# Patient Record
Sex: Female | Born: 1990 | Race: Black or African American | Hispanic: No | Smoking: Current every day smoker
Health system: Southern US, Community
[De-identification: ages and names within clinical notes are randomized; demographics above are authoritative.]

## PROBLEM LIST (undated history)

## (undated) DIAGNOSIS — F259 Schizoaffective disorder, unspecified: Secondary | ICD-10-CM

## (undated) DIAGNOSIS — D573 Sickle-cell trait: Secondary | ICD-10-CM

## (undated) DIAGNOSIS — F419 Anxiety disorder, unspecified: Secondary | ICD-10-CM

## (undated) DIAGNOSIS — R011 Cardiac murmur, unspecified: Secondary | ICD-10-CM

## (undated) DIAGNOSIS — F209 Schizophrenia, unspecified: Secondary | ICD-10-CM

## (undated) DIAGNOSIS — F909 Attention-deficit hyperactivity disorder, unspecified type: Secondary | ICD-10-CM

## (undated) DIAGNOSIS — B009 Herpesviral infection, unspecified: Secondary | ICD-10-CM

---
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
## 2010-03-10 ENCOUNTER — Inpatient Hospital Stay (HOSPITAL_COMMUNITY)
Admission: AD | Admit: 2010-03-10 | Discharge: 2010-03-12 | Payer: Self-pay | Source: Home / Self Care | Attending: Obstetrics and Gynecology | Admitting: Obstetrics and Gynecology

## 2010-03-29 ENCOUNTER — Emergency Department (HOSPITAL_COMMUNITY)
Admission: EM | Admit: 2010-03-29 | Discharge: 2010-03-29 | Payer: Self-pay | Source: Home / Self Care | Admitting: Emergency Medicine

## 2010-04-02 LAB — CULTURE, ROUTINE-ABSCESS

## 2010-05-20 LAB — CBC
HCT: 28.1 % — ABNORMAL LOW (ref 36.0–46.0)
HCT: 31.6 % — ABNORMAL LOW (ref 36.0–46.0)
Hemoglobin: 11.3 g/dL — ABNORMAL LOW (ref 12.0–15.0)
MCH: 30.7 pg (ref 26.0–34.0)
MCH: 31.3 pg (ref 26.0–34.0)
MCHC: 35.8 g/dL (ref 30.0–36.0)
MCV: 87.5 fL (ref 78.0–100.0)
MCV: 88.1 fL (ref 78.0–100.0)
Platelets: 174 10*3/uL (ref 150–400)
RDW: 13.6 % (ref 11.5–15.5)
RDW: 13.9 % (ref 11.5–15.5)
WBC: 15.7 10*3/uL — ABNORMAL HIGH (ref 4.0–10.5)
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
## 2010-05-28 LAB — URINALYSIS, ROUTINE W REFLEX MICROSCOPIC
Bilirubin Urine: NEGATIVE
Hgb urine dipstick: NEGATIVE
Ketones, ur: 40 mg/dL — AB
Nitrite: NEGATIVE
Specific Gravity, Urine: 1.016 (ref 1.005–1.030)
Urobilinogen, UA: 1 mg/dL (ref 0.0–1.0)
pH: 5.5 (ref 5.0–8.0)

## 2010-05-28 LAB — COMPREHENSIVE METABOLIC PANEL
BUN: 3 mg/dL — ABNORMAL LOW (ref 6–23)
CO2: 25 mEq/L (ref 19–32)
Calcium: 9.1 mg/dL (ref 8.4–10.5)
Chloride: 105 mEq/L (ref 96–112)
Creatinine, Ser: 0.74 mg/dL (ref 0.4–1.2)
GFR calc non Af Amer: >60 mL/min (ref >60)
Total Bilirubin: 0.6 mg/dL (ref 0.3–1.2)

## 2010-05-28 LAB — CBC
HCT: 39.3 % (ref 36.0–46.0)
MCHC: 33.7 g/dL (ref 30.0–36.0)
MCV: 93.7 fL (ref 78.0–100.0)
RBC: 4.2 MIL/uL (ref 3.87–5.11)
WBC: 7.4 10*3/uL (ref 4.0–10.5)

## 2010-05-28 LAB — DIFFERENTIAL
Basophils Absolute: 0 10*3/uL (ref 0.0–0.1)
Lymphocytes Relative: 11 % — ABNORMAL LOW (ref 12–46)
Lymphs Abs: 0.8 10*3/uL (ref 0.7–4.0)
Neutro Abs: 5.6 10*3/uL (ref 1.7–7.7)
Neutrophils Relative %: 76 % (ref 43–77)

## 2010-05-28 LAB — LIPASE, BLOOD: Lipase: 22 U/L (ref 11–59)

## 2010-05-28 LAB — HCG, QUANTITATIVE, PREGNANCY: hCG, Beta Chain, Quant, S: 241 m[IU]/mL — ABNORMAL HIGH (ref <5)

## 2010-06-03 LAB — CBC
Hemoglobin: 14 g/dL (ref 12.0–15.0)
MCV: 96.3 fL (ref 78.0–100.0)
RBC: 4.37 MIL/uL (ref 3.87–5.11)
WBC: 4.7 10*3/uL (ref 4.0–10.5)

## 2010-06-03 LAB — URINALYSIS, ROUTINE W REFLEX MICROSCOPIC
Glucose, UA: NEGATIVE mg/dL
Ketones, ur: NEGATIVE mg/dL
Nitrite: NEGATIVE
Protein, ur: NEGATIVE mg/dL
Urobilinogen, UA: 0.2 mg/dL (ref 0.0–1.0)

## 2010-06-03 LAB — COMPREHENSIVE METABOLIC PANEL
ALT: 16 U/L (ref 0–35)
AST: 20 U/L (ref 0–37)
CO2: 26 mEq/L (ref 19–32)
Chloride: 101 mEq/L (ref 96–112)
Creatinine, Ser: 0.65 mg/dL (ref 0.4–1.2)
GFR calc Af Amer: >60 mL/min (ref >60)
GFR calc non Af Amer: >60 mL/min (ref >60)
Sodium: 137 mEq/L (ref 135–145)
Total Bilirubin: 1 mg/dL (ref 0.3–1.2)

## 2010-06-03 LAB — DIFFERENTIAL
Basophils Absolute: 0 10*3/uL (ref 0.0–0.1)
Eosinophils Absolute: 0.1 10*3/uL (ref 0.0–0.7)
Eosinophils Relative: 1 % (ref 0–5)

## 2010-06-03 LAB — RAPID URINE DRUG SCREEN, HOSP PERFORMED: Benzodiazepines: NOT DETECTED

## 2010-06-03 LAB — LIPASE, BLOOD: Lipase: 19 U/L (ref 11–59)

## 2010-06-03 LAB — PREGNANCY, URINE: Preg Test, Ur: NEGATIVE

## 2010-12-02 LAB — URINALYSIS, ROUTINE W REFLEX MICROSCOPIC
Bilirubin Urine: NEGATIVE
Ketones, ur: NEGATIVE
Nitrite: NEGATIVE
Protein, ur: NEGATIVE
Urobilinogen, UA: 1

## 2010-12-04 LAB — POCT PREGNANCY, URINE
Operator id: 294501
Preg Test, Ur: NEGATIVE

## 2010-12-04 LAB — URINALYSIS, ROUTINE W REFLEX MICROSCOPIC
Bilirubin Urine: NEGATIVE
Glucose, UA: NEGATIVE
Ketones, ur: NEGATIVE
pH: 5.5

## 2010-12-04 LAB — URINE CULTURE: Colony Count: 30000

## 2010-12-10 LAB — GC/CHLAMYDIA PROBE AMP, GENITAL
Chlamydia, DNA Probe: NEGATIVE
GC Probe Amp, Genital: NEGATIVE

## 2011-04-09 ENCOUNTER — Encounter (HOSPITAL_COMMUNITY): Payer: Self-pay | Admitting: Emergency Medicine

## 2011-04-09 ENCOUNTER — Emergency Department (HOSPITAL_COMMUNITY)
Admission: EM | Admit: 2011-04-09 | Discharge: 2011-04-09 | Disposition: A | Discharge status: Home / Self Care | Payer: Medicaid Other | Attending: Emergency Medicine | Admitting: Emergency Medicine

## 2011-04-09 ENCOUNTER — Emergency Department (HOSPITAL_COMMUNITY): Payer: Medicaid Other

## 2011-04-09 DIAGNOSIS — N949 Unspecified condition associated with female genital organs and menstrual cycle: Secondary | ICD-10-CM | POA: Insufficient documentation

## 2011-04-09 DIAGNOSIS — R102 Pelvic and perineal pain: Secondary | ICD-10-CM

## 2011-04-09 DIAGNOSIS — F411 Generalized anxiety disorder: Secondary | ICD-10-CM | POA: Insufficient documentation

## 2011-04-09 DIAGNOSIS — R1032 Left lower quadrant pain: Secondary | ICD-10-CM | POA: Insufficient documentation

## 2011-04-09 DIAGNOSIS — F17200 Nicotine dependence, unspecified, uncomplicated: Secondary | ICD-10-CM | POA: Insufficient documentation

## 2011-04-09 DIAGNOSIS — B9689 Other specified bacterial agents as the cause of diseases classified elsewhere: Secondary | ICD-10-CM | POA: Insufficient documentation

## 2011-04-09 DIAGNOSIS — A499 Bacterial infection, unspecified: Secondary | ICD-10-CM | POA: Insufficient documentation

## 2011-04-09 DIAGNOSIS — N760 Acute vaginitis: Secondary | ICD-10-CM | POA: Insufficient documentation

## 2011-04-09 HISTORY — DX: Anxiety disorder, unspecified: F41.9

## 2011-04-09 LAB — WET PREP, GENITAL: Trich, Wet Prep: NONE SEEN

## 2011-04-09 LAB — CBC
HCT: 38.1 % (ref 36.0–46.0)
Hemoglobin: 13.4 g/dL (ref 12.0–15.0)
MCH: 30.9 pg (ref 26.0–34.0)
MCHC: 35.2 g/dL (ref 30.0–36.0)

## 2011-04-09 LAB — BASIC METABOLIC PANEL
BUN: 8 mg/dL (ref 6–23)
Chloride: 100 mEq/L (ref 96–112)
Creatinine, Ser: 0.68 mg/dL (ref 0.50–1.10)
GFR calc Af Amer: >90 mL/min (ref >90)
Glucose, Bld: 93 mg/dL (ref 70–99)

## 2011-04-09 LAB — DIFFERENTIAL
Basophils Relative: 0 % (ref 0–1)
Eosinophils Absolute: 0.2 10*3/uL (ref 0.0–0.7)
Monocytes Absolute: 0.8 10*3/uL (ref 0.1–1.0)
Monocytes Relative: 11 % (ref 3–12)
Neutro Abs: 3.1 10*3/uL (ref 1.7–7.7)

## 2011-04-09 MED ORDER — HYDROCODONE-ACETAMINOPHEN 5-325 MG PO TABS
1.0000 | ORAL_TABLET | Freq: Once | ORAL | Status: AC
  Administered 2011-04-09: 1 via ORAL
  Filled 2011-04-09: qty 1

## 2011-04-09 MED ORDER — HYDROCODONE-ACETAMINOPHEN 5-325 MG PO TABS: 1.0000 | ORAL_TABLET | ORAL | Status: AC | PRN

## 2011-04-09 MED ORDER — METRONIDAZOLE 500 MG PO TABS: 500.0000 mg | ORAL_TABLET | Freq: Two times a day (BID) | ORAL | Status: AC

## 2011-04-09 NOTE — ED Notes (Signed)
Pt states she thinks her IUD is in the wrong spot  Pt states she is having pain and she feels it scratching her  Pt states it hurts to walk and sit and move around  Sxs started today but she states it has never felt right since she got it

## 2011-04-09 NOTE — ED Provider Notes (Signed)
History     CSN: [id number]  Arrival date & time 04/09/11  0038   First MD Initiated Contact with Patient 04/09/11 0350      No chief complaint on file.   (Consider location/radiation/quality/duration/timing/severity/associated sxs/prior treatment) HPI Comments: Patient here with complaints that she "thinks her IUD is out of place" - she states that it was placed in January of 2012, states that she has had pelvic pain since it was placed and she feels like she can feel it poking her on the left side of her body - she states that she has not seen her GYN because everytime she calls there the doctor is not there so she never made an appointment.  She reports she is also have a foul smelling discharge that started about 2 weeks ago - she denies fever, chills, reports she is sexually active with same partner - denies nausea, vomiting,   Patient is a 21 y.o. female presenting with female genitourinary complaint. The history is provided by the patient. No language interpreter was used.  Female GU Problem Primary symptoms include discharge, pelvic pain and genital odor.  Primary symptoms include no dyspareunia, no genital lesions, no genital pain, no genital rash, no genital itching, no dysuria and no vaginal bleeding. There has been no fever. This is a chronic problem. The current episode started more than 1 week ago. The problem occurs constantly. The problem has not changed since onset.She is not pregnant. She has not missed her period. Her LMP was weeks ago. The patient's menstrual history has been regular. The discharge was white, thin and malodorous. Associated symptoms include abdominal pain. Pertinent negatives include no anorexia, no abdominal swelling, no constipation, no diarrhea, no nausea, no vomiting, no frequency, no light-headedness and no dizziness. She has tried nothing for the symptoms. The treatment provided no relief. Sexual activity: sexually active. She uses an IUD for contraception.     Past Medical History  Diagnosis Date  . Anxiety     History reviewed. No pertinent past surgical history.  Family History  Problem Relation Age of Onset  . Sickle cell anemia Mother     History  Substance Use Topics  . Smoking status: Current Everyday Smoker -- 1.0 packs/day    Types: Cigarettes  . Smokeless tobacco: Not on file  . Alcohol Use: No    OB History    Grav Para Term Preterm Abortions TAB SAB Ect Mult Living                  Review of Systems  Gastrointestinal: Positive for abdominal pain. Negative for nausea, vomiting, diarrhea, constipation and anorexia.  Genitourinary: Positive for pelvic pain. Negative for dysuria, frequency, vaginal bleeding and dyspareunia.  Neurological: Negative for dizziness and light-headedness.  All other systems reviewed and are negative.    Allergies  Review of patient's allergies indicates no known allergies.  Home Medications  No current outpatient prescriptions on file.  BP 120/74  Pulse 81  Temp(Src) 98.1 F (36.7 C) (Oral)  Resp 18  SpO2 100%  Physical Exam  Nursing note and vitals reviewed. Constitutional: She is oriented to person, place, and time. She appears well-developed and well-nourished. No distress.  HENT:  Head: Normocephalic and atraumatic.  Right Ear: External ear normal.  Left Ear: External ear normal.  Nose: Nose normal.  Mouth/Throat: Oropharynx is clear and moist. No oropharyngeal exudate.  Eyes: Conjunctivae are normal. Pupils are equal, round, and reactive to light. No scleral icterus.  Neck: Normal  range of motion. Neck supple.  Cardiovascular: Normal rate, regular rhythm and normal heart sounds.  Exam reveals no gallop and no friction rub.   No murmur heard. Pulmonary/Chest: Effort normal and breath sounds normal. She exhibits no tenderness.  Abdominal: Soft. Bowel sounds are normal. She exhibits no distension. There is no tenderness.  Genitourinary: Uterus normal. There is no rash,  tenderness or lesion on the right labia. There is no rash, tenderness or lesion on the left labia. Uterus is not enlarged and not tender. Cervix exhibits discharge. Cervix exhibits no motion tenderness and no friability. Right adnexum displays no mass and no tenderness. Left adnexum displays tenderness. Left adnexum displays no mass. No tenderness or bleeding around the vagina. Vaginal discharge found.       IUD string visible in the vaginal vault.  Musculoskeletal: Normal range of motion.  Lymphadenopathy:    She has no cervical adenopathy.  Neurological: She is alert and oriented to person, place, and time. No cranial nerve deficit.  Skin: Skin is warm and dry. No rash noted. No erythema. No pallor.  Psychiatric: She has a normal mood and affect. Her behavior is normal. Judgment and thought content normal.    ED Course  Procedures (including critical care time)  Labs Reviewed  DIFFERENTIAL - Abnormal; Notable for the following:    Neutrophils Relative 41 (*)    All other components within normal limits  WET PREP, GENITAL - Abnormal; Notable for the following:    Clue Cells Wet Prep HPF POC MODERATE (*)    WBC, Wet Prep HPF POC FEW (*)    All other components within normal limits  CBC  BASIC METABOLIC PANEL  GC/CHLAMYDIA PROBE AMP, GENITAL   US Transvaginal Non-ob  04/09/2011  *RADIOLOGY REPORT*  Clinical Data: Left lower quadrant pain.  Query placement of IUD.  TRANSABDOMINAL AND TRANSVAGINAL ULTRASOUND OF PELVIS Technique:  Both transabdominal and transvaginal ultrasound examinations of the pelvis were performed. Transabdominal technique was performed for global imaging of the pelvis including uterus, ovaries, adnexal regions, and pelvic cul-de-sac.  Comparison: 07/29/2007   It was necessary to proceed with endovaginal exam following the transabdominal exam to visualize the the ovaries and endometrium.  Findings:  Uterus: The uterus is anteverted and measures 8.9 x 4 x 5.5 cm.  No focal  myometrial masses.  Endometrium: Endometrial device appears to be appropriately positioned within endometrial cavity.  Endometrial stripe thickness is normal, measuring about 2 mm.  No abnormal endometrial fluid collections.  Right ovary:  Right ovary measures 4.2 x 2 x 2.2 cm and contains normal follicular changes.  Color flow Doppler images demonstrate flow in the right ovary.  Left ovary: The left ovary measures 3.8 x 3 x 3.3 cm.  Color flow Doppler images demonstrate flow in the left ovary.  Small amount of free fluid around the left adnexal region.  Other findings: Small amount of free fluid around the left adnexal region.  IMPRESSION: Intrauterine device in place.  Otherwise normal appearance of the uterus and ovaries.  Small amount of free fluid in the left adnexal region.  Original Report Authenticated By: Marlon Pel, M.D.   US Pelvis Complete  04/09/2011  *RADIOLOGY REPORT*  Clinical Data: Left lower quadrant pain.  Query placement of IUD.  TRANSABDOMINAL AND TRANSVAGINAL ULTRASOUND OF PELVIS Technique:  Both transabdominal and transvaginal ultrasound examinations of the pelvis were performed. Transabdominal technique was performed for global imaging of the pelvis including uterus, ovaries, adnexal regions, and pelvic cul-de-sac.  Comparison: 07/29/2007   It was necessary to proceed with endovaginal exam following the transabdominal exam to visualize the the ovaries and endometrium.  Findings:  Uterus: The uterus is anteverted and measures 8.9 x 4 x 5.5 cm.  No focal myometrial masses.  Endometrium: Endometrial device appears to be appropriately positioned within endometrial cavity.  Endometrial stripe thickness is normal, measuring about 2 mm.  No abnormal endometrial fluid collections.  Right ovary:  Right ovary measures 4.2 x 2 x 2.2 cm and contains normal follicular changes.  Color flow Doppler images demonstrate flow in the right ovary.  Left ovary: The left ovary measures 3.8 x 3 x 3.3 cm.   Color flow Doppler images demonstrate flow in the left ovary.  Small amount of free fluid around the left adnexal region.  Other findings: Small amount of free fluid around the left adnexal region.  IMPRESSION: Intrauterine device in place.  Otherwise normal appearance of the uterus and ovaries.  Small amount of free fluid in the left adnexal region.  Original Report Authenticated By: Marlon Pel, M.D.     Pelvic pain IUD in place BV   MDM  Patient with a year history of pelvic pain and recent vaginal discharge - I do not suspect PID in the patient and have confirmed placement of the IUD, have sent cultures as well, but again she was more tender at the left adnexa.  I have discharged her home with pain control, flagyl and for her to follow up with her GYN at the next available appointment/        Scarlette Calico C. [place], [place] 04/09/11 [phone]

## 2011-04-09 NOTE — ED Notes (Signed)
Patient transported to [facility] 

## 2011-04-09 NOTE — ED Provider Notes (Signed)
Medical screening examination/treatment/procedure(s) were performed by non-physician practitioner and as supervising physician I was immediately available for consultation/collaboration.  Loren Racer, MD 04/09/11 [phone]

## 2011-04-11 NOTE — ED Notes (Signed)
+   GC + Chlamydia Chart sent to EDP office for review.

## 2011-04-12 NOTE — ED Notes (Addendum)
Chart returned to from  EDP office. Azithromax 1g po x1 Suprax 400 mg po x1 Written by Purnell Shoemaker need to be called to pharmacy.

## 2011-04-14 NOTE — ED Notes (Signed)
Unable to contact via phone  Letter sent to [employer].

## 2011-04-18 NOTE — ED Notes (Signed)
Pt called and ID was verified x 2. Pt then given results and instructions.  Pt told me to call in Rx Azithromycin 1gram PO and Suprax 400mg  PO, both x 1 dose, to [facility] on [address] at [phone].  Pt verbalized understanding.

## 2011-05-24 ENCOUNTER — Emergency Department (HOSPITAL_COMMUNITY)
Admission: EM | Admit: 2011-05-24 | Discharge: 2011-05-25 | Disposition: A | Discharge status: Other Acute Inpatient Hospital | Payer: Medicaid Other | Attending: Emergency Medicine | Admitting: Emergency Medicine

## 2011-05-24 ENCOUNTER — Encounter (HOSPITAL_COMMUNITY): Payer: Self-pay | Admitting: *Deleted

## 2011-05-24 DIAGNOSIS — F17200 Nicotine dependence, unspecified, uncomplicated: Secondary | ICD-10-CM | POA: Insufficient documentation

## 2011-05-24 DIAGNOSIS — F29 Unspecified psychosis not due to a substance or known physiological condition: Secondary | ICD-10-CM

## 2011-05-24 DIAGNOSIS — F3289 Other specified depressive episodes: Secondary | ICD-10-CM | POA: Insufficient documentation

## 2011-05-24 DIAGNOSIS — F329 Major depressive disorder, single episode, unspecified: Secondary | ICD-10-CM | POA: Insufficient documentation

## 2011-05-24 DIAGNOSIS — F411 Generalized anxiety disorder: Secondary | ICD-10-CM | POA: Insufficient documentation

## 2011-05-24 LAB — CBC
HCT: 38.3 % (ref 36.0–46.0)
Hemoglobin: 13.9 g/dL (ref 12.0–15.0)
MCH: 31.9 pg (ref 26.0–34.0)
MCHC: 36.3 g/dL — ABNORMAL HIGH (ref 30.0–36.0)
MCV: 87.8 fL (ref 78.0–100.0)
RDW: 13.1 % (ref 11.5–15.5)

## 2011-05-24 LAB — ETHANOL: Alcohol, Ethyl (B): <11 mg/dL (ref 0–11)

## 2011-05-24 LAB — COMPREHENSIVE METABOLIC PANEL
ALT: 14 U/L (ref 0–35)
AST: 16 U/L (ref 0–37)
Albumin: 4.1 g/dL (ref 3.5–5.2)
Alkaline Phosphatase: 57 U/L (ref 39–117)
Glucose, Bld: 96 mg/dL (ref 70–99)
Potassium: 3.8 mEq/L (ref 3.5–5.1)
Sodium: 136 mEq/L (ref 135–145)
Total Protein: 7.3 g/dL (ref 6.0–8.3)

## 2011-05-24 LAB — RAPID URINE DRUG SCREEN, HOSP PERFORMED
Barbiturates: NOT DETECTED
Tetrahydrocannabinol: POSITIVE — AB

## 2011-05-24 NOTE — ED Provider Notes (Addendum)
History     CSN: [id number]  Arrival date & time 05/24/11  2033   First MD Initiated Contact with Patient 05/24/11 2116     Chief complaint: depressed, hearing voices   (Consider location/radiation/quality/duration/timing/severity/associated sxs/prior treatment) The history is provided by the patient.  pt states hx bipolar disorder and ptsd. States in past couple weeks hearing voices, 'lucifer' telling her to harm other people. Denies any definite plan or attempt at harm to self or others. States has been ver depressed. No si. No visual hallucinations. States otherwise, physical health at baseline. Denies etoh abuse. Pt states normal appetite. No nvd. No pain. No headaches. No fever or chills.   Past Medical History  Diagnosis Date  . Anxiety     No past surgical history on file.  Family History  Problem Relation Age of Onset  . Sickle cell anemia Mother     History  Substance Use Topics  . Smoking status: Current Everyday Smoker -- 1.0 packs/day    Types: Cigarettes  . Smokeless tobacco: Not on file  . Alcohol Use: No    OB History    Grav Para Term Preterm Abortions TAB SAB Ect Mult Living                  Review of Systems  Constitutional: Negative for fever.  HENT: Negative for neck pain.   Eyes: Negative for visual disturbance.  Respiratory: Negative for shortness of breath.   Cardiovascular: Negative for chest pain.  Gastrointestinal: Negative for abdominal pain.  Genitourinary: Negative for flank pain.  Musculoskeletal: Negative for back pain.  Skin: Negative for rash.  Neurological: Negative for headaches.  Hematological: Does not bruise/bleed easily.  Psychiatric/Behavioral: Negative for confusion.    Allergies  Review of patient's allergies indicates no known allergies.  Home Medications   Current Outpatient Rx  Name Route Sig Dispense Refill  . ACETAMINOPHEN 500 MG PO TABS Oral Take 500 mg by mouth every 6 (six) hours as needed. For pain    .  IBUPROFEN 200 MG PO CAPS Oral Take 200 mg by mouth 2 (two) times daily as needed. For cramps/pain      BP 115/73  Pulse 93  Temp(Src) 98.9 F (37.2 C) (Oral)  Resp 16  SpO2 100%  Physical Exam  Nursing note and vitals reviewed. Constitutional: She is oriented to person, place, and time. She appears well-developed and well-nourished. No distress.  HENT:  Head: Atraumatic.  Eyes: Conjunctivae are normal. Pupils are equal, round, and reactive to light. No scleral icterus.  Neck: Neck supple. No tracheal deviation present.  Cardiovascular: Normal rate, regular rhythm, normal heart sounds and intact distal pulses.   Pulmonary/Chest: Effort normal and breath sounds normal. No respiratory distress.  Abdominal: Soft. Normal appearance and bowel sounds are normal. She exhibits no distension. There is no tenderness.  Musculoskeletal: She exhibits no edema.  Neurological: She is alert and oriented to person, place, and time.       Steady gait. Motor intact bil  Skin: Skin is warm and dry. No rash noted.  Psychiatric:       Depressed mood. Flat affect. Admits to auditory hallucinations. No si.     ED Course  Procedures (including critical care time)   Labs Reviewed  COMPREHENSIVE METABOLIC PANEL  CBC  URINE RAPID DRUG SCREEN (HOSP PERFORMED)  ETHANOL  PREGNANCY, URINE    Results for orders placed during the hospital encounter of 05/24/11  COMPREHENSIVE METABOLIC PANEL  Component Value Range   Sodium 136  135 - 145 (mEq/L)   Potassium 3.8  3.5 - 5.1 (mEq/L)   Chloride 101  96 - 112 (mEq/L)   CO2 26  19 - 32 (mEq/L)   Glucose, Bld 96  70 - 99 (mg/dL)   BUN 7  6 - 23 (mg/dL)   Creatinine, Ser 1.61  0.50 - 1.10 (mg/dL)   Calcium 9.2  8.4 - 09.6 (mg/dL)   Total Protein 7.3  6.0 - 8.3 (g/dL)   Albumin 4.1  3.5 - 5.2 (g/dL)   AST 16  0 - 37 (U/L)   ALT 14  0 - 35 (U/L)   Alkaline Phosphatase 57  39 - 117 (U/L)   Total Bilirubin 1.1  0.3 - 1.2 (mg/dL)   GFR calc non Af Amer  >90  >90 (mL/min)   GFR calc Af Amer >90  >90 (mL/min)  CBC      Component Value Range   WBC 6.1  4.0 - 10.5 (K/uL)   RBC 4.36  3.87 - 5.11 (MIL/uL)   Hemoglobin 13.9  12.0 - 15.0 (g/dL)   HCT 04.5  40.9 - 81.1 (%)   MCV 87.8  78.0 - 100.0 (fL)   MCH 31.9  26.0 - 34.0 (pg)   MCHC 36.3 (*) 30.0 - 36.0 (g/dL)   RDW 91.4  78.2 - 95.6 (%)   Platelets 231  150 - 400 (K/uL)  URINE RAPID DRUG SCREEN (HOSP PERFORMED)      Component Value Range   Opiates NONE DETECTED  NONE DETECTED    Cocaine NONE DETECTED  NONE DETECTED    Benzodiazepines NONE DETECTED  NONE DETECTED    Amphetamines NONE DETECTED  NONE DETECTED    Tetrahydrocannabinol POSITIVE (*) NONE DETECTED    Barbiturates NONE DETECTED  NONE DETECTED   ETHANOL      Component Value Range   Alcohol, Ethyl (B) <11  0 - 11 (mg/dL)  PREGNANCY, URINE      Component Value Range   Preg Test, Ur NEGATIVE  NEGATIVE     MDM  Labs. Pt w security from [facility], they indicate pt to be taken back there once eval and med clearance labs back.    Nursing to fax labs to [facility], and contact them re accepting pt back to them for placement. Anticipate being able to d/c back to [facility] (via their security service who is with patient in the ED, pt cooperative/agreeable.   Charge rn has spoken with [facility] - they have accepted for return there. Will d/c to [facility] for psych placement, is medically stable/clear for discharge from ed.      Suzi Roots, MD 05/24/11 2130  Suzi Roots, MD 05/24/11 [phone]

## 2011-05-24 NOTE — ED Notes (Signed)
PT presents w/ IVC papers from [facility] for medical clearance. Pt pleasant, A&O x 3 at present time, c/o delusional behavior in 2 week cycles.

## 2011-05-25 NOTE — Discharge Instructions (Signed)
Return to [facility] via [facility] [facility].

## 2011-07-14 ENCOUNTER — Encounter (HOSPITAL_COMMUNITY): Payer: Self-pay | Admitting: Emergency Medicine

## 2011-07-14 ENCOUNTER — Emergency Department (HOSPITAL_COMMUNITY)
Admission: EM | Admit: 2011-07-14 | Discharge: 2011-07-15 | Payer: Medicaid Other | Attending: Emergency Medicine | Admitting: Emergency Medicine

## 2011-07-14 DIAGNOSIS — R443 Hallucinations, unspecified: Secondary | ICD-10-CM

## 2011-07-14 DIAGNOSIS — F411 Generalized anxiety disorder: Secondary | ICD-10-CM | POA: Insufficient documentation

## 2011-07-14 DIAGNOSIS — F29 Unspecified psychosis not due to a substance or known physiological condition: Secondary | ICD-10-CM | POA: Insufficient documentation

## 2011-07-14 DIAGNOSIS — F17200 Nicotine dependence, unspecified, uncomplicated: Secondary | ICD-10-CM | POA: Insufficient documentation

## 2011-07-14 DIAGNOSIS — R011 Cardiac murmur, unspecified: Secondary | ICD-10-CM | POA: Insufficient documentation

## 2011-07-14 DIAGNOSIS — D573 Sickle-cell trait: Secondary | ICD-10-CM | POA: Insufficient documentation

## 2011-07-14 HISTORY — DX: Cardiac murmur, unspecified: R01.1

## 2011-07-14 HISTORY — DX: Sickle-cell trait: D57.3

## 2011-07-14 NOTE — ED Notes (Signed)
Pt sent here from [facility] for medical clearance  Pt is to return to [facility] once cleared as she is waiting placement at [facility]

## 2011-07-15 NOTE — ED Notes (Signed)
Information faxed to [facility] and notified it had been sent

## 2011-07-15 NOTE — Discharge Instructions (Signed)
You have been evaluated today and cleared of any concerning medical conditions. Please followup at [facility] as planned for continued treatment of your hallucinations.   Hallucinations and Delusions You seem to be having hallucinations and/or delusions. You may be hearing voices that no one else can hear. This can seem very real to you. You may be having thoughts and fears that do not make sense to others. This condition can be due to mental disease like schizophrenia. It may be caused by a medical condition, such as an infection or electrolyte disturbance. These symptoms are also seen in drug abusers, especially those who use crack cocaine and amphetamines. Drugs like PCP, LSD, MDMA, peyote, and psilocybin can also cause frightening hallucinations and loss of control. If your symptoms are due to drug abuse, your mental state should improve as the drug(s) leave your system. Someone you trust should be with you until you are better to protect you and calm your fears. Often tranquilizers are very helpful at controlling hallucinations, anxiety, and destructive behavior. Getting a proper diet and enough sleep is important to recovery. If your symptoms are not due to drugs, or do not improve over several days after stopping drug use, you need further medical or mental health care. SEEK IMMEDIATE MEDICAL CARE IF:   Your symptoms get worse, especially if you think your life is in danger   You have violent or destructive thoughts.  Recovery is possible, but you have to get proper treatment and avoid drugs that are known to cause you trouble. Document Released: 04/03/2004 Document Revised: 02/13/2011 Document Reviewed: 02/24/2005 [facility] Patient Information 2012 [place], [place].   RESOURCE GUIDE  Dental Problems  Patients with Medicaid: [facility] [phone]                                           [id number] [employer] Phone:  [phone]                                                   Phone:  [phone]  If unable to pay or uninsured, contact:  [facility] or [facility]. to become qualified for the adult dental clinic.  Chronic Pain Problems Contact [facility]  [phone] Patients need to be referred by their primary care doctor.  Insufficient Money for Medicine Contact United Way:  call "211" or [facility] (470)446-0473.  No Primary Care Doctor Call [facility]  [phone] Other agencies that provide inexpensive medical care    [facility]  [phone]    [facility]  [phone]    [facility]  [phone]    [facility]  [phone]    Planned Parenthood  3104224744    [facility]  [phone]  Psychological Services [facility]  [phone] [facility]  [phone] [facility]   [phone] (emergency services [phone])  Substance Abuse Resources [facility]  651-722-1754 [facility] [phone] [facility] [place] [phone] Floydene Flock [phone] Residential & Outpatient Substance Abuse Program  [phone]  Abuse/Neglect [facility] Child Abuse Hotline (205)564-5605 [facility] Child Abuse Hotline 709-476-9520 (After Hours)  Emergency Shelter [facility] 9208484623  Maternity Homes [facility] [place] [facility] 989-144-0028 [facility] [phone]  MRSA Hotline #:   878-105-3301    [facility] Resources  [facility]     United Way                          [facility] Dept. [address] [place]                       [address]      [address]  Blondell Reveal Phone:  [phone]                                   Phone:  [phone]                 Phone:   [phone]  [facility] Phone:  [phone]  [facility] Child Abuse Hotline (848) 676-4994 (706)[phone] (After Hours)

## 2011-07-15 NOTE — ED Provider Notes (Signed)
Medical screening examination/treatment/procedure(s) were performed by non-physician practitioner and as supervising physician I was immediately available for consultation/collaboration.   Hanley Seamen, MD 07/15/11 2238

## 2011-07-15 NOTE — ED Provider Notes (Signed)
History     CSN: [id number]  Arrival date & time 07/14/11  2328   First MD Initiated Contact with Patient 07/15/11 0107      Chief Complaint  Patient presents with  . Medical Clearance   HPI  History provided by the patient and paperwork from [facility] Level V applies secondary to patient uncooperativeness. Patient is a 21 year old female with history of anxiety who presents for medical clearance. Patient was seen earlier today at [facility] for complaints of auditory and visual hallucinations. History is limited secondary to patient cooperation. Patient does state that she was hearing and seeing things and they were causing her to do "things that she wasn't supposed to do", but patient would not elaborate. Patient also stated that my questioning was causing her "blood to boil" and become more angry. Patient denies any other significant medical history.    Past Medical History  Diagnosis Date  . Anxiety   . Heart murmur   . Sickle cell trait     History reviewed. No pertinent past surgical history.  Family History  Problem Relation Age of Onset  . Sickle cell anemia Mother     History  Substance Use Topics  . Smoking status: Current Everyday Smoker -- 1.0 packs/day    Types: Cigarettes  . Smokeless tobacco: Not on file  . Alcohol Use: Yes     daily    OB History    Grav Para Term Preterm Abortions TAB SAB Ect Mult Living                  Review of Systems Level V applies secondary to patient being uncooperative   Allergies  Zyprexa  Home Medications   Current Outpatient Rx  Name Route Sig Dispense Refill  . ACETAMINOPHEN 500 MG PO TABS Oral Take 500 mg by mouth every 6 (six) hours as needed. For pain    . ARIPIPRAZOLE 5 MG PO TABS Oral Take 5 mg by mouth daily.    Marland Kitchen DIVALPROEX SODIUM 500 MG PO TBEC Oral Take 500-1,000 mg by mouth See admin instructions. Takes 500 mg in the morning and 1000 mg at bedtime.    . IBUPROFEN 200 MG PO CAPS Oral Take 200 mg by mouth 2  (two) times daily as needed. For cramps/pain      BP 99/45  Pulse 84  Temp(Src) 100.5 F (38.1 C) (Oral)  Resp 20  SpO2 100%  Physical Exam  Nursing note and vitals reviewed. Constitutional: She appears well-developed and well-nourished. No distress.  HENT:  Head: Normocephalic.  Cardiovascular: Normal rate and regular rhythm.   Pulmonary/Chest: Effort normal and breath sounds normal.  Musculoskeletal: Normal range of motion.  Neurological: She is alert.  Skin: Skin is warm.  Psychiatric: She is agitated.    ED Course  Procedures   Results for orders placed during the hospital encounter of 07/14/11  PREGNANCY, URINE      Component Value Range   Preg Test, Ur NEGATIVE  NEGATIVE   VALPROIC ACID LEVEL      Component Value Range   Valproic Acid Lvl 24.7 (*) 50.0 - 100.0 (ug/mL)       1. Psychosis   2. Hallucinations       MDM  Patient seen and evaluated. Patient no acute distress. Patient uncooperative with history exam.  Patient is accepted at [facility] and can return after lab work.     Date: 07/15/2011  Rate: 87  Rhythm: normal sinus rhythm  QRS Axis: normal  Intervals: normal  ST/T Wave abnormalities: normal  Conduction Disutrbances:none  Narrative Interpretation:   Old EKG Reviewed: none available    Angus Seller, [place] 07/15/11 2032

## 2012-12-11 ENCOUNTER — Encounter (HOSPITAL_COMMUNITY): Payer: Self-pay | Admitting: *Deleted

## 2012-12-11 ENCOUNTER — Emergency Department (HOSPITAL_COMMUNITY)
Admission: EM | Admit: 2012-12-11 | Discharge: 2012-12-11 | Disposition: A | Discharge status: Home / Self Care | Payer: Medicaid Other | Attending: Emergency Medicine | Admitting: Emergency Medicine

## 2012-12-11 DIAGNOSIS — R011 Cardiac murmur, unspecified: Secondary | ICD-10-CM | POA: Insufficient documentation

## 2012-12-11 DIAGNOSIS — Z79899 Other long term (current) drug therapy: Secondary | ICD-10-CM | POA: Insufficient documentation

## 2012-12-11 DIAGNOSIS — F411 Generalized anxiety disorder: Secondary | ICD-10-CM | POA: Insufficient documentation

## 2012-12-11 DIAGNOSIS — Z3202 Encounter for pregnancy test, result negative: Secondary | ICD-10-CM | POA: Insufficient documentation

## 2012-12-11 DIAGNOSIS — F17200 Nicotine dependence, unspecified, uncomplicated: Secondary | ICD-10-CM | POA: Insufficient documentation

## 2012-12-11 DIAGNOSIS — T7421XA Adult sexual abuse, confirmed, initial encounter: Secondary | ICD-10-CM | POA: Insufficient documentation

## 2012-12-11 DIAGNOSIS — F259 Schizoaffective disorder, unspecified: Secondary | ICD-10-CM | POA: Insufficient documentation

## 2012-12-11 DIAGNOSIS — Z862 Personal history of diseases of the blood and blood-forming organs and certain disorders involving the immune mechanism: Secondary | ICD-10-CM | POA: Insufficient documentation

## 2012-12-11 HISTORY — DX: Schizoaffective disorder, unspecified: F25.9

## 2012-12-11 HISTORY — DX: Attention-deficit hyperactivity disorder, unspecified type: F90.9

## 2012-12-11 LAB — URINALYSIS, ROUTINE W REFLEX MICROSCOPIC
Bilirubin Urine: NEGATIVE
Specific Gravity, Urine: 1.011 (ref 1.005–1.030)
pH: 6.5 (ref 5.0–8.0)

## 2012-12-11 LAB — URINE MICROSCOPIC-ADD ON

## 2012-12-11 MED ORDER — LIDOCAINE HCL (PF) 1 % IJ SOLN
INTRAMUSCULAR | Status: AC
  Administered 2012-12-11: 0.9 mL
  Filled 2012-12-11: qty 5

## 2012-12-11 MED ORDER — CEFTRIAXONE SODIUM 250 MG IJ SOLR
250.0000 mg | Freq: Once | INTRAMUSCULAR | Status: AC
  Administered 2012-12-11: 250 mg via INTRAMUSCULAR
  Filled 2012-12-11: qty 250

## 2012-12-11 MED ORDER — AZITHROMYCIN 1 G PO PACK
1.0000 g | PACK | Freq: Once | ORAL | Status: AC
  Administered 2012-12-11: 1 g via ORAL
  Filled 2012-12-11: qty 1

## 2012-12-11 MED ORDER — AZITHROMYCIN 250 MG PO TABS
1000.0000 mg | ORAL_TABLET | Freq: Once | ORAL | Status: AC
  Administered 2012-12-11: 1000 mg via ORAL
  Filled 2012-12-11: qty 4

## 2012-12-11 NOTE — ED Notes (Signed)
Urine never collected. Accidentally clicked off.
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
## 2012-12-11 NOTE — ED Notes (Signed)
Pt. Had to use the restroom was unable to waite. She was told we need a urinalysis, and given a cup to go in. When she came back from the restroom she clamed she forgot to go in the cup.

## 2012-12-11 NOTE — ED Notes (Signed)
Sane Nurse called to examine the patinet.

## 2012-12-11 NOTE — ED Notes (Signed)
Pt signed her name Kahley Leib, reports "That's my stage name." "That's what my name gonna be." Discharge instructions reviewed with family at bedside, family verbalizes understanding.
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
## 2012-12-12 ENCOUNTER — Ambulatory Visit (HOSPITAL_COMMUNITY)
Admission: EM | Admit: 2012-12-12 | Discharge: 2012-12-12 | Disposition: A | Discharge status: Home / Self Care | Payer: Medicare Other | Attending: Psychiatry | Admitting: Psychiatry

## 2012-12-12 ENCOUNTER — Encounter (HOSPITAL_COMMUNITY): Payer: Self-pay | Admitting: Emergency Medicine

## 2012-12-12 ENCOUNTER — Emergency Department (HOSPITAL_COMMUNITY)
Admission: EM | Admit: 2012-12-12 | Discharge: 2012-12-22 | Disposition: A | Discharge status: Nursing Home (Includes ICF) | Payer: Medicaid Other | Attending: Emergency Medicine | Admitting: Emergency Medicine

## 2012-12-12 DIAGNOSIS — Z862 Personal history of diseases of the blood and blood-forming organs and certain disorders involving the immune mechanism: Secondary | ICD-10-CM | POA: Insufficient documentation

## 2012-12-12 DIAGNOSIS — F22 Delusional disorders: Secondary | ICD-10-CM | POA: Insufficient documentation

## 2012-12-12 DIAGNOSIS — R011 Cardiac murmur, unspecified: Secondary | ICD-10-CM | POA: Insufficient documentation

## 2012-12-12 DIAGNOSIS — F29 Unspecified psychosis not due to a substance or known physiological condition: Secondary | ICD-10-CM | POA: Insufficient documentation

## 2012-12-12 DIAGNOSIS — F259 Schizoaffective disorder, unspecified: Secondary | ICD-10-CM | POA: Insufficient documentation

## 2012-12-12 DIAGNOSIS — Z3202 Encounter for pregnancy test, result negative: Secondary | ICD-10-CM | POA: Insufficient documentation

## 2012-12-12 DIAGNOSIS — F17200 Nicotine dependence, unspecified, uncomplicated: Secondary | ICD-10-CM | POA: Insufficient documentation

## 2012-12-12 DIAGNOSIS — Z79899 Other long term (current) drug therapy: Secondary | ICD-10-CM | POA: Insufficient documentation

## 2012-12-12 DIAGNOSIS — R443 Hallucinations, unspecified: Secondary | ICD-10-CM

## 2012-12-12 DIAGNOSIS — F309 Manic episode, unspecified: Secondary | ICD-10-CM | POA: Diagnosis present

## 2012-12-12 LAB — ACETAMINOPHEN LEVEL: Acetaminophen (Tylenol), Serum: <15 ug/mL (ref 10–30)

## 2012-12-12 LAB — COMPREHENSIVE METABOLIC PANEL
Albumin: 3.6 g/dL (ref 3.5–5.2)
Alkaline Phosphatase: 94 U/L (ref 39–117)
BUN: 5 mg/dL — ABNORMAL LOW (ref 6–23)
Chloride: 98 mEq/L (ref 96–112)
Glucose, Bld: 102 mg/dL — ABNORMAL HIGH (ref 70–99)
Potassium: 3.2 mEq/L — ABNORMAL LOW (ref 3.5–5.1)
Total Bilirubin: 0.8 mg/dL (ref 0.3–1.2)

## 2012-12-12 LAB — ETHANOL: Alcohol, Ethyl (B): <11 mg/dL (ref 0–11)

## 2012-12-12 LAB — CBC
HCT: 37.4 % (ref 36.0–46.0)
Hemoglobin: 13.2 g/dL (ref 12.0–15.0)
RBC: 4.28 MIL/uL (ref 3.87–5.11)
RDW: 13.3 % (ref 11.5–15.5)
WBC: 7.1 10*3/uL (ref 4.0–10.5)

## 2012-12-12 MED ORDER — LORAZEPAM 1 MG PO TABS
1.0000 mg | ORAL_TABLET | Freq: Three times a day (TID) | ORAL | Status: DC | PRN
  Administered 2012-12-12 – 2012-12-18 (×11): 1 mg via ORAL
  Filled 2012-12-12 (×3): qty 1
  Filled 2012-12-12: qty 2
  Filled 2012-12-12 (×7): qty 1

## 2012-12-12 MED ORDER — ONDANSETRON HCL 4 MG PO TABS
4.0000 mg | ORAL_TABLET | Freq: Three times a day (TID) | ORAL | Status: DC | PRN
  Administered 2012-12-15 – 2012-12-19 (×2): 4 mg via ORAL
  Filled 2012-12-12 (×2): qty 1

## 2012-12-12 MED ORDER — ZOLPIDEM TARTRATE 5 MG PO TABS: 5.0000 mg | ORAL_TABLET | Freq: Every evening | ORAL | Status: DC | PRN

## 2012-12-12 MED ORDER — ARIPIPRAZOLE 5 MG PO TABS
5.0000 mg | ORAL_TABLET | Freq: Every day | ORAL | Status: DC
  Administered 2012-12-13 – 2012-12-18 (×6): 5 mg via ORAL
  Filled 2012-12-12 (×7): qty 1

## 2012-12-12 MED ORDER — ACETAMINOPHEN 325 MG PO TABS
650.0000 mg | ORAL_TABLET | ORAL | Status: DC | PRN
  Administered 2012-12-18 – 2012-12-21 (×5): 650 mg via ORAL
  Filled 2012-12-12 (×5): qty 2
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
## 2012-12-12 NOTE — ED Notes (Signed)
Pt walked to BR, pt removed clothing but became very preoccupied with "bombs" in the wall. Pt unable to follow instructions and give urine sample or put on paper scrubs. Pt led back to room 18 naked, pt then was able to get into scrubs. 1 pr blue jeans, 1 shirt, 1 pr black sunglasses and 1 white chain necklace

## 2012-12-13 DIAGNOSIS — F259 Schizoaffective disorder, unspecified: Secondary | ICD-10-CM

## 2012-12-13 DIAGNOSIS — F29 Unspecified psychosis not due to a substance or known physiological condition: Secondary | ICD-10-CM

## 2012-12-13 LAB — HEPATITIS PANEL, ACUTE
HCV Ab: NEGATIVE
Hep A IgM: NEGATIVE
Hep B C IgM: NEGATIVE
Hepatitis B Surface Ag: NEGATIVE

## 2012-12-13 LAB — URINE CULTURE

## 2012-12-13 LAB — RAPID URINE DRUG SCREEN, HOSP PERFORMED
Barbiturates: NOT DETECTED
Benzodiazepines: NOT DETECTED

## 2012-12-13 MED ORDER — POTASSIUM CHLORIDE CRYS ER 20 MEQ PO TBCR
40.0000 meq | EXTENDED_RELEASE_TABLET | Freq: Once | ORAL | Status: AC
  Administered 2012-12-13: 40 meq via ORAL
  Filled 2012-12-13: qty 2

## 2012-12-13 MED ORDER — DIPHENHYDRAMINE HCL 50 MG/ML IJ SOLN
50.0000 mg | Freq: Once | INTRAMUSCULAR | Status: AC
  Administered 2012-12-13: 50 mg via INTRAMUSCULAR
  Filled 2012-12-13: qty 1

## 2012-12-13 MED ORDER — LORAZEPAM 2 MG/ML IJ SOLN
2.0000 mg | Freq: Once | INTRAMUSCULAR | Status: AC
  Administered 2012-12-13: 2 mg via INTRAMUSCULAR
  Filled 2012-12-13: qty 1

## 2012-12-13 MED ORDER — ZIPRASIDONE MESYLATE 20 MG IM SOLR
10.0000 mg | Freq: Once | INTRAMUSCULAR | Status: AC
  Administered 2012-12-13: 10 mg via INTRAMUSCULAR
  Filled 2012-12-13: qty 20
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
## 2012-12-13 NOTE — BH Assessment (Addendum)
Per [facility]HT, Cyndie Mull cell [phone] office [phone]. Tobi Bastos is her Public affairs consultant.   Evette Cristal, [place] Assessment Counselor   Pt signed consent to release info form for Cyndie Mull and four of pt's friends and relatives. CSW will contact Tobi Bastos for collateral info.  Evette Cristal, [place] Assessment Counselor
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
## 2012-12-13 NOTE — Progress Notes (Signed)
Jamie, intake at [facility], denied pt due to aggressive behaviors.  Blain Pais, MHT/NS

## 2012-12-13 NOTE — Progress Notes (Signed)
Per TTS, patient signed consent to release information to Cyndie Mull. CSW spoke with Cyndie Mull who is her therapist from [facility], and is currently pt therapist. Per Ms. Caralee Ates, she assessed pt and brought her into the ED on a crisis call. Per Ms. Caralee Ates pt claimed to have been introduced to crack coaine, however UDS is only positive for marijuana. Per Ms. Caralee Ates pt is currently working with the substance abuse counselor at [facility]. Per Ms. Andrews patient has been living independently for the past 5 months, however is not sure when she left [facility].  Per Ms. Andrews patient has never exhibited these psychotic features with her within the past 5 months.   Catha Gosselin, LCSW [phone]  ED CSW .12/13/2012 1051am

## 2012-12-13 NOTE — Progress Notes (Signed)
Underwriter initiated bed placement by faxing off the following hospitals referrals including facesheet, labs, notes, assessment and IVC on behalf of the pt: 1)[facility] 3)[facility] 4)[facility] 5)[facility] 6)[facility]  Blain Pais, MHT/NS

## 2012-12-13 NOTE — Progress Notes (Signed)
Pt declined by [facility].  Naveen Clardy L Nathanael Krist, MHT/NS 

## 2012-12-13 NOTE — Progress Notes (Signed)
Betty Parrish, MHT completing follow up with previous placement search efforts. Writer contacted [facility] who reports receiving referral but without psych assessment. Writer resubmitted referral packet with psych assessment to be reviewed at 9:30 pm 12/13/12.
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
## 2012-12-13 NOTE — Consult Note (Signed)
Agree with plan 

## 2012-12-13 NOTE — Progress Notes (Signed)
[facility] advised that pt is out of age range for Medicaid and out of [place].  If accepted here she would be self pay.  Blain Pais, MHT/NS

## 2012-12-13 NOTE — ED Provider Notes (Signed)
Medical screening examination/treatment/procedure(s) were conducted as a shared visit with non-physician practitioner(s) and myself.  I personally evaluated the patient during the encounter Please see my previous note.  Laray Anger, DO 12/13/12 1234

## 2012-12-14 ENCOUNTER — Other Ambulatory Visit: Payer: Self-pay

## 2012-12-14 ENCOUNTER — Encounter (HOSPITAL_COMMUNITY): Payer: Self-pay | Admitting: Registered Nurse

## 2012-12-14 MED ORDER — SULFAMETHOXAZOLE-TMP DS 800-160 MG PO TABS
1.0000 | ORAL_TABLET | Freq: Two times a day (BID) | ORAL | Status: AC
  Administered 2012-12-14 – 2012-12-16 (×6): 1 via ORAL
  Filled 2012-12-14 (×6): qty 1

## 2012-12-14 MED ORDER — ZIPRASIDONE MESYLATE 20 MG IM SOLR
10.0000 mg | Freq: Once | INTRAMUSCULAR | Status: AC
  Administered 2012-12-14: 10 mg via INTRAMUSCULAR
  Filled 2012-12-14: qty 20

## 2012-12-14 MED ORDER — ZIPRASIDONE MESYLATE 20 MG IM SOLR
10.0000 mg | Freq: Once | INTRAMUSCULAR | Status: AC
  Administered 2012-12-14: 10 mg via INTRAMUSCULAR

## 2012-12-14 MED ORDER — ZIPRASIDONE MESYLATE 20 MG IM SOLR: 10.0000 mg | Freq: Once | INTRAMUSCULAR | Status: AC | PRN

## 2012-12-14 MED ORDER — ZIPRASIDONE MESYLATE 20 MG IM SOLR
INTRAMUSCULAR | Status: AC
  Administered 2012-12-14: 18:00:00
  Filled 2012-12-14: qty 20

## 2012-12-14 NOTE — ED Provider Notes (Signed)
Medical screening examination/treatment/procedure(s) were conducted as a shared visit with non-physician practitioner(s) and myself.  I personally evaluated the patient during the encounter Please see my previous note.  Laray Anger, DO 12/14/12 1147

## 2012-12-14 NOTE — [facility] Counselor (Signed)
Writer consulted with Dr. Ladona Ridgel and the NP, [facility] regarding the patient still meeting criteria for inpatient hospitalization.    Writer has spoke to Chip Boer) at [facility] and confirmed that the patient is still on the wait list.  Writer informed the nurse working with the patient that the patients disposition remains pending placement at [facility].    The patient patient has also been referred to [facility] [phone].  Writer was informed by the intake coordinator (Amy) that the doctor has not reviewed the referral.     Writer left a voice mail message with Agustin Cree at [facility] [phone] regarding the status of the referral that was faxed.     Writer was informed by the nurse at [facility] that I needed to speak to Tiffany who is at lunch presently.  Writer will call her back at [phone] in order to determine the status of the referral that was sent yesterday.    Writer left a voice mail message regarding the status of the patients placement at [facility] [phone].

## 2012-12-14 NOTE — ED Provider Notes (Signed)
EKG - normal sinus rhythm, rate 91, normal axis, normal intervals, QTC 435, no ST or T wave changes.  Candyce Churn, MD 12/14/12 [phone]
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
## 2012-12-14 NOTE — ED Notes (Signed)
Pt. Continues to esculate both verbally/physically, notified EDP, order given for prn Geodon, IM.

## 2012-12-14 NOTE — Consult Note (Signed)
Note reviewed and agreed with  

## 2012-12-14 NOTE — Treatment Plan (Signed)
After re-running pt by [facility] medicine and admin for possible admission to [facility] it was determined that based on pt's threatening behavior and language that pt should remain on the [facility] waiting list.

## 2012-12-14 NOTE — ED Notes (Signed)
Pt. In room, states that when the room is darkened, Rhianna comes into her soul and does bad stuff.  Turned all lights on in pt.'s room, pt. Also given prn medication.

## 2012-12-14 NOTE — Progress Notes (Signed)
B.Eliyanna Ault, MHT continued placement search efforts by seeking authorization with [facility] to complete [facility] referral. Writer obtained authorization number from [place] at [facility] # [id number]. Writer contacted [facility] to submit referral which has been faxed. Writer will follow up with [facility] on today at 3pm.

## 2012-12-14 NOTE — ED Notes (Signed)
Pt. Has new order for bactrim and an EDG, pt. Given IM Geodon at 0948 and pt. Is asleep, will give medication/EKG when she is awake.

## 2012-12-15 MED ORDER — ZIPRASIDONE MESYLATE 20 MG IM SOLR
10.0000 mg | Freq: Once | INTRAMUSCULAR | Status: AC
  Administered 2012-12-15: 10 mg via INTRAMUSCULAR
  Filled 2012-12-15: qty 20

## 2012-12-15 NOTE — ED Notes (Signed)
Pt rambling, constantly coming to nurses station, laughing & talking loudly.  NP Shuvon notified.  Meds given.
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
## 2012-12-15 NOTE — ED Notes (Signed)
Pt escalating, cursing, talking loud, stating she wants to speak with physician.  States she has not spoken with MD during her stay here.  Pt informed that she has spoken with Psychiatrist during her stay here.  NP Shuvon notified.  Meds given.

## 2012-12-16 NOTE — BH Assessment (Signed)
[facility] Assessment Progress Note      Confirmed pt on [facility] list per Ann at 2105

## 2012-12-16 NOTE — BH Assessment (Signed)
Per Nina, pt still on [facility] wait list.  Betty Parrish Betty Parrish, LCSWA Assessment Counselor  

## 2012-12-16 NOTE — Consult Note (Signed)
  Psychiatric Specialty Exam: Physical Exam  ROS  Blood pressure 120/83, pulse 107, temperature 97.9 F (36.6 C), temperature source Oral, resp. rate 16, SpO2 96.00%.There is no height or weight on file to calculate BMI.  General Appearance: Casual  Eye Contact::  Good  Speech:  Pressured  Volume:  Increased  Mood:  Euphoric  Affect:  overly exuberant  Thought Process:  Tangential  Orientation:  Full (Time, Place, and Person)  Thought Content:  Negative  Suicidal Thoughts:  No  Homicidal Thoughts:  No  Memory:  Immediate;   Good Recent;   Good Remote;   Good  Judgement:  Impaired  Insight:  Lacking  Psychomotor Activity:  Increased  Concentration:  Fair  Recall:  Good  Akathisia:  Negative  Handed:  Right  AIMS (if indicated):     Assets:  Communication Skills  Sleep:   adequate   Ms Kneale remains manic.  Constantly talking, insistent that somebody be around to listen to her.  Constantly asking everyone who passes her to tell me she wants to talk to me.  Abilify was increased and will be increased again tomorrow. Still awaiting an inpatient bed.

## 2012-12-17 MED ORDER — NICOTINE 7 MG/24HR TD PT24
7.0000 mg | MEDICATED_PATCH | Freq: Once | TRANSDERMAL | Status: AC
  Administered 2012-12-17: 7 mg via TRANSDERMAL
  Filled 2012-12-17: qty 1

## 2012-12-17 MED ORDER — ZIPRASIDONE MESYLATE 20 MG IM SOLR
10.0000 mg | Freq: Once | INTRAMUSCULAR | Status: AC
  Administered 2012-12-17: 10 mg via INTRAMUSCULAR
  Filled 2012-12-17: qty 20

## 2012-12-17 MED ORDER — DIPHENHYDRAMINE HCL 50 MG/ML IJ SOLN
50.0000 mg | Freq: Once | INTRAMUSCULAR | Status: AC
  Administered 2012-12-17: 50 mg via INTRAMUSCULAR
  Filled 2012-12-17: qty 1
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
## 2012-12-17 NOTE — Progress Notes (Signed)
This underwriter confirmed with Betty Parrish at [facility] that pt is on wait list.  Blain Pais, MHT/NS
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
## 2012-12-18 ENCOUNTER — Encounter (HOSPITAL_COMMUNITY): Payer: Self-pay | Admitting: Psychiatry

## 2012-12-18 DIAGNOSIS — F259 Schizoaffective disorder, unspecified: Secondary | ICD-10-CM | POA: Diagnosis present

## 2012-12-18 DIAGNOSIS — F309 Manic episode, unspecified: Secondary | ICD-10-CM | POA: Diagnosis present

## 2012-12-18 MED ORDER — LORAZEPAM 1 MG PO TABS
1.0000 mg | ORAL_TABLET | Freq: Three times a day (TID) | ORAL | Status: DC
  Administered 2012-12-18 – 2012-12-22 (×10): 1 mg via ORAL
  Filled 2012-12-18 (×11): qty 1

## 2012-12-18 MED ORDER — TRAZODONE HCL 50 MG PO TABS
50.0000 mg | ORAL_TABLET | Freq: Every evening | ORAL | Status: DC | PRN
  Administered 2012-12-19: 50 mg via ORAL
  Filled 2012-12-18 (×2): qty 1

## 2012-12-18 MED ORDER — ARIPIPRAZOLE 10 MG PO TABS
10.0000 mg | ORAL_TABLET | Freq: Every day | ORAL | Status: DC
  Administered 2012-12-19 – 2012-12-20 (×2): 10 mg via ORAL
  Filled 2012-12-18 (×2): qty 1

## 2012-12-18 MED ORDER — GUAIFENESIN ER 600 MG PO TB12
600.0000 mg | ORAL_TABLET | Freq: Two times a day (BID) | ORAL | Status: DC | PRN
  Administered 2012-12-18 – 2012-12-19 (×2): 600 mg via ORAL
  Filled 2012-12-18 (×2): qty 1

## 2012-12-18 MED ORDER — ALUM & MAG HYDROXIDE-SIMETH 200-200-20 MG/5ML PO SUSP
30.0000 mL | Freq: Three times a day (TID) | ORAL | Status: DC | PRN
  Filled 2012-12-18: qty 30

## 2012-12-18 MED ORDER — NICOTINE 7 MG/24HR TD PT24
7.0000 mg | MEDICATED_PATCH | Freq: Every day | TRANSDERMAL | Status: DC
  Administered 2012-12-18 – 2012-12-19 (×2): 7 mg via TRANSDERMAL
  Filled 2012-12-18 (×2): qty 1

## 2012-12-18 MED ORDER — ARIPIPRAZOLE 5 MG PO TABS
5.0000 mg | ORAL_TABLET | Freq: Once | ORAL | Status: AC
  Administered 2012-12-18: 5 mg via ORAL
  Filled 2012-12-18: qty 1

## 2012-12-18 NOTE — ED Notes (Addendum)
C/o stomach ache and knot since eating salad. NAD, abd soft, pain localized to epigastric area, no knot noted on palpation. Pos BS x4 quads. Will notify EDP

## 2012-12-18 NOTE — ED Notes (Addendum)
Up to the bathroom to shower and changes scrubs.  Pt pleasant, cooperative, but labile at times,  Talking about people disrespecting her, remains easily redirectable.

## 2012-12-18 NOTE — ED Notes (Signed)
Catha Nottingham PA and Dr Elsie Saas into see

## 2012-12-18 NOTE — ED Notes (Signed)
Up to the bathroom, has eaten supper, epigastric pain has almost resolved, declined maalox at this time.

## 2012-12-18 NOTE — ED Notes (Signed)
Patient presents cooperative but slightly anxious; complaining of a stuffy nose. Patient denies any thoughts of self harm or thoughts of harming anyone else. Denies any current pain.

## 2012-12-18 NOTE — ED Notes (Signed)
Lunch tray delayed, sandwich/juice given

## 2012-12-18 NOTE — ED Notes (Signed)
Sitting quietly in room, drinking broth/watching tv, NAD

## 2012-12-18 NOTE — ED Notes (Signed)
Dr J into see 

## 2012-12-18 NOTE — ED Notes (Signed)
Dr Shela Commons and Catha Nottingham on w/ pt

## 2012-12-18 NOTE — ED Notes (Signed)
Up to the bathroom 

## 2012-12-18 NOTE — ED Notes (Signed)
Sitting on bed, reading

## 2012-12-18 NOTE — ED Notes (Addendum)
Up set that she is not leaving today, tearful.   Pt reports that she is concerned that she is going to lose custody of her 32yr old daughter if she does not leave today.

## 2012-12-18 NOTE — ED Notes (Signed)
Up on the phone 

## 2012-12-18 NOTE — ED Notes (Signed)
Up in the [place] talking w/ other patient

## 2012-12-18 NOTE — ED Notes (Signed)
Talking w/ mHt 

## 2012-12-18 NOTE — Progress Notes (Signed)
Per Elonda Husky, pt is still on [facility] wait list.  Mariann Laster, [phone]     ED CSW  4:00pm

## 2012-12-18 NOTE — ED Notes (Signed)
Up in the [place], talkative, pleasant, redirectable

## 2012-12-18 NOTE — Consult Note (Signed)
[facility] Face-to-Face Psychiatry Consult   Reason for Consult:  Mania Referring Physician:  ER MD Betty Parrish is an 22 y.o. female.  Assessment: AXIS I:  ADHD, hyperactive type, Anxiety Disorder NOS and Schizoaffective Disorder AXIS II:  Deferred AXIS III:   Past Medical History  Diagnosis Date  . Anxiety   . Heart murmur   . Sickle cell trait   . Schizo-affective psychosis   . ADHD (attention deficit hyperactivity disorder)    AXIS IV:  economic problems, housing problems, other psychosocial or environmental problems, problems related to social environment and problems with primary support group AXIS V:  41-50 serious symptoms  Plan:  Recommend psychiatric Inpatient admission when medically cleared.  Subjective:   Betty Parrish is a 22 y.o. female patient admitted with mania, on the wait list to [facility].  HPI:  Patient remains manic, tangential, labile.  Her mood has improved and can answer questions appropriately at times.  She does understand she has a mental illness but does not want to take oral antipsychotics due to weight gain, does not mind the injection.  Patient appears well groomed today and taking care of her ADLs HPI Elements:   Location:  generalized. Quality:  acute. Severity:  severe. Timing:  constant. Duration:  past few weeks. Context:  stopped taking her oral anti-psychotic medications.  Past Psychiatric History: Past Medical History  Diagnosis Date  . Anxiety   . Heart murmur   . Sickle cell trait   . Schizo-affective psychosis   . ADHD (attention deficit hyperactivity disorder)     reports that she has been smoking Cigarettes.  She has been smoking about 1.00 pack per day. She does not have any smokeless tobacco history on file. She reports that she drinks alcohol. She reports that she uses illicit drugs (Marijuana). Family History  Problem Relation Age of Onset  . Sickle cell anemia Mother            Allergies:   Allergies  Allergen Reactions  .  Zyprexa [Olanzapine] Other (See Comments)    Reaction is unknown    ACT Assessment Complete:  Yes:    Educational Status    Risk to Self: Risk to self Is patient at risk for suicide?: No, but patient needs Medical Clearance Substance abuse history and/or treatment for substance abuse?: No  Risk to Others:    Abuse:    Prior Inpatient Therapy:    Prior Outpatient Therapy:    Additional Information:     Objective: Blood pressure 116/63, pulse 60, temperature 97.6 F (36.4 C), temperature source Oral, resp. rate 20, SpO2 99.00%.There is no height or weight on file to calculate BMI.No results found for this or any previous visit (from the past 72 hour(s)). Labs are reviewed and are pertinent for no medical issues.  Current Facility-Administered Medications  Medication Dose Route Frequency Provider Last Rate Last Dose  . acetaminophen (TYLENOL) tablet 650 mg  650 mg Oral Q4H PRN Marissa Sciacca, PA-C      . [START ON 12/19/2012] ARIPiprazole (ABILIFY) tablet 10 mg  10 mg Oral Daily Nanine Means, NP      . ARIPiprazole (ABILIFY) tablet 5 mg  5 mg Oral Once Nanine Means, NP      . LORazepam (ATIVAN) tablet 1 mg  1 mg Oral Q8H PRN Marissa Sciacca, PA-C   1 mg at 12/18/12 0958  . nicotine (NICODERM CQ - dosed in mg/24 hr) patch 7 mg  7 mg Transdermal Once Flint Melter, MD  7 mg at 12/17/12 1853  . ondansetron (ZOFRAN) tablet 4 mg  4 mg Oral Q8H PRN Marissa Sciacca, PA-C   4 mg at 12/15/12 2225   Current Outpatient Prescriptions  Medication Sig Dispense Refill  . ARIPiprazole (ABILIFY MAINTENA) 300 MG SUSR Inject 300 mg into the muscle every 30 (thirty) days.      . ARIPiprazole (ABILIFY) 5 MG tablet Take 5 mg by mouth daily.      Marland Kitchen tetrahydrozoline 0.05 % ophthalmic solution Place 2 drops into both eyes daily as needed (for dry eyes).        Psychiatric Specialty Exam:     Blood pressure 116/63, pulse 60, temperature 97.6 F (36.4 C), temperature source Oral, resp. rate 20, SpO2  99.00%.There is no height or weight on file to calculate BMI.  General Appearance: Casual  Eye Contact::  Fair  Speech:  Pressured at times  Volume:  Increased at times  Mood:  Anxious and Euphoric  Affect:  Congruent  Thought Process:  Disorganized, Irrelevant and Tangential  Orientation:  Full (Time, Place, and Person)  Thought Content:  Hallucinations: talking to someone in her room  Suicidal Thoughts:  No  Homicidal Thoughts:  No  Memory:  Immediate;   Fair Recent;   Fair Remote;   Fair  Judgement:  Poor  Insight:  Lacking  Psychomotor Activity:  Increased  Concentration:  Poor  Recall:  Fair  Akathisia:  No  Handed:  Right  AIMS (if indicated):     Assets:  Leisure Time Physical Health Resilience Social Support  Sleep:      Treatment Plan Summary: Daily contact with patient to assess and evaluate symptoms and progress in treatment Medication management.    Patient remains on the [facility] wait list. Abilify increased to 10 mg daily and ativan 1 mg TID scheduled versus PRN  Nanine Means, PMH-NP 12/18/2012 10:13 AM  Reviewed the information documented and agree with the treatment plan.  Shelbylynn Walczyk,JANARDHAHA R. 12/18/2012 11:48 AM

## 2012-12-19 ENCOUNTER — Encounter (HOSPITAL_COMMUNITY): Payer: Self-pay | Admitting: Registered Nurse

## 2012-12-19 LAB — COMPREHENSIVE METABOLIC PANEL
ALT: 59 U/L — ABNORMAL HIGH (ref 0–35)
AST: 29 U/L (ref 0–37)
Albumin: 3.6 g/dL (ref 3.5–5.2)
Alkaline Phosphatase: 77 U/L (ref 39–117)
CO2: 28 mEq/L (ref 19–32)
Calcium: 9.6 mg/dL (ref 8.4–10.5)
Chloride: 99 mEq/L (ref 96–112)
GFR calc non Af Amer: >90 mL/min (ref >90)
Glucose, Bld: 125 mg/dL — ABNORMAL HIGH (ref 70–99)
Potassium: 3.9 mEq/L (ref 3.5–5.1)
Sodium: 134 mEq/L — ABNORMAL LOW (ref 135–145)
Total Protein: 7.6 g/dL (ref 6.0–8.3)

## 2012-12-19 MED ORDER — NICOTINE 21 MG/24HR TD PT24
21.0000 mg | MEDICATED_PATCH | Freq: Every day | TRANSDERMAL | Status: DC
  Administered 2012-12-19 – 2012-12-22 (×3): 21 mg via TRANSDERMAL
  Filled 2012-12-19 (×4): qty 1

## 2012-12-19 MED ORDER — ONDANSETRON 4 MG PO TBDP
4.0000 mg | ORAL_TABLET | Freq: Once | ORAL | Status: AC
  Administered 2012-12-19: 4 mg via ORAL
  Filled 2012-12-19: qty 1

## 2012-12-19 MED ORDER — TRAZODONE HCL 50 MG PO TABS
50.0000 mg | ORAL_TABLET | Freq: Every day | ORAL | Status: DC
  Administered 2012-12-19 – 2012-12-21 (×3): 50 mg via ORAL
  Filled 2012-12-19 (×2): qty 1

## 2012-12-19 NOTE — ED Notes (Signed)
Dr. Jonnalagadda into see 

## 2012-12-19 NOTE — ED Notes (Signed)
Up to the desk on the phone 

## 2012-12-19 NOTE — ED Notes (Signed)
Up in the [place] c/o nausea.  NAD,no vomiting noted, pt back to her room

## 2012-12-19 NOTE — ED Notes (Signed)
Up in  [facility] nausea resolved.

## 2012-12-19 NOTE — ED Notes (Signed)
Pt actively vomiting-pt reports now that is "lactose intolerant"--Pt has been eatting cheese x2 days that this writer is aware and using creamer.

## 2012-12-19 NOTE — ED Notes (Signed)
Up to the desk, nausea resolved, nad

## 2012-12-19 NOTE — BH Assessment (Signed)
Pt has been declined at [facility]. Pt continues to be on [facility] wait list. Contacted the following facilities for placement:  [facility]: At capacity [facility] At capacity [facility]:  At capacity [facility]: At capacity [facility]: At capacity [facility]: At capacity [facility]: At capacity [facility]: At capacity  [facility]: Pt is declined. [facility]: Pt is declined. [facility]: Pt is declined.   Harlin Rain Ria Comment, [facility] Triage Specialist

## 2012-12-19 NOTE — ED Notes (Signed)
Pt reports itching from the soap she has been using-up to the shower to change scrubs and shower w/ plan water.

## 2012-12-19 NOTE — Consult Note (Signed)
[facility] Face-to-Face Psychiatry Consult   Reason for Consult:  Mania Referring Physician:  ER MD Betty Parrish is an 22 y.o. female.  Assessment: AXIS I:  ADHD, hyperactive type, Anxiety Disorder NOS and Schizoaffective Disorder AXIS II:  Deferred AXIS III:   Past Medical History  Diagnosis Date  . Anxiety   . Heart murmur   . Sickle cell trait   . Schizo-affective psychosis   . ADHD (attention deficit hyperactivity disorder)    AXIS IV:  economic problems, housing problems, other psychosocial or environmental problems, problems related to social environment and problems with primary support group AXIS V:  41-50 serious symptoms  Plan:  Recommend psychiatric Inpatient admission when medically cleared.  Subjective:   Betty Parrish is a 22 y.o. female patient admitted with mania, on the wait list to [facility].  Patient resting in bed.  Patient denies suicidal ideation, homicidal ideation, and psychosis at this time.  Patient states that she is eating well.  Spoke to patient nurse who reports that patient behavior has improved yet still labile.  States that patient wasn't able to go to sleep last night but order for Trazodone received and given to patient.  Will continue to monitor for safety and stabilization until inpatient treatment bed found.    HPI Elements:   Location:  generalized. Quality:  acute. Severity:  severe. Timing:  constant. Duration:  past few weeks. Context:  stopped taking her oral anti-psychotic medications.  Past Psychiatric History: Past Medical History  Diagnosis Date  . Anxiety   . Heart murmur   . Sickle cell trait   . Schizo-affective psychosis   . ADHD (attention deficit hyperactivity disorder)     reports that she has been smoking Cigarettes.  She has been smoking about 1.00 pack per day. She does not have any smokeless tobacco history on file. She reports that she drinks alcohol. She reports that she uses illicit drugs (Marijuana). Family History  Problem  Relation Age of Onset  . Sickle cell anemia Mother            Allergies:   Allergies  Allergen Reactions  . Zyprexa [Olanzapine] Other (See Comments)    Reaction is unknown    ACT Assessment Complete:  Yes:    Educational Status    Risk to Self: Risk to self Is patient at risk for suicide?: No, but patient needs Medical Clearance Substance abuse history and/or treatment for substance abuse?: No  Risk to Others:    Abuse:    Prior Inpatient Therapy:    Prior Outpatient Therapy:    Additional Information:     Objective: Blood pressure 102/65, pulse 73, temperature 97.7 F (36.5 C), temperature source Oral, resp. rate 18, SpO2 100.00%.There is no height or weight on file to calculate BMI.No results found for this or any previous visit (from the past 72 hour(s)). Labs are reviewed and are pertinent for no medical issues.  Current Facility-Administered Medications  Medication Dose Route Frequency Provider Last Rate Last Dose  . acetaminophen (TYLENOL) tablet 650 mg  650 mg Oral Q4H PRN Marissa Sciacca, PA-C   650 mg at 12/18/12 1140  . alum & mag hydroxide-simeth (MAALOX/MYLANTA) 200-200-20 MG/5ML suspension 30 mL  30 mL Oral TID PRN Richardean Canal, MD      . ARIPiprazole (ABILIFY) tablet 10 mg  10 mg Oral Daily Nanine Means, NP      . guaiFENesin (MUCINEX) 12 hr tablet 600 mg  600 mg Oral BID PRN Richardean Canal,  MD   600 mg at 12/18/12 2138  . LORazepam (ATIVAN) tablet 1 mg  1 mg Oral Q8H Nanine Means, NP   1 mg at 12/19/12 0643  . nicotine (NICODERM CQ - dosed in mg/24 hr) patch 7 mg  7 mg Transdermal Daily Nanine Means, NP   7 mg at 12/18/12 1503  . ondansetron (ZOFRAN) tablet 4 mg  4 mg Oral Q8H PRN Marissa Sciacca, PA-C   4 mg at 12/15/12 2225  . traZODone (DESYREL) tablet 50 mg  50 mg Oral QHS PRN Richardean Canal, MD       Current Outpatient Prescriptions  Medication Sig Dispense Refill  . ARIPiprazole (ABILIFY MAINTENA) 300 MG SUSR Inject 300 mg into the muscle every 30 (thirty)  days.      . ARIPiprazole (ABILIFY) 5 MG tablet Take 5 mg by mouth daily.      Marland Kitchen tetrahydrozoline 0.05 % ophthalmic solution Place 2 drops into both eyes daily as needed (for dry eyes).        Psychiatric Specialty Exam:     Blood pressure 102/65, pulse 73, temperature 97.7 F (36.5 C), temperature source Oral, resp. rate 18, SpO2 100.00%.There is no height or weight on file to calculate BMI.  General Appearance: Casual  Eye Contact::  Fair  Speech:  Pressured at times  Volume:  Increased at times  Mood:  Anxious and Euphoric  Affect:  Congruent and Labile  Thought Process:  Disorganized, Irrelevant and Tangential  Orientation:  Full (Time, Place, and Person)  Thought Content:  Hallucinations: talking to someone in her room  Suicidal Thoughts:  No  Homicidal Thoughts:  No  Memory:  Immediate;   Fair Recent;   Fair Remote;   Fair  Judgement:  Poor  Insight:  Lacking  Psychomotor Activity:  Increased  Concentration:  Poor  Recall:  Fair  Akathisia:  No  Handed:  Right  AIMS (if indicated):     Assets:  Leisure Time Physical Health Resilience Social Support  Sleep:      Face to face interview and consult with Dr. Elsie Saas  Treatment Plan Summary: Daily contact with patient to assess and evaluate symptoms and progress in treatment Medication management.    Continue with current treatment plan. Patient remains on the [facility] wait list.   Assunta Found, FNP-BC 12/19/2012 8:55 AM  Reviewed the information documented and agree with the treatment plan.  Nhan Qualley,JANARDHAHA R. 12/19/2012 10:47 AM

## 2012-12-19 NOTE — ED Notes (Signed)
Up to the bathroom 

## 2012-12-19 NOTE — ED Notes (Signed)
Sitting quielty on the bed talking

## 2012-12-19 NOTE — ED Notes (Signed)
Betty Parrish, still tearful, upset that she can not see her child, pt encourage to verbalize fears/frustrations.  Pt tearful, but was able to remain calm.

## 2012-12-19 NOTE — ED Notes (Signed)
Patient presents with euphoric mood affect congruent. Denies any current pain; denies suicidality, homicidality and denies any auditory or visual hallucinations.
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
## 2012-12-19 NOTE — ED Notes (Signed)
Eating breakfast 

## 2012-12-19 NOTE — ED Notes (Addendum)
on the phone 

## 2012-12-19 NOTE — Progress Notes (Signed)
Per Elonda Husky, pt still on [facility] wait list.  Mariann Laster, [phone]     ED CSW

## 2012-12-19 NOTE — ED Notes (Signed)
Up tot he bathroom to shower and change scrubs 

## 2012-12-20 DIAGNOSIS — F411 Generalized anxiety disorder: Secondary | ICD-10-CM

## 2012-12-20 DIAGNOSIS — F909 Attention-deficit hyperactivity disorder, unspecified type: Secondary | ICD-10-CM

## 2012-12-20 MED ORDER — LORAZEPAM 1 MG PO TABS
1.0000 mg | ORAL_TABLET | Freq: Once | ORAL | Status: AC
  Administered 2012-12-20: 1 mg via ORAL

## 2012-12-20 MED ORDER — ARIPIPRAZOLE ER 400 MG IM SUSR
400.0000 mg | Freq: Once | INTRAMUSCULAR | Status: AC
  Administered 2012-12-20: 400 mg via INTRAMUSCULAR
  Filled 2012-12-20: qty 400

## 2012-12-20 MED ORDER — ARIPIPRAZOLE 15 MG PO TABS
15.0000 mg | ORAL_TABLET | Freq: Every day | ORAL | Status: DC
  Administered 2012-12-21 – 2012-12-22 (×2): 15 mg via ORAL
  Filled 2012-12-20 (×2): qty 1

## 2012-12-20 MED ORDER — DIVALPROEX SODIUM 250 MG PO DR TAB
250.0000 mg | DELAYED_RELEASE_TABLET | Freq: Two times a day (BID) | ORAL | Status: DC
  Administered 2012-12-20 – 2012-12-22 (×5): 250 mg via ORAL
  Filled 2012-12-20 (×5): qty 1

## 2012-12-20 NOTE — Progress Notes (Signed)
Pt act team provided collateral information that patient was taking respirdal consta injection and changed to abilify injection. Per discussion with NP, trying to obtain abilify injection to be administerred. Pt pending [facility].   Catha Gosselin, LCSW [phone]  ED CSW .12/20/2012 1347pm

## 2012-12-20 NOTE — ED Notes (Signed)
Patient told rn latricia she doesn't want to be waken up for 5 in the morning vitals she would rather wait for the doctor to come in and get her vitals then

## 2012-12-20 NOTE — Progress Notes (Signed)
CSW spoke with Okey Regal at 830am who confirmed pt remains on [facility] waitlist.   .Catha Gosselin, LCSW [phone]  ED CSW .12/20/2012 908am

## 2012-12-20 NOTE — Progress Notes (Signed)
Patient ID: Betty Parrish, female   DOB: December 12, 1990, 22 y.o.   MRN: [id number] ADDENDUM:  This patient received ABILIFY injection 400 mg IM TODAY. Dahlia Byes   PMHNP-BC

## 2012-12-20 NOTE — Consult Note (Signed)
Patient Identification:  Betty Parrish Date of Evaluation:  12/20/2012   History of Present Illness:  Patient was seen by Dr Lolly Mustache and this writer this am.  Patient was planning for a discharge and became angry when she was told we could not discharge her.  Patient is worried about losing her daughter but at the same time her insight is good.  She stated she need to be stabilized on her antipsychotic medications to avoid another Psychotic break down.  Patient became emotional and started crying because she could not be discharged.  We will give her Abilify injection today and start her on Depakote.  Next provider will reevaluate in am.  She denies SI/HI/AVH.  Past Psychiatric History: ADHD, hyperactive type, Anxiety Disorder NOS and Schizoaffective Disorder    Past Medical History:     Past Medical History  Diagnosis Date  . Anxiety   . Heart murmur   . Sickle cell trait   . Schizo-affective psychosis   . ADHD (attention deficit hyperactivity disorder)       History reviewed. No pertinent past surgical history.  Allergies:  Allergies  Allergen Reactions  . Zyprexa [Olanzapine] Other (See Comments)    Reaction is unknown    Current Medications:  Prior to Admission medications   Medication Sig Start Date End Date Taking? Authorizing Provider  ARIPiprazole (ABILIFY MAINTENA) 300 MG SUSR Inject 300 mg into the muscle every 30 (thirty) days.   Yes Historical Provider, MD  ARIPiprazole (ABILIFY) 5 MG tablet Take 5 mg by mouth daily.   Yes Historical Provider, MD  tetrahydrozoline 0.05 % ophthalmic solution Place 2 drops into both eyes daily as needed (for dry eyes).   Yes Historical Provider, MD    Social History:    reports that she has been smoking Cigarettes.  She has been smoking about 1.00 pack per day. She does not have any smokeless tobacco history on file. She reports that she drinks alcohol. She reports that she uses illicit drugs (Marijuana).   Family History:    Family  History  Problem Relation Age of Onset  . Sickle cell anemia Mother     Mental Status Examination/Evaluation:Psychiatric Specialty Exam: Physical Exam  ROS  Blood pressure 86/51, pulse 64, temperature 97.8 F (36.6 C), temperature source Oral, resp. rate 16, SpO2 98.00%.There is no height or weight on file to calculate BMI.  General Appearance: Casual and Disheveled  Eye Contact::  Good  Speech:  Clear and Coherent and Pressured  Volume:  Normal  Mood:  Angry, Anxious, Depressed and Irritable  Affect:  Appropriate, Congruent, Depressed, Flat and Labile  Thought Process:  Coherent and Goal Directed  Orientation:  Full (Time, Place, and Person)  Thought Content:  NA  Suicidal Thoughts:  No  Homicidal Thoughts:  No  Memory:  Immediate;   Good Recent;   Good Remote;   Good  Judgement:  Poor  Insight:  Good  Psychomotor Activity:  Normal  Concentration:  Fair  Recall:  Good  Akathisia:  NA  Handed:  Right  AIMS (if indicated):     Assets:  Desire for Improvement  Sleep:          DIAGNOSIS:   AXIS I   ADHD, hyperactive type, Anxiety Disorder NOS and Schizoaffective Disorder   AXIS II  Deffered  AXIS III See medical notes.  AXIS IV other psychosocial or environmental problems and problems related to social environment  AXIS V 41-50 serious symptoms     Assessment/Plan: Face  to face evaluation with Dr Lolly Mustache We will administer Abilify Maintaina  injection 300 MG IM q 4 weeks We will start Depakote as a mood stabilizer Next provider will reassess in am while we are still keeping her in [facility] wait list Dahlia Byes  PMHNP-BC  I have personally seen the patient and agreed with the findings and involved in the treatment plan. Kathryne Sharper, MD

## 2012-12-20 NOTE — [facility] Counselor (Signed)
Pt is currently being given Abilify IM, initial dose. Denice Bors, [facility] 12/20/2012 3:49 PM

## 2012-12-20 NOTE — ED Notes (Signed)
Patient up to phone.

## 2012-12-21 DIAGNOSIS — R454 Irritability and anger: Secondary | ICD-10-CM

## 2012-12-21 NOTE — Progress Notes (Signed)
CSW spoke with Betty Parrish at [facility]. Pt is still on waitlist.  Per Betty Parrish she left a message for pts ACTT requesting some additional information.  Once that information is received and reviewed they may be able to make a decision on pts placement.  Betty Parrish, Betty Parrish  [phone]  .12/21/2012  9:45 pm

## 2012-12-21 NOTE — Progress Notes (Signed)
[facility]: Spoke with Thayer Ohm @1300  pt is still on the waiting list.  Deatra James, MHT

## 2012-12-21 NOTE — Consult Note (Signed)
  Psychiatric Specialty Exam: Physical Exam  ROS  Blood pressure 98/60, pulse 69, temperature 98.5 F (36.9 C), temperature source Oral, resp. rate 18, SpO2 100.00%.There is no height or weight on file to calculate BMI.  General Appearance: Casual  Eye Contact::  Good  Speech:  Clear and Coherent and Normal Rate  Volume:  Normal  Mood:  Irritable  Affect:  Appropriate  Thought Process:  Coherent and Logical  Orientation:  Full (Time, Place, and Person)  Thought Content:  Negative  Suicidal Thoughts:  No  Homicidal Thoughts:  No  Memory:  Immediate;   Good Recent;   Good Remote;   Good  Judgement:  Intact  Insight:  Lacking  Psychomotor Activity:  Normal  Concentration:  Good  Recall:  Negative  Akathisia:  Negative  Handed:  Right  AIMS (if indicated):     Assets:  Communication Skills Housing Leisure Time Social Support  Sleep:   good   Ms Gitto continues looking less manic.  She is irritable, impatient to go home.  She is a bit labile but not remarkedly so .  When her ACT team says they can deal with her outpatient, she can be discharged.  In the meantime keep her on the [facility] waiting list.

## 2012-12-21 NOTE — ED Notes (Signed)
Pt. Has Ativan scheduled at 1400, pt. Calm/asleep, will give pt. 1400 when she is awake.

## 2012-12-22 DIAGNOSIS — F309 Manic episode, unspecified: Secondary | ICD-10-CM

## 2012-12-22 DIAGNOSIS — R443 Hallucinations, unspecified: Secondary | ICD-10-CM

## 2012-12-22 NOTE — ED Notes (Signed)
IVC paperwork received from [name].

## 2012-12-22 NOTE — ED Notes (Signed)
IVC paperwork faxed to magistrate by Eunice Blase, ED Secretary.

## 2012-12-22 NOTE — ED Notes (Signed)
Received call from [place], in dispositions, stating that she spoke with [facility] and now they are requesting a MAR and vitals for 7 days. They also state that they did not receive the IVC paper.

## 2012-12-22 NOTE — ED Notes (Signed)
Received a call from [place], in dispositions, stating that pt had been accepted to [facility] pending [facility] receipt of 5 days of vitals, 5 days of MAR's, and IVC renewal.

## 2012-12-22 NOTE — ED Notes (Signed)
Beginning IVC renewal process. Paperwork given to MD for completion.

## 2012-12-22 NOTE — ED Notes (Signed)
Spoke with Marcelino Duster, in dispositions, about pt acceptance to [facility].  She states that [facility] informed her that they would contact her once they are ready to receive pt.

## 2012-12-22 NOTE — Consult Note (Signed)
Subjective:  Patient is states that she is feeling better and stating that she does not want to go to [facility].  Patient states "They told me if I came back there again they would keep me there for 6 months or the rest of my life.  I think I'm ready to go home.  I don't need to go there."  Psychiatric Specialty Exam: Physical Exam  ROS  Blood pressure 96/61, pulse 71, temperature 98.4 F (36.9 C), temperature source Oral, resp. rate 18, SpO2 99.00%.There is no height or weight on file to calculate BMI.  General Appearance: Casual  Eye Contact::  Good  Speech:  Clear and Coherent and Normal Rate  Volume:  Normal  Mood:  Irritable  Affect:  Appropriate  Thought Process:  Coherent and Logical  Orientation:  Full (Time, Place, and Person)  Thought Content:  Negative  Suicidal Thoughts:  No  Homicidal Thoughts:  No  Memory:  Immediate;   Good Recent;   Good Remote;   Good  Judgement:  Intact  Insight:  Lacking  Psychomotor Activity:  Normal  Concentration:  Good  Recall:  Negative  Akathisia:  Negative  Handed:  Right  AIMS (if indicated):     Assets:  Communication Skills Housing Leisure Time Social Support  Sleep:   good   Face to face consult and consult with Dr. Lolly Mustache  Disposition:  Will continue with current plan for [facility] inpatient treatment. Patient has been accepted and should transfer to [facility] today.  Shuvon B. Rankin FNP-BC Family Nurse Practitioner, Board Certified  I have personally seen the patient and agreed with the findings and involved in the treatment plan. Kathryne Sharper, MD

## 2012-12-22 NOTE — ED Notes (Signed)
IVC paperwork, 5 days of vitals, and 5 days of the [facility] faxed to [facility] at [phone].

## 2013-03-06 ENCOUNTER — Encounter (HOSPITAL_COMMUNITY): Payer: Self-pay | Admitting: Emergency Medicine

## 2013-03-06 ENCOUNTER — Emergency Department (HOSPITAL_COMMUNITY)
Admission: EM | Admit: 2013-03-06 | Discharge: 2013-03-06 | Disposition: A | Discharge status: Home / Self Care | Payer: Medicaid Other | Attending: Emergency Medicine | Admitting: Emergency Medicine

## 2013-03-06 DIAGNOSIS — A499 Bacterial infection, unspecified: Secondary | ICD-10-CM | POA: Insufficient documentation

## 2013-03-06 DIAGNOSIS — B9689 Other specified bacterial agents as the cause of diseases classified elsewhere: Secondary | ICD-10-CM

## 2013-03-06 DIAGNOSIS — F17200 Nicotine dependence, unspecified, uncomplicated: Secondary | ICD-10-CM | POA: Insufficient documentation

## 2013-03-06 DIAGNOSIS — N390 Urinary tract infection, site not specified: Secondary | ICD-10-CM

## 2013-03-06 DIAGNOSIS — N760 Acute vaginitis: Secondary | ICD-10-CM | POA: Insufficient documentation

## 2013-03-06 DIAGNOSIS — F259 Schizoaffective disorder, unspecified: Secondary | ICD-10-CM | POA: Insufficient documentation

## 2013-03-06 DIAGNOSIS — N898 Other specified noninflammatory disorders of vagina: Secondary | ICD-10-CM

## 2013-03-06 DIAGNOSIS — R011 Cardiac murmur, unspecified: Secondary | ICD-10-CM | POA: Insufficient documentation

## 2013-03-06 DIAGNOSIS — Z862 Personal history of diseases of the blood and blood-forming organs and certain disorders involving the immune mechanism: Secondary | ICD-10-CM | POA: Insufficient documentation

## 2013-03-06 DIAGNOSIS — Z79899 Other long term (current) drug therapy: Secondary | ICD-10-CM | POA: Insufficient documentation

## 2013-03-06 DIAGNOSIS — Z3202 Encounter for pregnancy test, result negative: Secondary | ICD-10-CM | POA: Insufficient documentation

## 2013-03-06 LAB — URINALYSIS, ROUTINE W REFLEX MICROSCOPIC
Ketones, ur: NEGATIVE mg/dL
Protein, ur: NEGATIVE mg/dL
Specific Gravity, Urine: 1.019 (ref 1.005–1.030)
Urobilinogen, UA: 1 mg/dL (ref 0.0–1.0)

## 2013-03-06 LAB — WET PREP, GENITAL
Trich, Wet Prep: NONE SEEN
Yeast Wet Prep HPF POC: NONE SEEN

## 2013-03-06 LAB — URINE MICROSCOPIC-ADD ON

## 2013-03-06 MED ORDER — VALACYCLOVIR HCL 1 G PO TABS: 1000.0000 mg | ORAL_TABLET | Freq: Two times a day (BID) | ORAL | Status: DC

## 2013-03-06 MED ORDER — FLUCONAZOLE 150 MG PO TABS: 150.0000 mg | ORAL_TABLET | Freq: Once | ORAL | Status: DC

## 2013-03-06 MED ORDER — LIDOCAINE HCL 2 % EX GEL: 1.0000 "application " | CUTANEOUS | Status: DC | PRN

## 2013-03-06 MED ORDER — CEPHALEXIN 500 MG PO CAPS: 500.0000 mg | ORAL_CAPSULE | Freq: Four times a day (QID) | ORAL | Status: DC

## 2013-03-06 MED ORDER — METRONIDAZOLE 500 MG PO TABS: 500.0000 mg | ORAL_TABLET | Freq: Two times a day (BID) | ORAL | Status: DC

## 2013-03-06 NOTE — ED Notes (Signed)
Reports having two bumps to vaginal area x 2 weeks that are itching and having vaginal discharge.

## 2013-03-06 NOTE — ED Notes (Signed)
Abigail PA at bedside updating pt on results. This RN present as witness

## 2013-03-06 NOTE — ED Provider Notes (Signed)
CSN: [id number]     Arrival date & time 03/06/13  1407 History   First MD Initiated Contact with Patient 03/06/13 1632     Chief Complaint  Patient presents with  . Vaginal Itching   (Consider location/radiation/quality/duration/timing/severity/associated sxs/prior Treatment) HPI This is a 22 year old female who presents the emergency department with chief complaint of vaginal bump.  The patient states that for the past 2 weeks she has had bumps on the inner labia and below the vaginal introitus that are itchy and painful.  She states that they are blisterlike.  She has never had these before.  Her sexual partner is in the room with her and he denies history of similar symptoms or any current symptoms.  Patient denies fever, myalgias, arthralgias.  Patient denies any foul odor of discharge, dyspareunia, abnormal vaginal bleeding.  She denies any urinary symptoms.  Past Medical History  Diagnosis Date  . Anxiety   . Heart murmur   . Sickle cell trait   . Schizo-affective psychosis   . ADHD (attention deficit hyperactivity disorder)    History reviewed. No pertinent past surgical history. Family History  Problem Relation Age of Onset  . Sickle cell anemia Mother    History  Substance Use Topics  . Smoking status: Current Every Day Smoker -- 1.00 packs/day    Types: Cigarettes  . Smokeless tobacco: Not on file  . Alcohol Use: Yes     Comment: daily   OB History   Grav Para Term Preterm Abortions TAB SAB Ect Mult Living                 Review of Systems Ten systems reviewed and are negative for acute change, except as noted in the HPI.    Allergies  Zyprexa  Home Medications   Current Outpatient Rx  Name  Route  Sig  Dispense  Refill  . ARIPiprazole (ABILIFY MAINTENA) 300 MG SUSR   Intramuscular   Inject 300 mg into the muscle every 30 (thirty) days.         . ARIPiprazole (ABILIFY) 5 MG tablet   Oral   Take 5 mg by mouth daily.         Marland Kitchen tetrahydrozoline 0.05  % ophthalmic solution   Both Eyes   Place 2 drops into both eyes daily as needed (for dry eyes).          BP 109/61  Pulse 82  Temp(Src) 98.2 F (36.8 C) (Oral)  Resp 16  Wt 169 lb (76.658 kg)  SpO2 100% Physical Exam  Constitutional: She is oriented to person, place, and time. She appears well-developed and well-nourished. No distress.  HENT:  Head: Normocephalic and atraumatic.  Eyes: Conjunctivae are normal. No scleral icterus.  Neck: Normal range of motion.  Cardiovascular: Normal rate, regular rhythm and normal heart sounds.  Exam reveals no gallop and no friction rub.   No murmur heard. Pulmonary/Chest: Effort normal and breath sounds normal. No respiratory distress.  Abdominal: Soft. Bowel sounds are normal. She exhibits no distension and no mass. There is no tenderness. There is no guarding.  Genitourinary:     Pelvic exam: VULVA: vulvar lesion as noted, VAGINA: normal appearing vagina with normal color and discharge, no lesions, CERVIX: normal appearing cervix without discharge or lesions,no CMT ADNEXA: normal adnexa in size, nontender and no masses, exam chaperoned    Neurological: She is alert and oriented to person, place, and time.  Skin: Skin is warm and dry. She is not  diaphoretic.    ED Course  Procedures (including critical care time) Labs Review Labs Reviewed  GC/CHLAMYDIA PROBE AMP  WET PREP, GENITAL  URINALYSIS, ROUTINE W REFLEX MICROSCOPIC  POCT PREGNANCY, URINE   Imaging Review No results found.  EKG Interpretation   None       MDM   1. UTI (lower urinary tract infection)   2. BV (bacterial vaginosis)   3. Vaginal lesion    5:38 PM  BP 110/67  Pulse 68  Temp(Src) 98.2 F (36.8 C) (Oral)  Resp 16  Wt 169 lb (76.658 kg)  SpO2 100% Patient with Vesicular lesions consistent with herpes. Wet prep, UA, g/c chlamydia pending.   U-preg negative. Patient wet prep Pos for clue cells,  Appears to have UTI and pos for rare yeast in her  urine.  G/C chlamydia are pending. Patient will be treated with keflex, diflucan, valtrex and flagyl. G/c chlamydia pending.findings discussed with patient including need for protection during sexual intercourse with partner, also need for further testing such as HIV and syphillis. I have given patient referral to [facility]. Patient expresses understanding and agrees with [facility].   Arthor Captain, PA-C 03/07/13 [phone]

## 2013-03-07 LAB — GC/CHLAMYDIA PROBE AMP
CT Probe RNA: POSITIVE — AB
GC Probe RNA: NEGATIVE

## 2013-03-08 ENCOUNTER — Telehealth (HOSPITAL_COMMUNITY): Payer: Self-pay | Admitting: Emergency Medicine

## 2013-03-08 LAB — URINE CULTURE: Colony Count: 25000

## 2013-03-08 NOTE — ED Notes (Signed)
Patient has +Chlamydia. 

## 2013-03-08 NOTE — ED Notes (Signed)
+  Chlamydia. Chart sent to EDP office for review. [facility]HS attached. 

## 2013-03-09 NOTE — ED Provider Notes (Signed)
Medical screening examination/treatment/procedure(s) were performed by non-physician practitioner and as supervising physician I was immediately available for consultation/collaboration.  EKG Interpretation   None         Laray Anger, DO 03/09/13 1536

## 2013-03-11 NOTE — ED Notes (Addendum)
Chart returned from EDP office ,with orders for Azithromycin 1 gram po once no refill needs to [facility] to [facility] [id number]

## 2013-08-17 IMAGING — US US TRANSVAGINAL NON-OB
1 series · 13 of 25 positions shown · non-contrast
Comparison: 07/29/2007

CLINICAL DATA: Left lower quadrant pain.  Query placement of IUD.

TRANSABDOMINAL AND TRANSVAGINAL ULTRASOUND OF PELVIS
TECHNIQUE: Both transabdominal and transvaginal ultrasound
examinations of the pelvis were performed. Transabdominal technique
was performed for global imaging of the pelvis including uterus,
ovaries, adnexal regions, and pelvic cul-de-sac.

[Series 1: us transvaginal non-ob · 0.24mm/px · 13 of 49 slices shown]
[im 1/49]
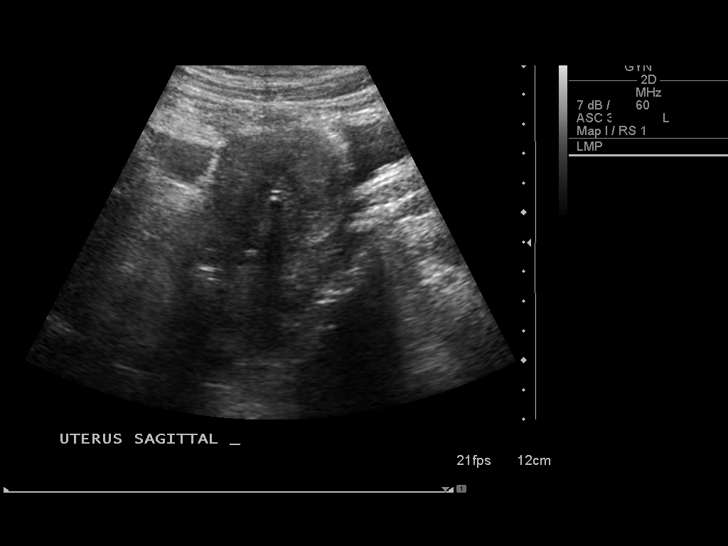
[im 5/49]
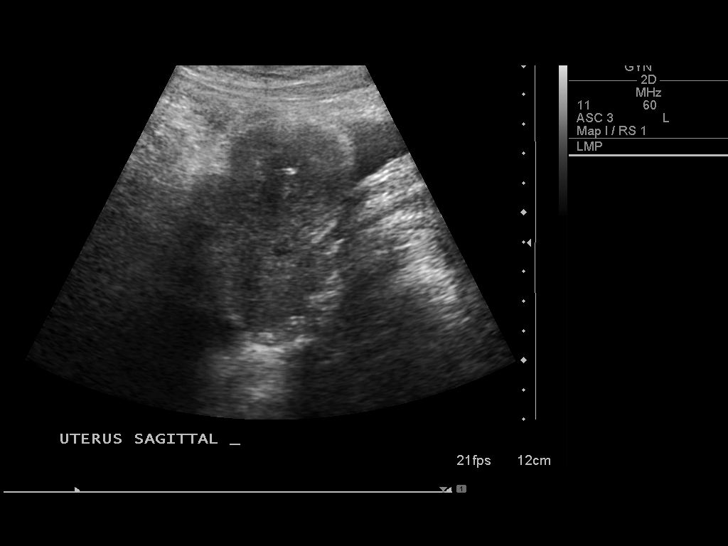
[im 9/49]
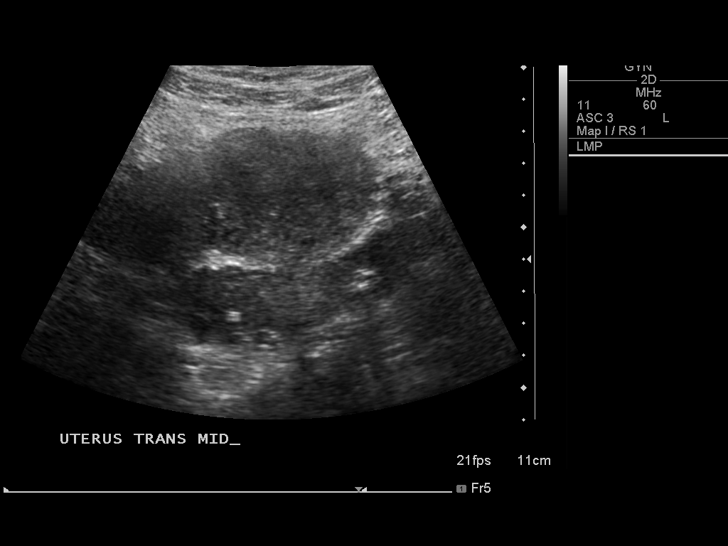
[im 13/49]
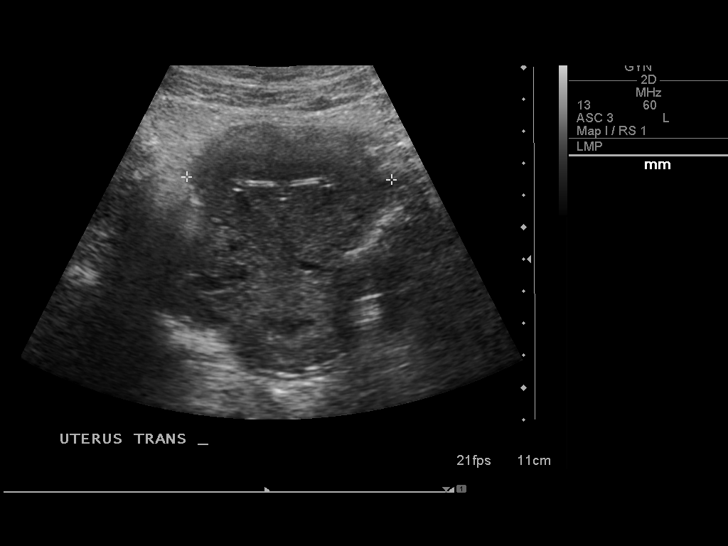
[im 17/49]
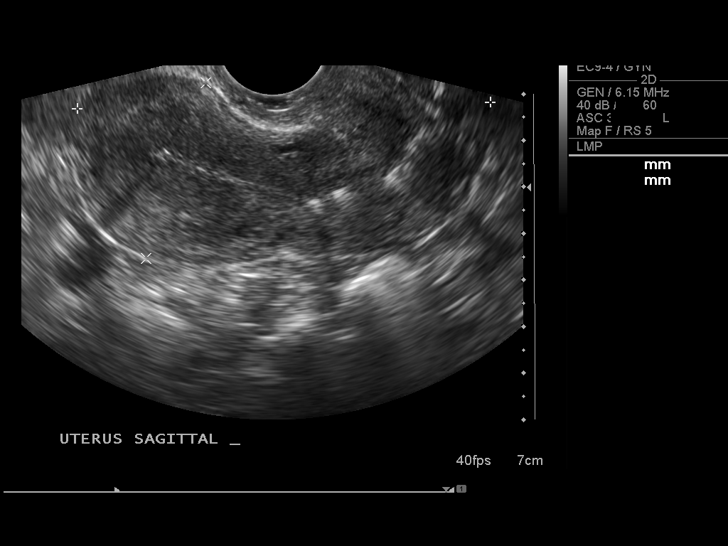
[im 21/49]
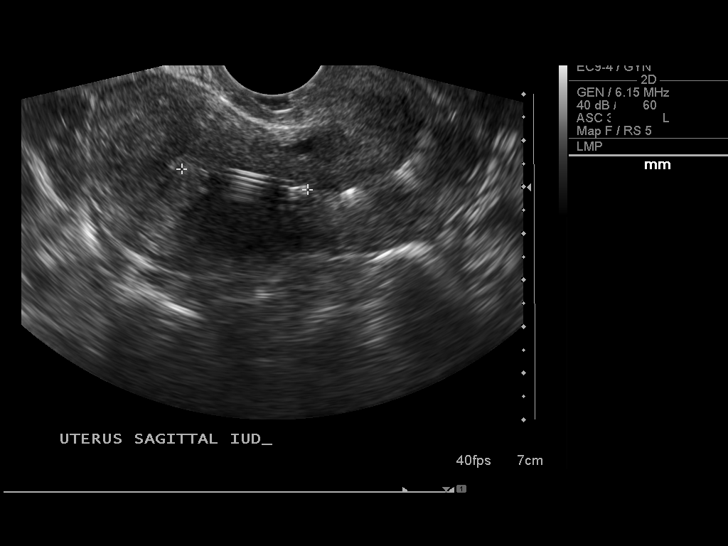
[im 25/49]
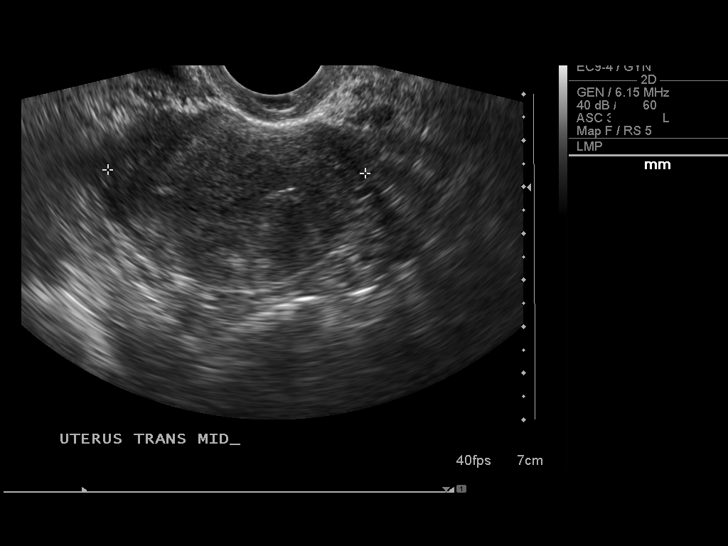
[im 29/49]
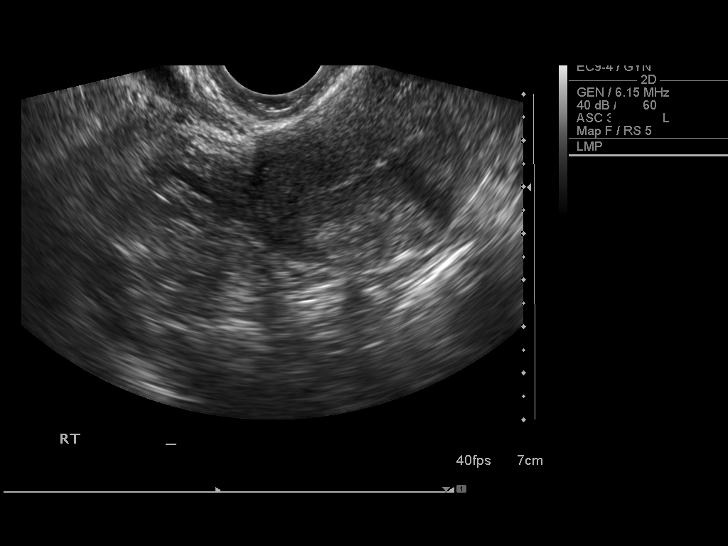
[im 33/49]
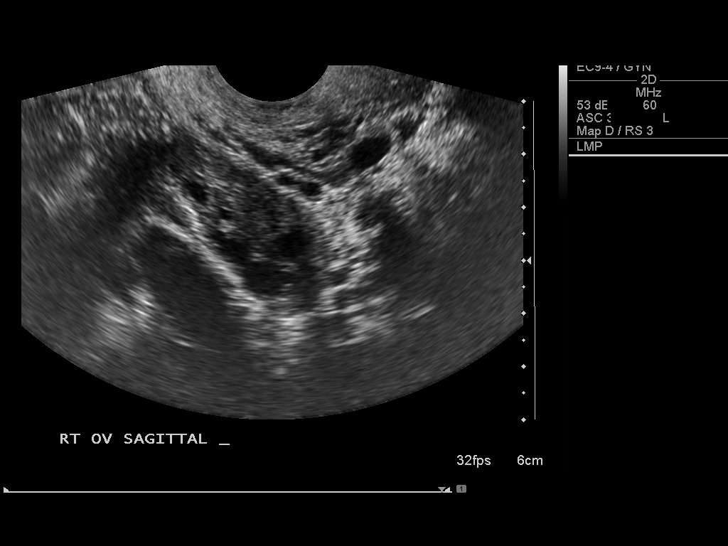
[im 37/49]
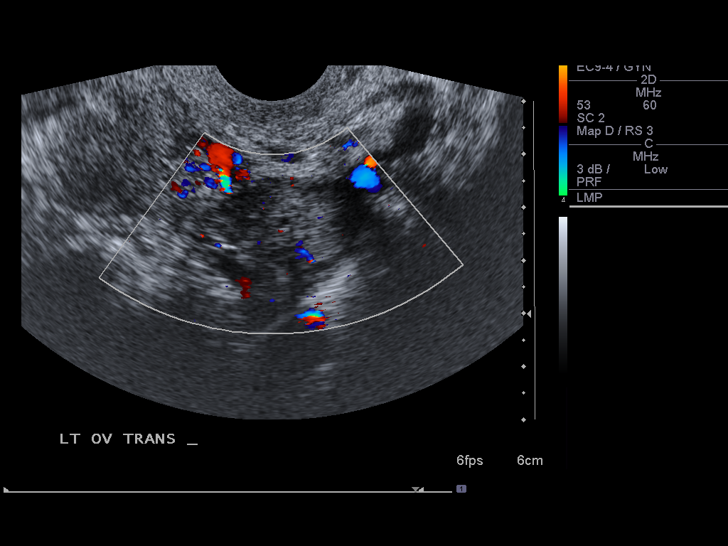
[im 41/49]
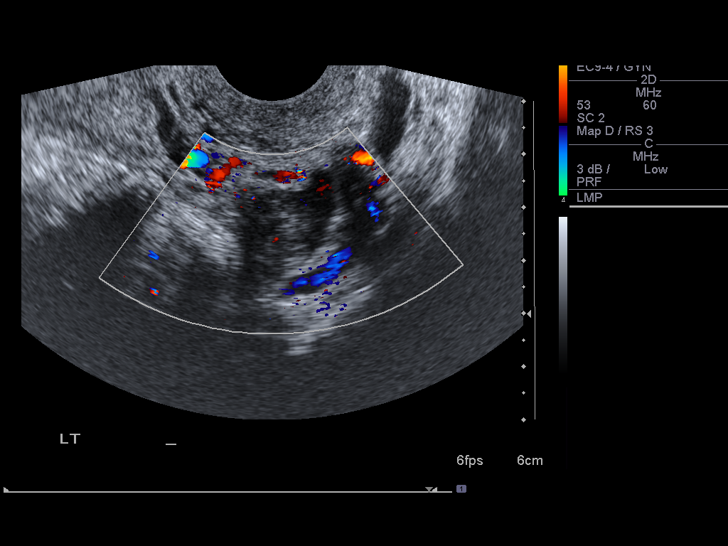
[im 45/49]
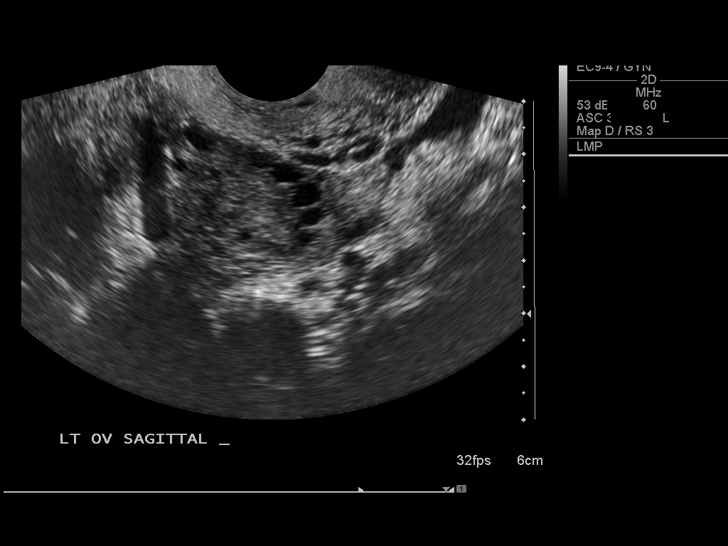
[im 49/49]
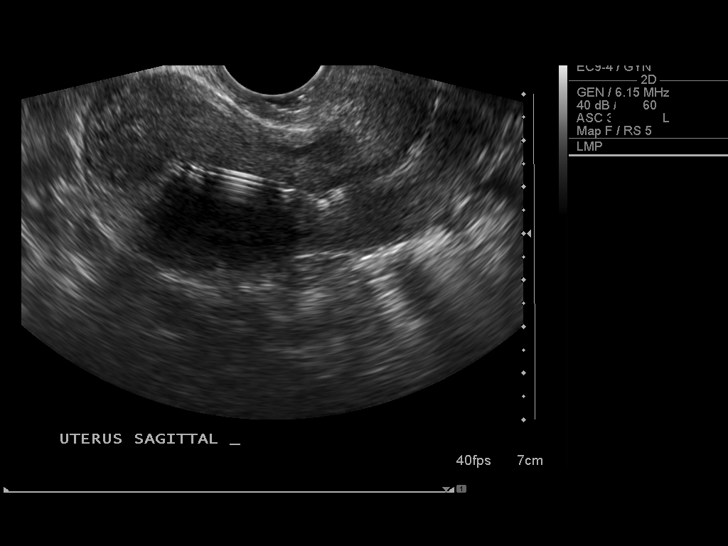

[13 of 25 positions shown; findings below may reference images not displayed]

It was necessary to proceed with endovaginal exam following the
transabdominal exam to visualize the the ovaries and endometrium.
FINDINGS: Uterus: The uterus is anteverted and measures 8.9 x 4 x 5.5 cm.  No
focal myometrial masses.

Endometrium: Endometrial device appears to be appropriately
positioned within endometrial cavity.  Endometrial stripe thickness
is normal, measuring about 2 mm.  No abnormal endometrial fluid
collections.

Right ovary:  Right ovary measures 4.2 x 2 x 2.2 cm and contains
normal follicular changes.  Color flow Doppler images demonstrate
flow in the right ovary.

Left ovary: The left ovary measures 3.8 x 3 x 3.3 cm.  Color flow
Doppler images demonstrate flow in the left ovary.  Small amount of
free fluid around the left adnexal region.

Other findings: Small amount of free fluid around the left adnexal
region.
IMPRESSION: Intrauterine device in place.  Otherwise normal appearance of the
uterus and ovaries.  Small amount of free fluid in the left adnexal
region..

## 2014-09-27 ENCOUNTER — Encounter (HOSPITAL_COMMUNITY): Payer: Self-pay | Admitting: Emergency Medicine

## 2014-09-27 ENCOUNTER — Emergency Department (HOSPITAL_COMMUNITY)
Admission: EM | Admit: 2014-09-27 | Discharge: 2014-09-27 | Discharge status: Against Medical Advice | Payer: Medicaid Other | Attending: Emergency Medicine | Admitting: Emergency Medicine

## 2014-09-27 DIAGNOSIS — Z79899 Other long term (current) drug therapy: Secondary | ICD-10-CM | POA: Insufficient documentation

## 2014-09-27 DIAGNOSIS — F419 Anxiety disorder, unspecified: Secondary | ICD-10-CM | POA: Diagnosis not present

## 2014-09-27 DIAGNOSIS — N390 Urinary tract infection, site not specified: Secondary | ICD-10-CM | POA: Diagnosis not present

## 2014-09-27 DIAGNOSIS — N898 Other specified noninflammatory disorders of vagina: Secondary | ICD-10-CM | POA: Diagnosis not present

## 2014-09-27 DIAGNOSIS — Z008 Encounter for other general examination: Secondary | ICD-10-CM | POA: Diagnosis present

## 2014-09-27 DIAGNOSIS — R011 Cardiac murmur, unspecified: Secondary | ICD-10-CM | POA: Diagnosis not present

## 2014-09-27 DIAGNOSIS — Z720 Tobacco use: Secondary | ICD-10-CM | POA: Insufficient documentation

## 2014-09-27 DIAGNOSIS — Z862 Personal history of diseases of the blood and blood-forming organs and certain disorders involving the immune mechanism: Secondary | ICD-10-CM | POA: Diagnosis not present

## 2014-09-27 DIAGNOSIS — F259 Schizoaffective disorder, unspecified: Secondary | ICD-10-CM | POA: Diagnosis not present

## 2014-09-27 LAB — ACETAMINOPHEN LEVEL: Acetaminophen (Tylenol), Serum: <10 ug/mL — ABNORMAL LOW (ref 10–30)

## 2014-09-27 LAB — RPR: RPR: NONREACTIVE

## 2014-09-27 LAB — CBC WITH DIFFERENTIAL/PLATELET
BASOS PCT: 0 % (ref 0–1)
Basophils Absolute: 0 10*3/uL (ref 0.0–0.1)
EOS ABS: 0.3 10*3/uL (ref 0.0–0.7)
Eosinophils Relative: 4 % (ref 0–5)
HEMATOCRIT: 41.1 % (ref 36.0–46.0)
Hemoglobin: 14.5 g/dL (ref 12.0–15.0)
Lymphocytes Relative: 24 % (ref 12–46)
Lymphs Abs: 1.8 10*3/uL (ref 0.7–4.0)
MCH: 31.9 pg (ref 26.0–34.0)
MCHC: 35.3 g/dL (ref 30.0–36.0)
MCV: 90.5 fL (ref 78.0–100.0)
MONO ABS: 0.9 10*3/uL (ref 0.1–1.0)
MONOS PCT: 13 % — AB (ref 3–12)
NEUTROS ABS: 4.2 10*3/uL (ref 1.7–7.7)
NEUTROS PCT: 59 % (ref 43–77)
Platelets: 243 10*3/uL (ref 150–400)
RBC: 4.54 MIL/uL (ref 3.87–5.11)
RDW: 12.6 % (ref 11.5–15.5)
WBC: 7.2 10*3/uL (ref 4.0–10.5)

## 2014-09-27 LAB — COMPREHENSIVE METABOLIC PANEL
ALK PHOS: 58 U/L (ref 38–126)
ALT: 21 U/L (ref 14–54)
ANION GAP: 9 (ref 5–15)
AST: 22 U/L (ref 15–41)
Albumin: 4.9 g/dL (ref 3.5–5.0)
BUN: 7 mg/dL (ref 6–20)
CO2: 25 mmol/L (ref 22–32)
Calcium: 10.2 mg/dL (ref 8.9–10.3)
Chloride: 104 mmol/L (ref 101–111)
Creatinine, Ser: 0.9 mg/dL (ref 0.44–1.00)
Glucose, Bld: 107 mg/dL — ABNORMAL HIGH (ref 65–99)
Potassium: 3.7 mmol/L (ref 3.5–5.1)
SODIUM: 138 mmol/L (ref 135–145)
Total Bilirubin: 1.1 mg/dL (ref 0.3–1.2)
Total Protein: 8.5 g/dL — ABNORMAL HIGH (ref 6.5–8.1)

## 2014-09-27 LAB — RAPID URINE DRUG SCREEN, HOSP PERFORMED
AMPHETAMINES: NOT DETECTED
BENZODIAZEPINES: NOT DETECTED
Barbiturates: NOT DETECTED
Cocaine: NOT DETECTED
Opiates: NOT DETECTED
Tetrahydrocannabinol: POSITIVE — AB

## 2014-09-27 LAB — HIV ANTIBODY (ROUTINE TESTING W REFLEX): HIV SCREEN 4TH GENERATION: NONREACTIVE

## 2014-09-27 LAB — WET PREP, GENITAL
Trich, Wet Prep: NONE SEEN
Yeast Wet Prep HPF POC: NONE SEEN

## 2014-09-27 LAB — SALICYLATE LEVEL

## 2014-09-27 LAB — URINALYSIS, ROUTINE W REFLEX MICROSCOPIC
Bilirubin Urine: NEGATIVE
Glucose, UA: NEGATIVE mg/dL
Ketones, ur: NEGATIVE mg/dL
NITRITE: POSITIVE — AB
Protein, ur: 100 mg/dL — AB
SPECIFIC GRAVITY, URINE: 1.022 (ref 1.005–1.030)
UROBILINOGEN UA: 0.2 mg/dL (ref 0.0–1.0)
pH: 5.5 (ref 5.0–8.0)

## 2014-09-27 LAB — URINE MICROSCOPIC-ADD ON

## 2014-09-27 LAB — ETHANOL

## 2014-09-27 MED ORDER — CEPHALEXIN 500 MG PO CAPS: 500.0000 mg | ORAL_CAPSULE | Freq: Four times a day (QID) | ORAL | Status: DC

## 2014-09-27 MED ORDER — CEPHALEXIN 500 MG PO CAPS
500.0000 mg | ORAL_CAPSULE | Freq: Two times a day (BID) | ORAL | Status: DC
  Administered 2014-09-27: 500 mg via ORAL
  Filled 2014-09-27: qty 1

## 2014-09-27 NOTE — Discharge Instructions (Signed)
Urinary Tract Infection Return for fever or back pain. Follow up with women's outpatient clinic. Call hospital for STD results. A urinary tract infection (UTI) can occur any place along the urinary tract. The tract includes the kidneys, ureters, bladder, and urethra. A type of germ called bacteria often causes a UTI. UTIs are often helped with antibiotic medicine.  HOME CARE   If given, take antibiotics as told by your doctor. Finish them even if you start to feel better.  Drink enough fluids to keep your pee (urine) clear or pale yellow.  Avoid tea, drinks with caffeine, and bubbly (carbonated) drinks.  Pee often. Avoid holding your pee in for a long time.  Pee before and after having sex (intercourse).  Wipe from front to back after you poop (bowel movement) if you are a woman. Use each tissue only once. GET HELP RIGHT AWAY IF:   You have back pain.  You have lower belly (abdominal) pain.  You have chills.  You feel sick to your stomach (nauseous).  You throw up (vomit).  Your burning or discomfort with peeing does not go away.  You have a fever.  Your symptoms are not better in 3 days. MAKE SURE YOU:   Understand these instructions.  Will watch your condition.  Will get help right away if you are not doing well or get worse. Document Released: 08/13/2007 Document Revised: 11/19/2011 Document Reviewed: 09/25/2011 [facility] Patient Information 2015 [employer] [place], [place]. This information is not intended to replace advice given to you by your health care provider. Make sure you discuss any questions you have with your health care provider.

## 2014-09-27 NOTE — ED Notes (Signed)
Pt given a turkey sandwich

## 2014-09-27 NOTE — ED Notes (Signed)
Pt back to room.
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
## 2014-09-27 NOTE — ED Notes (Signed)
PA at bedside.
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
## 2014-09-27 NOTE — ED Notes (Signed)
Pt to restroom

## 2014-09-27 NOTE — ED Notes (Signed)
Pt using the phone and states she wants to finish her phone call before talking to me

## 2014-09-28 ENCOUNTER — Encounter (HOSPITAL_COMMUNITY): Payer: Self-pay | Admitting: *Deleted

## 2014-09-28 ENCOUNTER — Emergency Department (HOSPITAL_COMMUNITY)
Admission: EM | Admit: 2014-09-28 | Discharge: 2014-09-30 | Disposition: A | Discharge status: Other Acute Inpatient Hospital | Payer: Medicaid Other | Attending: Emergency Medicine | Admitting: Emergency Medicine

## 2014-09-28 DIAGNOSIS — F1210 Cannabis abuse, uncomplicated: Secondary | ICD-10-CM | POA: Diagnosis not present

## 2014-09-28 DIAGNOSIS — Z3202 Encounter for pregnancy test, result negative: Secondary | ICD-10-CM | POA: Diagnosis not present

## 2014-09-28 DIAGNOSIS — F419 Anxiety disorder, unspecified: Secondary | ICD-10-CM | POA: Diagnosis not present

## 2014-09-28 DIAGNOSIS — R45851 Suicidal ideations: Secondary | ICD-10-CM | POA: Diagnosis not present

## 2014-09-28 DIAGNOSIS — Z862 Personal history of diseases of the blood and blood-forming organs and certain disorders involving the immune mechanism: Secondary | ICD-10-CM | POA: Insufficient documentation

## 2014-09-28 DIAGNOSIS — Z792 Long term (current) use of antibiotics: Secondary | ICD-10-CM | POA: Insufficient documentation

## 2014-09-28 DIAGNOSIS — R011 Cardiac murmur, unspecified: Secondary | ICD-10-CM | POA: Diagnosis not present

## 2014-09-28 DIAGNOSIS — F250 Schizoaffective disorder, bipolar type: Secondary | ICD-10-CM | POA: Diagnosis present

## 2014-09-28 DIAGNOSIS — Z720 Tobacco use: Secondary | ICD-10-CM | POA: Diagnosis not present

## 2014-09-28 DIAGNOSIS — F259 Schizoaffective disorder, unspecified: Secondary | ICD-10-CM | POA: Diagnosis not present

## 2014-09-28 DIAGNOSIS — Z046 Encounter for general psychiatric examination, requested by authority: Secondary | ICD-10-CM | POA: Diagnosis present

## 2014-09-28 DIAGNOSIS — F23 Brief psychotic disorder: Secondary | ICD-10-CM

## 2014-09-28 DIAGNOSIS — Z79899 Other long term (current) drug therapy: Secondary | ICD-10-CM | POA: Diagnosis not present

## 2014-09-28 DIAGNOSIS — F29 Unspecified psychosis not due to a substance or known physiological condition: Secondary | ICD-10-CM | POA: Diagnosis not present

## 2014-09-28 LAB — URINALYSIS, ROUTINE W REFLEX MICROSCOPIC
BILIRUBIN URINE: NEGATIVE
GLUCOSE, UA: NEGATIVE mg/dL
Nitrite: POSITIVE — AB
PH: 5.5 (ref 5.0–8.0)
PROTEIN: NEGATIVE mg/dL
Specific Gravity, Urine: 1.017 (ref 1.005–1.030)
UROBILINOGEN UA: 1 mg/dL (ref 0.0–1.0)

## 2014-09-28 LAB — COMPREHENSIVE METABOLIC PANEL
ALBUMIN: 4.9 g/dL (ref 3.5–5.0)
ALT: 22 U/L (ref 14–54)
AST: 27 U/L (ref 15–41)
Alkaline Phosphatase: 55 U/L (ref 38–126)
Anion gap: 10 (ref 5–15)
BUN: 10 mg/dL (ref 6–20)
CO2: 24 mmol/L (ref 22–32)
CREATININE: 0.94 mg/dL (ref 0.44–1.00)
Calcium: 9.3 mg/dL (ref 8.9–10.3)
Chloride: 104 mmol/L (ref 101–111)
GFR calc Af Amer: >60 mL/min (ref >60)
GFR calc non Af Amer: >60 mL/min (ref >60)
GLUCOSE: 82 mg/dL (ref 65–99)
Potassium: 3.4 mmol/L — ABNORMAL LOW (ref 3.5–5.1)
SODIUM: 138 mmol/L (ref 135–145)
TOTAL PROTEIN: 7.9 g/dL (ref 6.5–8.1)
Total Bilirubin: 1.7 mg/dL — ABNORMAL HIGH (ref 0.3–1.2)

## 2014-09-28 LAB — POC URINE PREG, ED: Preg Test, Ur: NEGATIVE

## 2014-09-28 LAB — CBC
HCT: 39.7 % (ref 36.0–46.0)
Hemoglobin: 14.1 g/dL (ref 12.0–15.0)
MCH: 32.3 pg (ref 26.0–34.0)
MCHC: 35.5 g/dL (ref 30.0–36.0)
MCV: 90.8 fL (ref 78.0–100.0)
PLATELETS: 215 10*3/uL (ref 150–400)
RBC: 4.37 MIL/uL (ref 3.87–5.11)
RDW: 12.7 % (ref 11.5–15.5)
WBC: 8.3 10*3/uL (ref 4.0–10.5)

## 2014-09-28 LAB — URINE MICROSCOPIC-ADD ON

## 2014-09-28 LAB — RAPID URINE DRUG SCREEN, HOSP PERFORMED
Amphetamines: NOT DETECTED
BARBITURATES: NOT DETECTED
Benzodiazepines: NOT DETECTED
COCAINE: NOT DETECTED
Opiates: NOT DETECTED
Tetrahydrocannabinol: POSITIVE — AB

## 2014-09-28 LAB — GC/CHLAMYDIA PROBE AMP (~~LOC~~) NOT AT ARMC
Chlamydia: NEGATIVE
Neisseria Gonorrhea: NEGATIVE

## 2014-09-28 LAB — SALICYLATE LEVEL: Salicylate Lvl: <4 mg/dL (ref 2.8–30.0)

## 2014-09-28 LAB — ACETAMINOPHEN LEVEL: Acetaminophen (Tylenol), Serum: <10 ug/mL — ABNORMAL LOW (ref 10–30)

## 2014-09-28 LAB — ETHANOL: Alcohol, Ethyl (B): <5 mg/dL (ref <5)

## 2014-09-28 MED ORDER — LORAZEPAM 1 MG PO TABS
1.0000 mg | ORAL_TABLET | Freq: Once | ORAL | Status: AC
  Administered 2014-09-28: 1 mg via ORAL
  Filled 2014-09-28: qty 1

## 2014-09-28 MED ORDER — POTASSIUM CHLORIDE CRYS ER 20 MEQ PO TBCR
20.0000 meq | EXTENDED_RELEASE_TABLET | Freq: Once | ORAL | Status: AC
  Administered 2014-09-28: 20 meq via ORAL
  Filled 2014-09-28: qty 1

## 2014-09-28 MED ORDER — CEPHALEXIN 500 MG PO CAPS
500.0000 mg | ORAL_CAPSULE | Freq: Two times a day (BID) | ORAL | Status: DC
  Administered 2014-09-28 – 2014-09-30 (×4): 500 mg via ORAL
  Filled 2014-09-28 (×4): qty 1

## 2014-09-28 MED ORDER — HALOPERIDOL 5 MG PO TABS
5.0000 mg | ORAL_TABLET | Freq: Once | ORAL | Status: AC
  Administered 2014-09-28: 5 mg via ORAL
  Filled 2014-09-28: qty 1

## 2014-09-28 MED ORDER — ONDANSETRON 4 MG PO TBDP
4.0000 mg | ORAL_TABLET | Freq: Once | ORAL | Status: AC
  Administered 2014-09-28: 4 mg via ORAL
  Filled 2014-09-28: qty 1

## 2014-09-28 MED ORDER — LORAZEPAM 1 MG PO TABS
1.0000 mg | ORAL_TABLET | Freq: Once | ORAL | Status: AC
Start: 2014-09-28 — End: 2014-09-28
  Administered 2014-09-28: 1 mg via ORAL
  Filled 2014-09-28: qty 1
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
## 2014-09-28 NOTE — ED Notes (Signed)
Patient appears drowsy. Denies pain and recent falls at present. States that she is hungry but appears to drowsy to answer questions at this time.   Encouragement offered.  Encouraged to rest and give medication time before eating her snack.  Q 15 safety checks in place.

## 2014-09-28 NOTE — ED Notes (Signed)
Telepsych in progress. 
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
## 2014-09-28 NOTE — ED Notes (Signed)
[facility] called back stating they are not taking pt back

## 2014-09-28 NOTE — ED Notes (Signed)
Bed: [facility] Expected date: 09/28/14 Expected time: 11:12 PM Means of arrival:  Comments: [employer]
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
## 2014-09-28 NOTE — ED Notes (Signed)
Pt being sent from [facility]. Pt needs medical clearance and possible meds to calm her down before she can go back to [place]. Pt hx of schizoaffective disorder, pt is psychotic at this time and aggressive. Positive for substance abuse. Pt was telling Vesta Mixer that she thinks she has as UTI and is pregnant. [facility] states negative pregnancy test.

## 2014-09-28 NOTE — ED Notes (Signed)
Called [facility] to inform them that pt's results are faxed over.  Personnel asked how pt is doing.  She's made aware that pt has not been combative, or kicking but is still loud at times and yelling.  Staff states "well we can't take her back if she's still loud and psychotic and combative because we can't medicate her here."  She states that she will talk with the psychiatrist on call and will call back with a decision whether they will take pt or not.  Madilyn Hook EDP made aware.

## 2014-09-29 DIAGNOSIS — F250 Schizoaffective disorder, bipolar type: Secondary | ICD-10-CM | POA: Diagnosis present

## 2014-09-29 DIAGNOSIS — R45851 Suicidal ideations: Secondary | ICD-10-CM

## 2014-09-29 LAB — URINE CULTURE: SPECIAL REQUESTS: NORMAL

## 2014-09-29 MED ORDER — TRIHEXYPHENIDYL HCL 5 MG PO TABS
5.0000 mg | ORAL_TABLET | Freq: Two times a day (BID) | ORAL | Status: DC
  Administered 2014-09-29 – 2014-09-30 (×2): 5 mg via ORAL
  Filled 2014-09-29 (×4): qty 1

## 2014-09-29 MED ORDER — MENTHOL 3 MG MT LOZG
1.0000 | LOZENGE | Freq: Three times a day (TID) | OROMUCOSAL | Status: DC | PRN
  Filled 2014-09-29: qty 9

## 2014-09-29 MED ORDER — QUETIAPINE FUMARATE 100 MG PO TABS
100.0000 mg | ORAL_TABLET | Freq: Every day | ORAL | Status: DC
  Administered 2014-09-29: 100 mg via ORAL
  Filled 2014-09-29: qty 1

## 2014-09-29 MED ORDER — LORAZEPAM 0.5 MG PO TABS: 1.0000 mg | ORAL_TABLET | Freq: Three times a day (TID) | ORAL | Status: DC | PRN

## 2014-09-29 MED ORDER — LORAZEPAM 0.5 MG PO TABS
2.0000 mg | ORAL_TABLET | Freq: Once | ORAL | Status: AC
  Administered 2014-09-29: 2 mg via ORAL
  Filled 2014-09-29: qty 4

## 2014-09-29 MED ORDER — ARIPIPRAZOLE 15 MG PO TABS
15.0000 mg | ORAL_TABLET | Freq: Every day | ORAL | Status: DC
  Administered 2014-09-29 – 2014-09-30 (×2): 15 mg via ORAL
  Filled 2014-09-29 (×3): qty 1

## 2014-09-29 MED ORDER — HYDROXYZINE HCL 25 MG PO TABS
50.0000 mg | ORAL_TABLET | Freq: Four times a day (QID) | ORAL | Status: DC | PRN
  Administered 2014-09-30: 50 mg via ORAL
  Filled 2014-09-29: qty 2

## 2014-09-29 MED ORDER — CLONAZEPAM 1 MG PO TABS
1.0000 mg | ORAL_TABLET | Freq: Two times a day (BID) | ORAL | Status: DC
  Administered 2014-09-29 – 2014-09-30 (×3): 1 mg via ORAL
  Filled 2014-09-29 (×3): qty 1

## 2014-09-29 NOTE — BH Assessment (Signed)
[facility] Assessment Progress Note  The following facilities have been contacted to seek placement for this pt, with results as noted:   Beds available, information sent, decision pending:  [facility]   Bed status unspecified, but accepting referrals; information sent, decision pending:  [facility]   At capacity:  [place] [facility], [place] Triage Specialist [phone]

## 2014-09-29 NOTE — ED Notes (Signed)
Pt. Noted sleeping in room. No complaints or concerns voiced. No distress or abnormal behavior noted. Will continue to monitor with security cameras. Q 15 minute rounds continue. 

## 2014-09-29 NOTE — Progress Notes (Deleted)
Referral for patient was followed up at the following facilities: [facility] - per Wyatt Mage, fax referral, beds open. [facility] - per Candice, fax it for the waitlist. [facility] - per intake, beds open for everyone. Referral faxed. [facility] - per Peyton Najjar, patients in ED, referral to be reviewed later if beds still open. [facility] - per intake, fax referral for review. [facility] - left voicemail. [facility] - per Dennie Bible, fax referral.   [facility] - left voicemail with call back number if beds open.  At capacity: [place]  [facility] - per intake, at capacity for adults, 4 adolescent beds open. [facility] - per Denzil Hughes, [place] Disposition staff 09/29/2014 7:33 PM

## 2014-09-29 NOTE — ED Notes (Signed)
Pt. To nurses station with c/o drowsiness related to her medications. Pt. Sits in [place] and lays down to impress nursing staff that she is indeed sleepy. Pt. escorted to her room and to bed, side rails up bed in low position with call bell in reach.

## 2014-09-29 NOTE — ED Notes (Signed)
Pt. Noted in room. No acute distress noted. Pt. States she is getting sleepy from her medications. Pt. Helped to her room and to bed. Will continue to monitor with security cameras. Q 15 minute rounds continue.

## 2014-09-29 NOTE — Consult Note (Signed)
[facility] Consult   Reason for Consult: manic episode, suicidal thoughts, paranoid delusions Referring Physician: EDP Patient Identification: Betty Parrish MRN:  [id number] Principal Diagnosis: Schizoaffective disorder, bipolar type Diagnosis:   Patient Active Problem List   Diagnosis Date Noted  . Schizoaffective disorder, bipolar type [F25.0] 09/29/2014    Priority: High  . Schizoaffective disorder [F25.9] 12/18/2012  . Mania [F30.9] 12/18/2012    Total Time spent with patient: 45 minutes  Subjective:   Betty Parrish is a 24 y.o. female patient admitted with aggressive, bizarre and disorganized behavior.  HPI: Patient is a 24 year old woman with history of Schizoaffective disorder-bipolar type. Patient was brought to [facility] on IVC by [facility] security due to agitation, aggression, bizarre, disorganized behavior and suicidal thoughts.Patient reports that she stopped taking her medications and has since been drinking alcohol, using cocaine, and smoking Marijuana. Patient reports being paranoid, she reports a recurrent thoughts of people kidnapping her 62 year old daughter, stating that some gang members are after her, and has not been sleeping for days. Patient has pressured speech, racing thoughts, poor concentration, gets easily distracted, hypervigilant, gets easily agitated, very labile and loud.  Patient is homeless, she reports occasional alcohol consumption, admits to using cocaine for the first time a couple of days ago and smokes marijuana multiple times in the week. Her urine toxicology is positive for Cannabis.Patient denies HI and A/V hallucinations. She is followed by an ACTT services from [facility] who gives her monthly injection of Abilify Maintenna, last injection was 2 weeks ago per patient.  HPI Elements:   Location:  manic episode, suicidal, paranoia. Quality:  severe. Duration:  for 2 weeks. Context:  non compliant with medication.  Past Medical  History:  Past Medical History  Diagnosis Date  . Anxiety   . Heart murmur   . Sickle cell trait   . Schizo-affective psychosis   . ADHD (attention deficit hyperactivity disorder)    History reviewed. No pertinent past surgical history. Family History:  Family History  Problem Relation Age of Onset  . Sickle cell anemia Mother    Social History:  History  Alcohol Use  . Yes     History  Drug Use  . Yes  . Special: Marijuana, Cocaine    History   Social History  . Marital Status: Single    Spouse Name: N/A  . Number of Children: N/A  . Years of Education: N/A   Social History Main Topics  . Smoking status: Current Every Day Smoker -- 1.00 packs/day    Types: Cigarettes  . Smokeless tobacco: Not on file  . Alcohol Use: Yes  . Drug Use: Yes    Special: Marijuana, Cocaine  . Sexual Activity: Yes    Birth Control/ Protection: IUD   Other Topics Concern  . None   Social History Narrative   Additional Social History:    Prescriptions: Abilify 10m and some other ones that patient cannot remember Over the Counter: Unknown History of alcohol / drug use?: Yes Name of Substance 1: Marijuana 1 - Age of First Use: 24years of age 24- Amount (size/oz): Amount varies 1 - Frequency: 3-4 times per week 1 - Duration: On-going 1 - Last Use / Amount: 07/20 Name of Substance 2: Cocaine 2 - Age of First Use: 24years of age 24- Amount (size/oz): Cliaims one time use 2 - Frequency: Once  2 - Duration: Claims one time use 2 - Last Use / Amount: 07/19  Allergies:   Allergies  Allergen Reactions  . [place] Dark Tanning With Sunscreen 2 Oil [Sunscreens]     Hives   . Red Dye     Listed on paperwork  . Zyprexa [Olanzapine] Other (See Comments)    Reaction is unknown    Labs:  Results for orders placed or performed during the hospital encounter of 09/28/14 (from the past 48 hour(s))  Comprehensive metabolic panel     Status: Abnormal    Collection Time: 09/28/14  6:25 PM  Result Value Ref Range   Sodium 138 135 - 145 mmol/L   Potassium 3.4 (L) 3.5 - 5.1 mmol/L   Chloride 104 101 - 111 mmol/L   CO2 24 22 - 32 mmol/L   Glucose, Bld 82 65 - 99 mg/dL   BUN 10 6 - 20 mg/dL   Creatinine, Ser 0.94 0.44 - 1.00 mg/dL   Calcium 9.3 8.9 - 10.3 mg/dL   Total Protein 7.9 6.5 - 8.1 g/dL   Albumin 4.9 3.5 - 5.0 g/dL   AST 27 15 - 41 U/L   ALT 22 14 - 54 U/L   Alkaline Phosphatase 55 38 - 126 U/L   Total Bilirubin 1.7 (H) 0.3 - 1.2 mg/dL   GFR calc non Af Amer >60 >60 mL/min   GFR calc Af Amer >60 >60 mL/min    Comment: (NOTE) The eGFR has been calculated using the CKD EPI equation. This calculation has not been validated in all clinical situations. eGFR's persistently <60 mL/min signify possible Chronic Kidney Disease.    Anion gap 10 5 - 15  Ethanol (ETOH)     Status: None   Collection Time: 09/28/14  6:25 PM  Result Value Ref Range   Alcohol, Ethyl (B) <5 <5 mg/dL    Comment:        LOWEST DETECTABLE LIMIT FOR SERUM ALCOHOL IS 5 mg/dL FOR MEDICAL PURPOSES ONLY   CBC     Status: None   Collection Time: 09/28/14  6:25 PM  Result Value Ref Range   WBC 8.3 4.0 - 10.5 K/uL   RBC 4.37 3.87 - 5.11 MIL/uL   Hemoglobin 14.1 12.0 - 15.0 g/dL   HCT 39.7 36.0 - 46.0 %   MCV 90.8 78.0 - 100.0 fL   MCH 32.3 26.0 - 34.0 pg   MCHC 35.5 30.0 - 36.0 g/dL   RDW 12.7 11.5 - 15.5 %   Platelets 215 150 - 400 K/uL  Acetaminophen level     Status: Abnormal   Collection Time: 09/28/14  6:25 PM  Result Value Ref Range   Acetaminophen (Tylenol), Serum <10 (L) 10 - 30 ug/mL    Comment:        THERAPEUTIC CONCENTRATIONS VARY SIGNIFICANTLY. A RANGE OF 10-30 ug/mL MAY BE AN EFFECTIVE CONCENTRATION FOR MANY PATIENTS. HOWEVER, SOME ARE BEST TREATED AT CONCENTRATIONS OUTSIDE THIS RANGE. ACETAMINOPHEN CONCENTRATIONS >150 ug/mL AT 4 HOURS AFTER INGESTION AND >50 ug/mL AT 12 HOURS AFTER INGESTION ARE OFTEN ASSOCIATED WITH  TOXIC REACTIONS.   Salicylate level     Status: None   Collection Time: 09/28/14  6:25 PM  Result Value Ref Range   Salicylate Lvl <7.2 2.8 - 30.0 mg/dL  Urine rapid drug screen (hosp performed) (Not at [facility])     Status: Abnormal   Collection Time: 09/28/14  8:31 PM  Result Value Ref Range   Opiates NONE DETECTED NONE DETECTED   Cocaine NONE DETECTED NONE DETECTED   Benzodiazepines NONE DETECTED NONE DETECTED  Amphetamines NONE DETECTED NONE DETECTED   Tetrahydrocannabinol POSITIVE (A) NONE DETECTED   Barbiturates NONE DETECTED NONE DETECTED    Comment:        DRUG SCREEN FOR MEDICAL PURPOSES ONLY.  IF CONFIRMATION IS NEEDED FOR ANY PURPOSE, NOTIFY LAB WITHIN 5 DAYS.        LOWEST DETECTABLE LIMITS FOR URINE DRUG SCREEN Drug Class       Cutoff (ng/mL) Amphetamine      1000 Barbiturate      200 Benzodiazepine   505 Tricyclics       397 Opiates          300 Cocaine          300 THC              50   Urinalysis, Routine w reflex microscopic (not at [facility])     Status: Abnormal   Collection Time: 09/28/14  8:31 PM  Result Value Ref Range   Color, Urine AMBER (A) YELLOW    Comment: BIOCHEMICALS MAY BE AFFECTED BY COLOR   APPearance CLOUDY (A) CLEAR   Specific Gravity, Urine 1.017 1.005 - 1.030   pH 5.5 5.0 - 8.0   Glucose, UA NEGATIVE NEGATIVE mg/dL   Hgb urine dipstick TRACE (A) NEGATIVE   Bilirubin Urine NEGATIVE NEGATIVE   Ketones, ur >80 (A) NEGATIVE mg/dL   Protein, ur NEGATIVE NEGATIVE mg/dL   Urobilinogen, UA 1.0 0.0 - 1.0 mg/dL   Nitrite POSITIVE (A) NEGATIVE   Leukocytes, UA MODERATE (A) NEGATIVE  Urine microscopic-add on     Status: Abnormal   Collection Time: 09/28/14  8:31 PM  Result Value Ref Range   Squamous Epithelial / LPF MANY (A) RARE   WBC, UA 21-50 <3 WBC/hpf   RBC / HPF 0-2 <3 RBC/hpf   Bacteria, UA MANY (A) RARE  POC urine preg, ED (not at [facility])     Status: None   Collection Time: 09/28/14  8:41 PM  Result Value Ref Range   Preg Test, Ur  NEGATIVE NEGATIVE    Comment:        THE SENSITIVITY OF THIS METHODOLOGY IS >24 mIU/mL     Vitals: Blood pressure 129/82, pulse 81, temperature 98.9 F (37.2 C), temperature source Oral, resp. rate 16, SpO2 100 %.  Risk to Self: Suicidal Ideation: No Suicidal Intent: No Is patient at risk for suicide?: No Suicidal Plan?: No Access to Means: No What has been your use of drugs/alcohol within the last 12 months?: Marijuana & cocaine How many times?: 1 Other Self Harm Risks: None Triggers for Past Attempts: None known Intentional Self Injurious Behavior: None Risk to Others: Homicidal Ideation: No Thoughts of Harm to Others: No Current Homicidal Intent: No Current Homicidal Plan: No Access to Homicidal Means: No Identified Victim: No one History of harm to others?: No Assessment of Violence:  (Pt was [facility] the walls at [facility] Violent Behavior Description: Will hit walls when upset Does patient have access to weapons?: No Criminal Charges Pending?: Yes Describe Pending Criminal Charges: Pt will not divulge Does patient have a court date: Yes Court Date:  (Does not answer question.) Prior Inpatient Therapy: Prior Inpatient Therapy: No Prior Therapy Dates:  (Pt will not talk.) Prior Therapy Facilty/Provider(s): Pt will not talk Reason for Treatment: Pt will not talk Prior Outpatient Therapy: Prior Outpatient Therapy: Yes Prior Therapy Dates: Last 4 years to current Prior Therapy Facilty/Provider(s): [facility] Reason for Treatment: ACTT team Does patient have an ACCT team?:  Yes Does patient have Intensive In-House Services?  : No Does patient have [facility] services? : No Does patient have P4CC services?: No  Current Facility-Administered Medications  Medication Dose Route Frequency Provider Last Rate Last Dose  . ARIPiprazole (ABILIFY) tablet 15 mg  15 mg Oral Daily Kyler Lerette   15 mg at 09/29/14 1108  . cephALEXin (KEFLEX) capsule 500 mg  500 mg Oral  BID Quintella Reichert, MD   500 mg at 09/29/14 1019  . clonazePAM (KLONOPIN) tablet 1 mg  1 mg Oral BID Ryman Rathgeber   1 mg at 09/29/14 1018  . QUEtiapine (SEROQUEL) tablet 100 mg  100 mg Oral QHS Braeley Buskey      . trihexyphenidyl (ARTANE) tablet 5 mg  5 mg Oral BID WC Layce Sprung       Current Outpatient Prescriptions  Medication Sig Dispense Refill  . ARIPiprazole (ABILIFY MAINTENA) 400 MG SUSR Inject 400 mg into the muscle every 30 (thirty) days.    . ARIPiprazole (ABILIFY) 15 MG tablet Take 15 mg by mouth daily.    . benztropine (COGENTIN) 0.5 MG tablet Take 0.5 mg by mouth 2 (two) times daily.    . haloperidol (HALDOL) 5 MG tablet Take 5 mg by mouth 2 (two) times daily.    Marland Kitchen levonorgestrel (MIRENA) 20 MCG/24HR IUD 1 each by Intrauterine route once.    . Naproxen Sod-Diphenhydramine (ALEVE PM) 220-25 MG TABS Take 2 tablets by mouth at bedtime as needed (sleep).    . traZODone (DESYREL) 100 MG tablet Take 100 mg by mouth at bedtime.    . cephALEXin (KEFLEX) 500 MG capsule Take 1 capsule (500 mg total) by mouth 4 (four) times daily. (Patient not taking: Reported on 09/29/2014) 20 capsule 0  . valACYclovir (VALTREX) 1000 MG tablet Take 1 tablet (1,000 mg total) by mouth 2 (two) times daily. (Patient not taking: Reported on 09/27/2014) 20 tablet 0    Musculoskeletal: Strength & Muscle Tone: within normal limits Gait & Station: normal Patient leans: N/A  Psychiatric Specialty Exam: Physical Exam  Psychiatric: Her mood appears anxious. Her affect is labile. Her speech is rapid and/or pressured. She is agitated and aggressive. Thought content is paranoid and delusional. Cognition and memory are normal. She expresses impulsivity. She expresses suicidal ideation.    Review of Systems  Constitutional: Negative.   HENT: Negative.   Eyes: Negative.   Respiratory: Negative.   Cardiovascular: Negative.   Gastrointestinal: Negative.   Genitourinary: Negative.   Musculoskeletal:  Negative.   Skin: Negative.   Neurological: Negative.   Endo/Heme/Allergies: Negative.   Psychiatric/Behavioral: Positive for suicidal ideas and substance abuse. The patient is nervous/anxious and has insomnia.     Blood pressure 129/82, pulse 81, temperature 98.9 F (37.2 C), temperature source Oral, resp. rate 16, SpO2 100 %.There is no weight on file to calculate BMI.  General Appearance: Casual  Eye Contact::  Good  Speech:  Pressured  Volume:  Increased  Mood:  Anxious, Euphoric and Irritable  Affect:  Labile  Thought Process:  Disorganized and Loose  Orientation:  Full (Time, Place, and Person)  Thought Content:  Delusions and Paranoid Ideation  Suicidal Thoughts:  Yes.  with intent/plan  Homicidal Thoughts:  No  Memory:  Immediate;   Good Recent;   Good Remote;   Good  Judgement:  Impaired  Insight:  Lacking  Psychomotor Activity:  Increased  Concentration:  Poor  Recall:  [place] of Knowledge:Fair  Language: Good  Akathisia:  No  Handed:  Right  AIMS (if indicated):     Assets:  Communication Skills Desire for Improvement Social Support  ADL's:  Intact  Cognition: WNL  Sleep:   poor   Medical Decision Making: Review or order clinical lab tests (1), Established Problem, Worsening (2), Review of Medication Regimen & Side Effects (2) and Review of New Medication or Change in Dosage (2)  Treatment Plan Summary: Daily contact with patient to assess and evaluate symptoms and progress in treatment and  Medication management Assessment: Schizoaffective disorder-Bipolar type Plan: Admit to inpatient for stabilization Medication: Abilify 74m daily for Bipolar disorder. Seroquel 107mqhs for insomnia. Clonazepam 5m37mid for agitation  Plan:  Recommend psychiatric Inpatient admission when medically cleared. Disposition: as above.  AkiCorena PilgrimD 09/29/2014 11:21 AM

## 2014-09-29 NOTE — ED Notes (Signed)
Pt. Noted sleeping in room. No acute distress noted. Pt. States she wants to leave. Pt. Informed as to her situation and the need to talk to the Psychiatrist in the AM. Pt. shouted an the way back to her room. Will continue to monitor with security cameras. Q 15 minute rounds continue.

## 2014-09-29 NOTE — Clinical Social Work Note (Signed)
Called pt's father for collateral information.  He revealed the following:  Pt called him a few days ago and she was not taking her medications.  Pt's father stated that "pt was talking like she was on crack".  Pt's father stated that she then found 2 of her pills and began to sound more stable with her communication.    Pt's father discussed pt's history as recently living with her four year old daughter's father's family.  While living there pt slept with all of the men in the home as well as the child's father and was recently thrown out of this residence.  He stated that pt is now allowing the mother of the child's father to raise the child.    Pt's father stated that he knew pt smoked marijuana every day but was unsure of any other drugs she did.  He stated that the people pt lived with sometimes laced her marijuana with other drugs but was unsure what they were.    Pt's father stated that pt is currently on probation for either petty theft or trespassing.  He also stated that pt recently received a new charge for prostitution.    Pt's father stated that he currently lives in [place] and would be happy to provide any additional information if needed.  Left a message for pt's mother who resides in town but did not receive a call back.  Obtained release from pt to be able to communicate with [facility] where pt is currently a client.   Elray Buba, LCSW [facility] Clinical Social Worker - Weekend Coverage cell #: [phone]

## 2014-09-29 NOTE — ED Notes (Signed)
Report received from [place] RN. Pt. Sleeping, respirations regular and unlabored. Will continue to monitor for safety via security cameras and Q 15 minute checks. 

## 2014-09-29 NOTE — ED Notes (Signed)
Pt. Noted in [place]. No acute distress noted. Will continue to monitor with security cameras. Q 15 minute rounds continue.

## 2014-09-29 NOTE — Clinical Social Work Note (Signed)
Called [facility] to gather information regarding pt  Requested pt's current medications.  [facility] stated that they would fax over [facility]   [facility] stated that they have been working with pt since September 2013 and they were sending over her representative to see pt at [employer] today  Reviewed paperwork and MARs that were sent are blank.  CSW will follow up with obtaining information tomorrow  if necessary   .Elray Buba, LCSW [facility] Clinical Social Worker - Weekend Coverage cell #: [phone]

## 2014-09-29 NOTE — ED Notes (Signed)
Patient is currently sleeping and did not arouse when name was called. The attending RN was notified and vitals will be assessed at a later time.

## 2014-09-30 ENCOUNTER — Inpatient Hospital Stay (HOSPITAL_COMMUNITY)
Admission: AD | Admit: 2014-09-30 | Discharge: 2014-10-04 | DRG: 885 | Disposition: A | Discharge status: Home / Self Care | Payer: Medicaid Other | Source: Intra-hospital | Attending: Psychiatry | Admitting: Psychiatry

## 2014-09-30 ENCOUNTER — Encounter (HOSPITAL_COMMUNITY): Payer: Self-pay | Admitting: *Deleted

## 2014-09-30 DIAGNOSIS — G4700 Insomnia, unspecified: Secondary | ICD-10-CM | POA: Diagnosis present

## 2014-09-30 DIAGNOSIS — F29 Unspecified psychosis not due to a substance or known physiological condition: Secondary | ICD-10-CM | POA: Diagnosis not present

## 2014-09-30 DIAGNOSIS — F250 Schizoaffective disorder, bipolar type: Secondary | ICD-10-CM | POA: Diagnosis present

## 2014-09-30 DIAGNOSIS — D573 Sickle-cell trait: Secondary | ICD-10-CM | POA: Diagnosis present

## 2014-09-30 DIAGNOSIS — F17210 Nicotine dependence, cigarettes, uncomplicated: Secondary | ICD-10-CM | POA: Diagnosis present

## 2014-09-30 DIAGNOSIS — F3110 Bipolar disorder, current episode manic without psychotic features, unspecified: Secondary | ICD-10-CM | POA: Diagnosis not present

## 2014-09-30 DIAGNOSIS — F1210 Cannabis abuse, uncomplicated: Secondary | ICD-10-CM | POA: Diagnosis present

## 2014-09-30 DIAGNOSIS — F319 Bipolar disorder, unspecified: Principal | ICD-10-CM | POA: Diagnosis present

## 2014-09-30 DIAGNOSIS — F909 Attention-deficit hyperactivity disorder, unspecified type: Secondary | ICD-10-CM | POA: Diagnosis present

## 2014-09-30 DIAGNOSIS — F410 Panic disorder [episodic paroxysmal anxiety] without agoraphobia: Secondary | ICD-10-CM | POA: Diagnosis present

## 2014-09-30 MED ORDER — ONDANSETRON 4 MG PO TBDP
4.0000 mg | ORAL_TABLET | Freq: Once | ORAL | Status: AC
  Administered 2014-09-30: 4 mg via ORAL
  Filled 2014-09-30: qty 1

## 2014-09-30 MED ORDER — IBUPROFEN 600 MG PO TABS
600.0000 mg | ORAL_TABLET | Freq: Four times a day (QID) | ORAL | Status: DC | PRN
  Administered 2014-09-30 – 2014-10-03 (×5): 600 mg via ORAL
  Filled 2014-09-30 (×5): qty 1

## 2014-09-30 MED ORDER — CLONAZEPAM 1 MG PO TABS
1.0000 mg | ORAL_TABLET | Freq: Two times a day (BID) | ORAL | Status: DC
  Administered 2014-09-30 – 2014-10-04 (×8): 1 mg via ORAL
  Filled 2014-09-30 (×8): qty 1

## 2014-09-30 MED ORDER — ACETAMINOPHEN 325 MG PO TABS
650.0000 mg | ORAL_TABLET | Freq: Once | ORAL | Status: AC
  Administered 2014-09-30: 650 mg via ORAL
  Filled 2014-09-30: qty 2

## 2014-09-30 MED ORDER — NICOTINE 21 MG/24HR TD PT24
21.0000 mg | MEDICATED_PATCH | Freq: Every day | TRANSDERMAL | Status: DC
  Administered 2014-10-01 – 2014-10-02 (×2): 21 mg via TRANSDERMAL
  Filled 2014-09-30 (×4): qty 1
  Filled 2014-09-30: qty 14
  Filled 2014-09-30: qty 1

## 2014-09-30 MED ORDER — CEPHALEXIN 500 MG PO CAPS
500.0000 mg | ORAL_CAPSULE | Freq: Two times a day (BID) | ORAL | Status: DC
  Administered 2014-09-30 – 2014-10-04 (×8): 500 mg via ORAL
  Filled 2014-09-30 (×8): qty 1
  Filled 2014-09-30: qty 2
  Filled 2014-09-30 (×2): qty 1

## 2014-09-30 MED ORDER — HYDROXYZINE HCL 50 MG PO TABS
50.0000 mg | ORAL_TABLET | Freq: Four times a day (QID) | ORAL | Status: DC | PRN
  Administered 2014-10-03: 50 mg via ORAL
  Filled 2014-09-30: qty 1
  Filled 2014-09-30: qty 28
  Filled 2014-09-30: qty 1

## 2014-09-30 MED ORDER — ARIPIPRAZOLE 15 MG PO TABS
15.0000 mg | ORAL_TABLET | Freq: Every day | ORAL | Status: DC
  Administered 2014-10-01 – 2014-10-02 (×2): 15 mg via ORAL
  Filled 2014-09-30 (×4): qty 1

## 2014-09-30 MED ORDER — LORAZEPAM 1 MG PO TABS
1.0000 mg | ORAL_TABLET | Freq: Three times a day (TID) | ORAL | Status: DC | PRN
  Administered 2014-10-01 – 2014-10-03 (×3): 1 mg via ORAL
  Filled 2014-09-30 (×3): qty 1

## 2014-09-30 MED ORDER — MENTHOL 3 MG MT LOZG: 1.0000 | LOZENGE | Freq: Three times a day (TID) | OROMUCOSAL | Status: DC | PRN

## 2014-09-30 MED ORDER — QUETIAPINE FUMARATE 100 MG PO TABS
100.0000 mg | ORAL_TABLET | Freq: Every day | ORAL | Status: DC
  Administered 2014-09-30: 100 mg via ORAL
  Filled 2014-09-30 (×3): qty 1

## 2014-09-30 MED ORDER — NICOTINE 21 MG/24HR TD PT24
21.0000 mg | MEDICATED_PATCH | Freq: Every day | TRANSDERMAL | Status: DC
  Administered 2014-09-30: 21 mg via TRANSDERMAL
  Filled 2014-09-30: qty 1

## 2014-09-30 MED ORDER — TRIHEXYPHENIDYL HCL 5 MG PO TABS
5.0000 mg | ORAL_TABLET | Freq: Two times a day (BID) | ORAL | Status: DC
  Administered 2014-10-01 – 2014-10-02 (×4): 5 mg via ORAL
  Filled 2014-09-30 (×7): qty 1

## 2014-09-30 NOTE — ED Notes (Signed)
Pt has been accepted to [facility] 507-1 and can transport in approx 45 mid per [facility]

## 2014-09-30 NOTE — Progress Notes (Addendum)
Pt is 23yo female who comes to [facility] today for " my medicine". She says she is schizophrenic,  she carries sickle cell trait,  Has ADHD, and " I need my medicine" ( she yells)..  and then she moves onto the next subject. She shares that her ACT team sent her to the ED ( ? Not sure why) , that she has a 46 yo daughter and lives with her mother. Upon further review of the hisotry available in her chart, she has been IVC'd by [facility] ( who wont take her back) and was sent to Ed for med clearance ( r/o IUP{) . She is manic, loud uses threatening language and also watches staff after she has yelled vulgar language,...as  If   to gauge staff's response. She  Is a poor historian.Says she did some cocaine yesterday and " went crazy" but that she doesn't usually do it and that was the first time. She says her mother has sickel cell disorder but she carries trait only. She is not sure who has her daughter and is focused on getting attention ( here at [facility] ) more than anything right now.She contracts for safety, No Roommate Order obtained from physician ( due to pt's poor boundaries and loudness) and pt is admitted.

## 2014-09-30 NOTE — ED Notes (Signed)
Pt. Noted sleeping in room. No complaints or concerns voiced. No distress or abnormal behavior noted. Will continue to monitor with security cameras. Q 15 minute rounds continue. 

## 2014-09-30 NOTE — ED Notes (Signed)
Contact information for [facility] given to patient, pt to call her father to inform him of transfer

## 2014-09-30 NOTE — Progress Notes (Signed)
3:31pm. CSW spoke with pt about disposition. Pt expressed frustration about having to go to another hospital, and she asked if she was on the waitlist for a "correctional institution." CSW provided supportive counseling. CSW informed pt she had been accepted to [facility]. Pt in agreement. Pt is under IVC--will need to transport via sheriff.   Pt requested that CSW call her ACT team, [facility] [phone]). Spoke with Genevie Cheshire and communicated pt's wish that someone from ACT team visit her when she transfers. Genevie Cheshire stated she would inform her therapist and see if she could come out tomorrow. CSW communicated this to pt.  [place] Spaniel [facility] Clinical Social Worker [facility] phone: [phone]

## 2014-09-30 NOTE — ED Notes (Signed)
On the phone 

## 2014-09-30 NOTE — ED Notes (Signed)
Pt ambulatory w/o difficuity to [facility] w/ [facility], belongings given to officers.

## 2014-09-30 NOTE — ED Notes (Signed)
Up in [place] talking w/ other pt.  Loud at times,but redirectable

## 2014-09-30 NOTE — Progress Notes (Signed)
Adult Psychoeducational Group Note  Date:  09/30/2014 Time:  9:36 PM  Group Topic/Focus:  Wrap-Up Group:   The focus of this group is to help patients review their daily goal of treatment and discuss progress on daily workbooks.  Participation Level:  Active  Participation Quality:  Appropriate  Affect:  Appropriate  Cognitive:  Appropriate  Insight: Appropriate  Engagement in Group:  Engaged  Modes of Intervention:  Discussion  Additional Comments: The patient attended wrap up group.  Octavio Manns 09/30/2014, 9:36 PM

## 2014-09-30 NOTE — ED Notes (Signed)
Pt ambulatory w/o difficulty to [facility] w/ [name], belongings sent with oofficers

## 2014-09-30 NOTE — ED Notes (Signed)
Pt denies si/hi/avh at this time.  Pt reports that she has been off of her meds for at least 6 months and wants to get back on them.  Pt reports that her mind races and she has difficulty focusing.  Pt report that she was on depekote at on time and that it worked well for her.

## 2014-09-30 NOTE — ED Notes (Addendum)
Up to the bathroom to shower and change scrubs. Has been talking w/ other pt loud at times, but redirectable

## 2014-09-30 NOTE — ED Notes (Signed)
[facility] will call back for report 

## 2014-09-30 NOTE — ED Notes (Signed)
Talking w/ CSW 

## 2014-09-30 NOTE — ED Notes (Signed)
[name] contacted for transport 

## 2014-09-30 NOTE — ED Notes (Signed)
Talking w/ other pt in day room 

## 2014-09-30 NOTE — Tx Team (Addendum)
Initial Interdisciplinary Treatment Plan   PATIENT STRESSORS: Educational concerns Financial difficulties Health problems Marital or family conflict Medication change or noncompliance   PATIENT STRENGTHS: Ability for insight Active sense of humor Average or above average intelligence Capable of independent living   PROBLEM LIST: Problem List/Patient Goals Date to be addressed Date deferred Reason deferred Estimated date of resolution  " I need my depakote" 10/01/14     " " gotta get Arlan Organ here" 7//24/16     Schizophrenia 10/01/14                                          DISCHARGE CRITERIA:  Ability to meet basic life and health needs Adequate post-discharge living arrangements Improved stabilization in mood, thinking, and/or behavior Medical problems require only outpatient monitoring Motivation to continue treatment in a less acute level of care  PRELIMINARY DISCHARGE PLAN: Attend aftercare/continuing care group Attend PHP/IOP Attend 12-step recovery group  PATIENT/FAMIILY INVOLVEMENT: This treatment plan has been presented to and reviewed with the patient, Betty Parrish, and/or family member,   The patient and family have been given the opportunity to ask questions and make suggestions.  Rich Brave 09/30/2014, 6:08 PM

## 2014-09-30 NOTE — ED Notes (Signed)
Pt c/o of HA and nausea, EDP updated, orders recieved

## 2014-10-01 ENCOUNTER — Encounter (HOSPITAL_COMMUNITY): Payer: Self-pay | Admitting: Registered Nurse

## 2014-10-01 DIAGNOSIS — F1210 Cannabis abuse, uncomplicated: Secondary | ICD-10-CM | POA: Insufficient documentation

## 2014-10-01 DIAGNOSIS — F250 Schizoaffective disorder, bipolar type: Secondary | ICD-10-CM

## 2014-10-01 LAB — URINE CULTURE

## 2014-10-01 MED ORDER — ONDANSETRON 4 MG PO TBDP
4.0000 mg | ORAL_TABLET | Freq: Three times a day (TID) | ORAL | Status: DC | PRN
  Administered 2014-10-02: 4 mg via ORAL
  Filled 2014-10-01: qty 1

## 2014-10-01 MED ORDER — ZIPRASIDONE MESYLATE 20 MG IM SOLR
20.0000 mg | Freq: Once | INTRAMUSCULAR | Status: DC
Start: 2014-10-01 — End: 2014-10-01

## 2014-10-01 MED ORDER — ZIPRASIDONE HCL 20 MG PO CAPS
20.0000 mg | ORAL_CAPSULE | Freq: Two times a day (BID) | ORAL | Status: DC
  Administered 2014-10-02 (×2): 20 mg via ORAL
  Filled 2014-10-01 (×5): qty 1

## 2014-10-01 MED ORDER — DIVALPROEX SODIUM ER 500 MG PO TB24
500.0000 mg | ORAL_TABLET | Freq: Two times a day (BID) | ORAL | Status: DC
  Administered 2014-10-01 – 2014-10-04 (×7): 500 mg via ORAL
  Filled 2014-10-01 (×3): qty 1
  Filled 2014-10-01: qty 28
  Filled 2014-10-01 (×5): qty 1
  Filled 2014-10-01: qty 28
  Filled 2014-10-01: qty 1

## 2014-10-01 MED ORDER — TRAZODONE HCL 150 MG PO TABS
150.0000 mg | ORAL_TABLET | Freq: Every day | ORAL | Status: DC
  Administered 2014-10-01 – 2014-10-03 (×3): 150 mg via ORAL
  Filled 2014-10-01: qty 14
  Filled 2014-10-01 (×5): qty 1

## 2014-10-01 NOTE — Progress Notes (Signed)
Adult Psychoeducational Group Note  Date:  10/01/2014 Time:  9:41 PM  Group Topic/Focus:  Wrap-Up Group:   The focus of this group is to help patients review their daily goal of treatment and discuss progress on daily workbooks.  Participation Level:  Active  Participation Quality:  Appropriate  Affect:  Appropriate  Cognitive:  Appropriate  Insight: Appropriate  Engagement in Group:  Engaged  Modes of Intervention:  Discussion  Additional Comments: The patient attended group.  Octavio Manns 10/01/2014, 9:41 PM

## 2014-10-01 NOTE — [facility] Suicide Risk Assessment (Signed)
[facility] Admission Suicide Risk Assessment   Nursing information obtained from:    Demographic factors:  Adolescent or young adult, Unemployed Current Mental Status:  Suicidal ideation indicated by patient, NA Loss Factors:  Loss of significant relationship, Financial problems / change in socioeconomic status Historical Factors:  Impulsivity, Domestic violence Risk Reduction Factors:  Responsible for children under 25 years of age, Sense of responsibility to family, Living with another person, especially a relative, Positive social support, Positive therapeutic relationship Total Time spent with patient: 30 minutes Principal Problem: Bipolar disorder, manic phase Diagnosis:   Patient Active Problem List   Diagnosis Date Noted  . Bipolar disorder, manic phase [F31.10] 09/30/2014    Priority: High  . Schizoaffective disorder, bipolar type [F25.0] 09/29/2014    Priority: High  . Schizoaffective disorder [F25.9] 12/18/2012  . Mania [F30.9] 12/18/2012     Continued Clinical Symptoms:  Alcohol Use Disorder Identification Test Final Score (AUDIT): 4 The "Alcohol Use Disorders Identification Test", Guidelines for Use in Primary Care, Second Edition.  World Science writer [facility]). Score between 0-7:  no or low risk or alcohol related problems. Score between 8-15:  moderate risk of alcohol related problems. Score between 16-19:  high risk of alcohol related problems. Score 20 or above:  warrants further diagnostic evaluation for alcohol dependence and treatment.   CLINICAL FACTORS:   Severe Anxiety and/or Agitation Bipolar Disorder:   Mixed State Alcohol/Substance Abuse/Dependencies Previous Psychiatric Diagnoses and Treatments   Musculoskeletal: Strength & Muscle Tone: within normal limits Gait & Station: normal Patient leans: N/A  Psychiatric Specialty Exam: Physical Exam  Psychiatric: Her mood appears anxious. Her affect is angry and labile. Her speech is rapid and/or pressured  and tangential. She is agitated, aggressive, hyperactive and combative. Thought content is paranoid and delusional. Cognition and memory are normal. She expresses impulsivity.    Review of Systems  Constitutional: Negative.   HENT: Negative.   Eyes: Negative.   Respiratory: Negative.   Cardiovascular: Negative.   Gastrointestinal: Positive for nausea.  Genitourinary: Negative.   Musculoskeletal: Negative.   Skin: Negative.   Endo/Heme/Allergies: Negative.   Psychiatric/Behavioral: Positive for substance abuse. The patient is nervous/anxious and has insomnia.     Blood pressure 107/75, pulse 59, temperature 97.6 F (36.4 C), temperature source Oral, resp. rate 20, height 5' 7.5" (1.715 m), weight 94.348 kg (208 lb), last menstrual period 09/22/2014, SpO2 100 %.Body mass index is 32.08 kg/(m^2).  General Appearance: Casual  Eye Contact::  Good  Speech:  Pressured  Volume:  Increased  Mood:  Angry and Irritable  Affect:  Labile  Thought Process:  Circumstantial and Disorganized  Orientation:  Full (Time, Place, and Person)  Thought Content:  Delusions and grandiose  Suicidal Thoughts:  No  Homicidal Thoughts:  No  Memory:  Immediate;   Fair Recent;   Fair Remote;   Good  Judgement:  Impaired  Insight:  Lacking  Psychomotor Activity:  Increased  Concentration:  Fair  Recall:  [employer] of Knowledge:Fair  Language: Good  Akathisia:  No  Handed:  Right  AIMS (if indicated):     Assets:  Communication Skills Desire for Improvement Physical Health  Sleep:  Number of Hours: 5.5  Cognition: WNL  ADL's:  Intact     COGNITIVE FEATURES THAT CONTRIBUTE TO RISK:  Closed-mindedness and Polarized thinking    SUICIDE RISK:   Minimal: No identifiable suicidal ideation.  Patients presenting with no risk factors but with morbid ruminations; may be classified as minimal  risk based on the severity of the depressive symptoms  PLAN OF CARE: 1. Admit for crisis management and  stabilization. 2. Medication management to reduce current symptoms to base line and improve the  patient's overall level of functioning 3. Treat health problems as indicated. 4. Develop treatment plan to decrease risk of relapse upon discharge and the need for readmission. 5. Psycho-social education regarding relapse prevention and self care. 6. Health care follow up as needed for medical problems. 7. Restart home medications where appropriate.   Medical Decision Making:  Review or order clinical lab tests (1), Established Problem, Worsening (2), Review of Medication Regimen & Side Effects (2) and Review of New Medication or Change in Dosage (2)  I certify that inpatient services furnished can reasonably be expected to improve the patient's condition.   Thedore Mins, MD 10/01/2014, 11:23 AM

## 2014-10-01 NOTE — Progress Notes (Signed)
D Mea cont to have a very difficult time dealing with her labile feelings and modulating her behaviors. She is loud, inappropiate, agitated, intrusive and rude. She does not take direction well. She sees herself as a victim. She defines herself by what others say to her and about her. She has a poor self image. She has difficulty handling her intense feelings of hurt and rejection. SHe  Is emotionally stuck and wants to stay there....having   No insight and not knowing how to get her needs met in a healthy way.     A She has had several run-ins with other patients today..calling them  inapppropriate names, yelling at them, threatening to hit them and to hit  Staff " if I dont get some medicine..." and has kept the entire unit in a state of chaos today. She received ativan 1 mg po prn, with no relief / benefit noted.    R S is meedting with NP currentoy to address her medications. Safety in place.

## 2014-10-01 NOTE — H&P (Signed)
Psychiatric Admission Assessment Adult  Patient Identification: Betty Parrish MRN:  [id number] Date of Evaluation:  10/01/2014 Chief Complaint:  SCHIZOAFFECTIVE DISORDER Principal Diagnosis: Bipolar disorder, manic phase Diagnosis:   Patient Active Problem List   Diagnosis Date Noted  . Cannabis abuse [F12.10]   . Bipolar disorder, manic phase [F31.10] 09/30/2014  . Schizoaffective disorder, bipolar type [F25.0] 09/29/2014  . Schizoaffective disorder [F25.9] 12/18/2012  . Mania [F30.9] 12/18/2012   History of Present Illness:: Patient states that she was tricked by the people at [facility].  "They told me to sign the paper; and that I would be evaluated and not admitted.  I got pissed off; so I showed out.  I told them I was going to do it.  I just wanted to get my medicine straight and go.  Patient states that she gets irritated real easy and she needs to have her medication adjusted to help her calm down.  States that why she went to the doctor.   Patient was angry; irritable; and yelling about finding a medication to start on.  States "I don't want nothing that's going to make me be sitting up like no zombie; or making me gain a lot of weight." Patient would not answer no there questions during interview.    Elements:  Location:  Irritability. Quality:  Aggressive behavior. Severity:  Severe. Duration:  Several days. Associated Signs/Symptoms: Depression Symptoms:  depressed mood, difficulty concentrating, hopelessness, anxiety, panic attacks, (Hypo) Manic Symptoms:  Hallucinations, Impulsivity, Irritable Mood, Anxiety Symptoms:  Excessive Worry, Panic Symptoms, Psychotic Symptoms:  Hallucinations: Auditory PTSD Symptoms:2 Unable to get answers at this time Total Time spent with patient: 1 hour  Past Medical History:  Past Medical History  Diagnosis Date  . Anxiety   . Heart murmur   . Sickle cell trait   . Schizo-affective psychosis   . ADHD (attention deficit  hyperactivity disorder)    History reviewed. No pertinent past surgical history. Family History:  Family History  Problem Relation Age of Onset  . Sickle cell anemia Mother   Patient refused to answer any other questions related to family history Social History:  History  Alcohol Use  . Yes     History  Drug Use  . Yes  . Special: Marijuana, Cocaine    History   Social History  . Marital Status: Single    Spouse Name: N/A  . Number of Children: N/A  . Years of Education: N/A   Social History Main Topics  . Smoking status: Current Every Day Smoker -- 1.00 packs/day    Types: Cigarettes  . Smokeless tobacco: Not on file  . Alcohol Use: Yes  . Drug Use: Yes    Special: Marijuana, Cocaine  . Sexual Activity: Yes    Birth Control/ Protection: IUD   Other Topics Concern  . None   Social History Narrative   Additional Social History:    Pain Medications: n/a History of alcohol / drug use?: Yes Longest period of sobriety (when/how long): unknown Negative Consequences of Use: Financial Withdrawal Symptoms: Agitation, Anorexia   Musculoskeletal: Strength & Muscle Tone: within normal limits Gait & Station: normal Patient leans: N/A  Psychiatric Specialty Exam: Physical Exam  Nursing note and vitals reviewed. Constitutional: She is oriented to person, place, and time.  Neck: Normal range of motion.  Musculoskeletal: Normal range of motion.  Neurological: She is alert and oriented to person, place, and time.  Psychiatric: Her mood appears anxious. Her affect is angry and labile. Her  speech is rapid and/or pressured and tangential. She is aggressive, hyperactive and combative. Thought content is paranoid and delusional.    Review of Systems  Gastrointestinal: Positive for nausea.  Psychiatric/Behavioral: Positive for substance abuse. The patient is nervous/anxious.   All other systems reviewed and are negative.   Blood pressure 107/75, pulse 59, temperature 97.6  F (36.4 C), temperature source Oral, resp. rate 20, height 5' 7.5" (1.715 m), weight 94.348 kg (208 lb), last menstrual period 09/22/2014, SpO2 100 %.Body mass index is 32.08 kg/(m^2).  General Appearance: Casual  Eye Contact::  Good  Speech:  Pressured  Volume:  Increased  Mood:  Angry and Irritable  Affect:  Labile  Thought Process:  Circumstantial and Disorganized  Orientation:  Full (Time, Place, and Person)  Thought Content:  Delusions  Suicidal Thoughts:  No  Homicidal Thoughts:  No  Memory:  Immediate;   Fair Recent;   Fair Remote;   Good  Judgement:  Impaired  Insight:  Lacking  Psychomotor Activity:  Increased  Concentration:  Fair  Recall:  [employer] of Knowledge:Fair  Language: Fair  Akathisia:  No  Handed:  Right  AIMS (if indicated):     Assets:  Communication Skills Desire for Improvement  ADL's:  Intact  Cognition: WNL  Sleep:  Number of Hours: 5.5   Risk to Self: Is patient at risk for suicide?: No What has been your use of drugs/alcohol within the last 12 months?: Patient has been smoking weed and cigarettes everyday. Patient smokes an 1/8 weed a day.  Risk to Others:   Prior Inpatient Therapy:   Prior Outpatient Therapy:    Alcohol Screening: 1. How often do you have a drink containing alcohol?: Never 9. Have you or someone else been injured as a result of your drinking?: Yes, but not in the last year 10. Has a relative or friend or a doctor or another health worker been concerned about your drinking or suggested you cut down?: Yes, but not in the last year Alcohol Use Disorder Identification Test Final Score (AUDIT): 4 Brief Intervention: AUDIT score less than 7 or less-screening does not suggest unhealthy drinking-brief intervention not indicated  Allergies:   Allergies  Allergen Reactions  . [place] Dark Tanning With Sunscreen 2 Oil [Sunscreens]     Hives   . Red Dye     Listed on paperwork  . Zyprexa [Olanzapine] Other (See Comments)     Reaction is unknown   Lab Results: No results found for this or any previous visit (from the past 48 hour(s)). Current Medications: Current Facility-Administered Medications  Medication Dose Route Frequency Provider Last Rate Last Dose  . ARIPiprazole (ABILIFY) tablet 15 mg  15 mg Oral Daily Earney Navy, NP   15 mg at 10/01/14 0806  . cephALEXin (KEFLEX) capsule 500 mg  500 mg Oral BID Earney Navy, NP   500 mg at 10/01/14 1750  . clonazePAM (KLONOPIN) tablet 1 mg  1 mg Oral BID Earney Navy, NP   1 mg at 10/01/14 1500  . divalproex (DEPAKOTE ER) 24 hr tablet 500 mg  500 mg Oral BID PC Lynix Bonine   500 mg at 10/01/14 1800  . hydrOXYzine (ATARAX/VISTARIL) tablet 50 mg  50 mg Oral Q6H PRN Earney Navy, NP      . ibuprofen (ADVIL,MOTRIN) tablet 600 mg  600 mg Oral Q6H PRN Kristeen Mans, NP   600 mg at 10/01/14 2011  . LORazepam (ATIVAN) tablet  1 mg  1 mg Oral Q8H PRN Earney Navy, NP   1 mg at 10/01/14 2011  . menthol-cetylpyridinium (CEPACOL) lozenge 3 mg  1 lozenge Oral TID PRN Earney Navy, NP      . nicotine (NICODERM CQ - dosed in mg/24 hours) patch 21 mg  21 mg Transdermal Daily Earney Navy, NP   21 mg at 10/01/14 0806  . ondansetron (ZOFRAN-ODT) disintegrating tablet 4 mg  4 mg Oral Q8H PRN Shavona Gunderman      . traZODone (DESYREL) tablet 150 mg  150 mg Oral QHS Shuvon B Rankin, NP      . trihexyphenidyl (ARTANE) tablet 5 mg  5 mg Oral BID WC Earney Navy, NP   5 mg at 10/01/14 1751  . [START ON 10/02/2014] ziprasidone (GEODON) capsule 20 mg  20 mg Oral BID WC Shuvon B Rankin, NP       PTA Medications: Prescriptions prior to admission  Medication Sig Dispense Refill Last Dose  . ARIPiprazole (ABILIFY MAINTENA) 400 MG SUSR Inject 400 mg into the muscle every 30 (thirty) days.   Unknown at Unknown time  . ARIPiprazole (ABILIFY) 15 MG tablet Take 15 mg by mouth daily.   Unknown at Unknown time  . benztropine (COGENTIN) 0.5 MG tablet  Take 0.5 mg by mouth 2 (two) times daily.   Unknown at Unknown time  . cephALEXin (KEFLEX) 500 MG capsule Take 1 capsule (500 mg total) by mouth 4 (four) times daily. (Patient not taking: Reported on 09/29/2014) 20 capsule 0 Unknown at Unknown time  . levonorgestrel (MIRENA) 20 MCG/24HR IUD 1 each by Intrauterine route once.   Unknown at Unknown time  . Naproxen Sod-Diphenhydramine (ALEVE PM) 220-25 MG TABS Take 2 tablets by mouth at bedtime as needed (sleep).   Unknown at Unknown time  . traZODone (DESYREL) 100 MG tablet Take 100 mg by mouth at bedtime.   Unknown at Unknown time    Previous Psychotropic Medications: Yes   Substance Abuse History in the last 12 months:  Yes.      Consequences of Substance Abuse: Family Consequences:  Family discord  No results found for this or any previous visit (from the past 72 hour(s)).  Observation Level/Precautions:  15 minute checks  Laboratory:  CBC Chemistry Profile UDS  Psychotherapy:  Individual and group session  Medications:  Medications will be started as appropriate for patients stabilization  Consultations:  Psychiatry  Discharge Concerns:  Safety, stabilization, and risk of access to medication and medication stabilization   Estimated LOS:  5-7 days  Other:     Psychological Evaluations: Yes   Treatment Plan Summary: Daily contact with patient to assess and evaluate symptoms and progress in treatment and Medication management  1. Admit for crisis management and stabilization 2. Medication management to reduce current symptoms to bale line and improve the patient's overall level of functioning: Started Geodon 20 mg Bid with meals for mood stabilization; Trazodone 150  mg Q hs prn for sleep. 3. Treat health problems as indicated 4. Develop treatment plan to decrease risk of relapse upon discharge and the need for readmission. 5. Psycho-social education regarding relapse prevention and self care. 6. Health care follow up as needed  for medical problems 7. Restarted home medications where appropriate:  Continued Depakote ER 500 mg Bid;  Abilify 15 mg daily; and Artane 5 mg daily for EPS   Medical Decision Making:  Review of Psycho-Social Stressors (1), Review or order clinical lab tests (  1), Review and summation of old records (2), Review of Medication Regimen & Side Effects (2) and Review of New Medication or Change in Dosage (2)  I certify that inpatient services furnished can reasonably be expected to improve the patient's condition.   Assunta Found, FNP-BC 7/24/20169:17 PM Patient seen face-to-face for psychiatric evaluation, chart reviewed and case discussed with the physician extender and developed treatment plan. Reviewed the information documented and agree with the treatment plan. Thedore Mins, MD

## 2014-10-01 NOTE — Progress Notes (Signed)
D: Pt who is manic is also very loud, intrusive and demanding. Pt who also lacks insight continually yells vulgar languages at staffs. Pt at this time denies SI/HI; also denies anxiety and depression. Pt c/o of bilateral hand pain from where she had punched the wall at the last facility she was. Pt also demands she gets a roommate because she is worried that she could be attached by demons. Pt is also delusional-referential; she states, "I saw the staffs at [facility] texting on their phone; they were talking about me to the whole world" Pt "DO NOT ADMIT" order continues. A: Medications administered as prescribed.  Support, encouragement, and safe environment provided.  15-minute safety checks continue. R: Pt was med compliant.  Safety checks continue
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
## 2014-10-01 NOTE — [facility] Group Notes (Signed)
[facility] Group Notes:  (Clinical Social Work)  10/01/2014  [facility] Group Notes:  (Clinical Social Work)  10/01/2014  11:00AM-12:00PM  Summary of Progress/Problems:  The main focus of today's process group was to listen to a variety of genres of music and to identify that different types of music provoke different responses.  The patient then was able to identify personally what was soothing for them, as well as energizing.  The patient was irritable and demanding throughout group, intruding into a female patient's space and insisting on talking constantly even when CSW and pt himself asked her to stop.  She moved to multiple seats, left the room over and over but would return.    Type of Therapy:  Music Therapy   Participation Level:  Active  Participation Quality:  Attentive and Intrusive and Resistant  Affect:  Blunted and Irritable  Cognitive:  Disorganized  Insight:  None  Engagement in Therapy:  Limited  Modes of Intervention:   Activity, Exploration  Ambrose Mantle, LCSW 10/01/2014

## 2014-10-02 DIAGNOSIS — F3110 Bipolar disorder, current episode manic without psychotic features, unspecified: Secondary | ICD-10-CM

## 2014-10-02 MED ORDER — ARIPIPRAZOLE 15 MG PO TABS
15.0000 mg | ORAL_TABLET | Freq: Every day | ORAL | Status: DC
  Administered 2014-10-03 – 2014-10-04 (×2): 15 mg via ORAL
  Filled 2014-10-02: qty 1
  Filled 2014-10-02: qty 14
  Filled 2014-10-02 (×2): qty 1

## 2014-10-02 MED ORDER — ZIPRASIDONE HCL 40 MG PO CAPS: 40.0000 mg | ORAL_CAPSULE | Freq: Two times a day (BID) | ORAL | Status: DC

## 2014-10-02 MED ORDER — LIP MEDEX EX OINT
TOPICAL_OINTMENT | CUTANEOUS | Status: DC | PRN
  Administered 2014-10-02: 22:00:00 via TOPICAL
  Filled 2014-10-02 (×2): qty 7

## 2014-10-02 MED ORDER — TRIHEXYPHENIDYL HCL 2 MG PO TABS
2.0000 mg | ORAL_TABLET | Freq: Two times a day (BID) | ORAL | Status: DC
  Administered 2014-10-03: 2 mg via ORAL
  Filled 2014-10-02 (×5): qty 1

## 2014-10-02 NOTE — Progress Notes (Signed)
[facility] Group Notes:  (Nursing/MHT/Case Management/Adjunct)  Date:  10/02/2014  Time:  11:54 PM  Type of Therapy:  Psychoeducational Skills  Participation Level:  Minimal  Participation Quality:  Attentive and Redirectable  Affect:  Flat  Cognitive:  Appropriate  Insight:  Appropriate  Engagement in Group:  Distracting  Modes of Intervention:  Education  Summary of Progress/Problems: The patient described her day as having been "irritating". She indicated that she spoke with her peers as a means of coping. As a theme for the day, her wellness technique will be to sing rap music or hip hop.   Hazle Coca S 10/02/2014, 11:54 PM
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
## 2014-10-02 NOTE — [facility] Group Notes (Signed)
[facility] LCSW Aftercare Discharge Planning Group Note   10/02/2014 4:03 PM  Participation Quality:  Disruptive  Mood/Affect:  Angry  Depression Rating:  0  Anxiety Rating:  10  Thoughts of Suicide:  No Will you contract for safety?   NA  Current AVH:  No  Plan for Discharge/Comments:  Pt presents as loud, irritable and angry.  Using a lot of curse words, but apologizes when called on it.  States she is working with [facility].  Insists she is taking meds as prescribed, "but I need something to level me out." C/O mind racing.  Is working on [employer] through [employer]  Worried about her daughter who lives with relatives.  Transportation Means:   Supports:  Daryel Gerald B

## 2014-10-02 NOTE — [facility] Group Notes (Signed)
[facility] LCSW Group Therapy  10/02/2014 1:15 pm  Type of Therapy: Process Group Therapy  Participation Level:  Active  Participation Quality:  Appropriate  Affect:  Flat  Cognitive:  Oriented  Insight:  Improving  Engagement in Group:  Limited  Engagement in Therapy:  Limited  Modes of Intervention:  Activity, Clarification, Education, Problem-solving and Support  Summary of Progress/Problems: Today's group addressed the issue of overcoming obstacles.  Patients were asked to identify their biggest obstacle post d/c that stands in the way of their on-going success, and then problem solve as to how to manage this.  Talked about numerous obstacles, including appealing SSDI denial, inability to work [though she asks for a letter for her [employer] employer], no income and having to stay in a crowed relative's house with limited furniture.  However, presents as much different than this AM.  States she is much calmer due to being started on Depakote and getting a visit from mother and [place], [place] team worker.  Furthermore, states she has confidence that all things will work out, she needs to learn to practice more patience.   Daryel Gerald B 10/02/2014   4:11 PM

## 2014-10-02 NOTE — Progress Notes (Signed)
D: Patient is alert and oriented. Pt's mood and affect is anxious, angry, and irritable. Pt denies SI/HI and AVH. Pt's speech is argumentative, pressured, loud, and aggressive at times. Pt rates depression 0/10, hopelessness 5/10, and anxiety 10/10. Pt reports her goal for the day is to work on "my anger." Pt is observed shouting and cussing at another pt this morning, receptive to redirection with RN. Pt C/O nausea this morning with relief from PRN medication. Pt C/O bilateral hip pain with radiation to lower back, 8/10, pt reports no relief from PRN medication, pt appears to be tolerating pain level well. Pt is attending unit groups today. A: Active listening by RN. Encouragement/Support provided to pt. Genevie Cheshire, with ACT team visiting with pt at 1100 today with pt consent. Pt's mother visiting with pt per pt consent and MD Cobos' approval at 1130. Pt encouraged to rest/relax in attempt to decrease on-going pain. Medication education reviewed with pt. PRN medication administered for nausea and pain per providers orders (See MAR). Scheduled medications administered per providers orders (See MAR). 15 minute checks continued per protocol for patient safety.  R: Patient needs frequent redirection. Pt remains safe.

## 2014-10-02 NOTE — Tx Team (Signed)
Interdisciplinary Treatment Plan Update (Adult)  Date:  10/02/2014   Time Reviewed:  8:42 AM   Progress in Treatment: Attending groups: Yes. Participating in groups:  Yes. Taking medication as prescribed:  Yes. Tolerating medication:  Yes. Family/Significant othe contact made:  Yes Patient understands diagnosis:  Yes  As evidenced by seeking help with anger issues Discussing patient identified problems/goals with staff:  Yes, see initial care plan. Medical problems stabilized or resolved:  Yes. Denies suicidal/homicidal ideation: Yes. Issues/concerns per patient self-inventory:  No. Other:  New problem(s) identified:  Discharge Plan or Barriers:  Return home, follow up with ACT team  Reason for Continuation of Hospitalization: Medication stabilization Other; describe Anger, mood instability  Comments:   Patient is somewhat loud. She does talk about fears that someone is going to kill her. She does not want to divulge why. She also says that she is afraid that her daughter is in danger. Daughter is 69 years old and staying with her paternal grandmother. Patient is homeless. She says she has been that way for the last two years when she lost her SSI. Patient admits to using cocaine for the first time a couple of days ago. She smokes marijuana multiple times in the week.  Patient denies any current SI or HI and A/V hallucinations. She does appear to be very paranoid however and this is clouding her judgement. Patient is not taking medications as prescribed. She does say that she has ACTT services from [facility].  Estimated length of stay: 3-4 days  New goal(s):  Review of initial/current patient goals per problem list:     Attendees: Patient:  10/02/2014 8:42 AM   Family:   10/02/2014 8:42 AM   Physician:  Nehemiah Massed, MD 10/02/2014 8:42 AM   Nursing:   Lendell Caprice, RN 10/02/2014 8:42 AM   CSW:    Daryel Gerald, LCSW   10/02/2014 8:42 AM   Other:   10/02/2014 8:42 AM   Other:   10/02/2014 8:42 AM   Other:  Onnie Boer, Nurse CM 10/02/2014 8:42 AM   Other:  Leisa Lenz, [facility] 10/02/2014 8:42 AM   Other:  Tomasita Morrow, P4CC  10/02/2014 8:42 AM   Other:  10/02/2014 8:42 AM   Other:  10/02/2014 8:42 AM   Other:  10/02/2014 8:42 AM   Other:  10/02/2014 8:42 AM   Other:  10/02/2014 8:42 AM   Other:   10/02/2014 8:42 AM    Scribe for Treatment Team:   Ida Rogue, 10/02/2014 8:42 AM

## 2014-10-02 NOTE — Progress Notes (Signed)
D: Pt who is manic is also very loud, intrusive and demanding. Pt lacks insight, yells and often throw temper tantrums (childlike). Pt at this time denies SI/HI; also denies anxiety and depression. Pt c/o of bilateral hand pain. Pt continue to be very paranoid and delusional-a smile from one staff member to another means some form of signal about her. Pt needs continuous redirection. A: Medications administered as prescribed.  Support, encouragement, and safe environment provided.  15-minute safety checks continue. R: Pt was med compliant.  Safety checks continue

## 2014-10-03 MED ORDER — TRIHEXYPHENIDYL HCL 2 MG PO TABS
2.0000 mg | ORAL_TABLET | Freq: Every day | ORAL | Status: DC
  Filled 2014-10-03: qty 14
  Filled 2014-10-03: qty 1

## 2014-10-03 NOTE — Progress Notes (Signed)
D) Pt has been labile in mood. Affect sullen or incongruant. Pt is negative for groups and activities despite prompting. Pt also avoiding cafeteria. Areeba has been enmeshed with female peer and displays poor boundaries. Pt frequently c/o pain in her "back". After telephone conversation with her "ex', but became very loud, agitated, cursing, and tearful. Pt has been focused on d/c. Compliant with medications. A) Level 3 obs for safety, support and encouragement provided. Med ed reinforced. Redirection and prompting as needed. R) Labile.

## 2014-10-03 NOTE — Progress Notes (Signed)
Adult Psychoeducational Group Note  Date:  10/03/2014 Time:  11:56 PM  Group Topic/Focus:  Wrap-Up Group:   The focus of this group is to help patients review their daily goal of treatment and discuss progress on daily workbooks.  Participation Level:  Active  Participation Quality:  Redirectable  Affect:  Irritable  Cognitive:  Alert  Insight: Good  Engagement in Group:  Distracting and Engaged  Modes of Intervention:  Discussion and Education  Additional Comments:  Pt rated her day at an 8 out of 10. Pt shared that she was having a good day until her nurses "pisses her off" by ignoring her when she asked questions. Pt stated she is ready to go home.  Malachy Moan 10/03/2014, 11:56 PM
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
## 2014-10-03 NOTE — Progress Notes (Signed)
D Pt. Denies SI and HI, no complaints of pain or discomfort noted at present time.  A Writer offered support and encouragement.  R Pt. Reports she is much better than when admitted. Writer ask pt. If she felt her medications were working and pt. States "Yeah, you should have seen me when I got here". Pt. Then blamed her issues on  Act team stating they should have seen her instead of sending her to [place]. Pt. Is obsessed with a female peer.  When writer was medicating the peer the pt. Made a comment about being careful because she was a very jealous person.  The two had to be separated multiple times this pm. Pt. Remains safe on the unit.

## 2014-10-03 NOTE — [facility] Group Notes (Signed)
[facility] LCSW Group Therapy  10/03/2014 , 12:16 PM   Type of Therapy:  Group Therapy  Participation Level: Invited  Chose to not attend  Summary of Progress/Problems: Today's group focused on the term Diagnosis.  Participants were asked to define the term, and then pronounce whether it is a negative, positive or neutral term.    Betty Parrish B 10/03/2014 , 12:16 PM

## 2014-10-03 NOTE — Progress Notes (Signed)
D: Patient denies SI/HI/AVH. Patient did complain of back pain 5 out of 10. Patient asked for medication for pain, at 2020 but this writer was not able to give at time because of time she had previous at 1516. Patient was given a heat pack. Patient stated that made her back feel better "it a 3/10 now". Patient did attend group this evening, but after sat at end of [place] with other patient. Pt was redirected to put some space between her and other patient. Patient complied.   A: Staff to monitor Q 15 mins for safety. Encouragement and support offered. Scheduled medications administered per orders. R: Patient remains safe on the unit. Patient attended group tonight. Patient visible on hte unit and interacting with peers. Patient taking administered medications.

## 2014-10-04 MED ORDER — NICOTINE 21 MG/24HR TD PT24: 21.0000 mg | MEDICATED_PATCH | Freq: Every day | TRANSDERMAL | Status: DC

## 2014-10-04 MED ORDER — ARIPIPRAZOLE 15 MG PO TABS: 15.0000 mg | ORAL_TABLET | Freq: Every day | ORAL | Status: DC

## 2014-10-04 MED ORDER — HYDROXYZINE HCL 50 MG PO TABS: 50.0000 mg | ORAL_TABLET | Freq: Four times a day (QID) | ORAL | Status: DC | PRN

## 2014-10-04 MED ORDER — TRAZODONE HCL 150 MG PO TABS: 150.0000 mg | ORAL_TABLET | Freq: Every day | ORAL | Status: DC

## 2014-10-04 MED ORDER — MENTHOL 3 MG MT LOZG: 1.0000 | LOZENGE | Freq: Three times a day (TID) | OROMUCOSAL | Status: DC | PRN

## 2014-10-04 MED ORDER — DIVALPROEX SODIUM ER 500 MG PO TB24: 500.0000 mg | ORAL_TABLET | Freq: Two times a day (BID) | ORAL | Status: DC

## 2014-10-04 MED ORDER — TRIHEXYPHENIDYL HCL 2 MG PO TABS: 2.0000 mg | ORAL_TABLET | Freq: Every day | ORAL | Status: DC

## 2014-10-04 MED ORDER — CEPHALEXIN 500 MG PO CAPS: 500.0000 mg | ORAL_CAPSULE | Freq: Two times a day (BID) | ORAL | Status: DC

## 2014-10-04 MED ORDER — CLONAZEPAM 1 MG PO TABS: 1.0000 mg | ORAL_TABLET | Freq: Two times a day (BID) | ORAL | Status: DC

## 2014-10-04 NOTE — [facility] Suicide Risk Assessment (Signed)
[facility] INPATIENT:  Family/Significant Other Suicide Prevention Education  Suicide Prevention Education:  Education Completed; No one has been identified by the patient as the family member/significant other with whom the patient will be residing, and identified as the person(s) who will aid the patient in the event of a mental health crisis (suicidal ideations/suicide attempt).  With written consent from the patient, the family member/significant other has been provided the following suicide prevention education, prior to the and/or following the discharge of the patient.  The suicide prevention education provided includes the following:  Suicide risk factors  Suicide prevention and interventions  National Suicide Hotline telephone number  [facility] assessment telephone number  [facility] Emergency Assistance 911  [facility] and/or Residential Mobile Crisis Unit telephone number  Request made of family/significant other to:  Remove weapons (e.g., guns, rifles, knives), all items previously/currently identified as safety concern.    Remove drugs/medications (over-the-counter, prescriptions, illicit drugs), all items previously/currently identified as a safety concern.  The family member/significant other verbalizes understanding of the suicide prevention education information provided.  The family member/significant other agrees to remove the items of safety concern listed above. The patient did not endorse SI at the time of admission, nor did the patient c/o SI during the stay here.  SPE not required.   Daryel Gerald B 10/04/2014, 1:22 PM

## 2014-10-04 NOTE — [facility] Suicide Risk Assessment (Signed)
[facility] Discharge Suicide Risk Assessment   Demographic Factors:  24 year old single  female, living with family.  Total Time spent with patient: NA   Musculoskeletal: Strength & Muscle Tone: within normal limits Gait & Station: normal Patient leans: N/A   Patient seen by me  yesterday- see progress note. Not seen today by me,    but I have reviewed status at discharge with Nursing Staff, CSW, and  Patient's ACT Team.   Psychiatric Specialty Exam: Physical Exam  ROS  Blood pressure 119/97, pulse 75, temperature 98.5 F (36.9 C), temperature source Oral, resp. rate 16, height 5' 7.5" (1.715 m), weight 208 lb (94.348 kg), last menstrual period 09/22/2014, SpO2 100 %.Body mass index is 32.08 kg/(m^2).  As discussed with  Staff - see above- patient continued to improve - better able to participate in groups, milieu, less irritable, less dysphoric, denying any SI or HI, and not exhibiting any self injurious behaviors . She denied hallucinations. She left unit in good spirits , picked up by her ACT team. As discussed with ACT team staff, she reported improvement and behavior calm- she was transported to her mother's home, where she plans to live . As noted, when I saw patient yesterday she was improving, less irritable, and with a more reactive affect- no SI or HI at the time.                                                       Have you used any form of tobacco in the last 30 days? (Cigarettes, Smokeless Tobacco, Cigars, and/or Pipes): Yes  Has this patient used any form of tobacco in the last 30 days? (Cigarettes, Smokeless Tobacco, Cigars, and/or Pipes) N/A  Mental Status Per Nursing Assessment::   On Admission:  Suicidal ideation indicated by patient, NA  Current Mental Status by Physician: As discussed with staff  Patient improved compared to admission- no SI , no HI, no psychotic symptoms, behavior in good control.   Loss Factors: Chronic mental illness,  disabiity  Historical Factors: History of Schizoaffective Disorder, history of Cannabis Dependence   Risk Reduction Factors:   Family - mother- and ACT support   Continued Clinical Symptoms:   s above, presented with improvement compared to admission  Cognitive Features That Contribute To Risk:  Limited insight into illness   Suicide Risk:  Mild:  Suicidal ideation of limited frequency, intensity, duration, and specificity.  There are no identifiable plans, no associated intent, mild dysphoria and related symptoms, good self-control (both objective and subjective assessment), few other risk factors, and identifiable protective factors, including available and accessible social support.  Principal Problem: Bipolar disorder, manic phase Discharge Diagnoses:  Patient Active Problem List   Diagnosis Date Noted  . Cannabis abuse [F12.10]   . Bipolar disorder, manic phase [F31.10] 09/30/2014  . Schizoaffective disorder, bipolar type [F25.0] 09/29/2014  . Schizoaffective disorder [F25.9] 12/18/2012  . Mania [F30.9] 12/18/2012    Follow-up Information    Follow up with [facility].   Why:  Someone will see you the day after discharge from the hospital.  Call them to find out what time to expect them   Contact information:   [address]  [place]  [phone]] [phone]      Plan Of Care/Follow-up recommendations:  Activity:  as  tolerated  Diet:  Regular Tests:  NA Other:  See below  Is patient on multiple antipsychotic therapies at discharge:  No   Has Patient had three or more failed trials of antipsychotic monotherapy by history:  No  Recommended Plan for Multiple Antipsychotic Therapies: NA   Patient was discharged to her ACT Team.  Plans to live with mother .     Adora Yeh, Madaline Guthrie 10/04/2014, 3:43 PM

## 2014-10-04 NOTE — Progress Notes (Signed)
Pt d/c to care of her ACT team worker. D/c instructions, rx's, and medication samples reviewed and given. suicide prevention information reviewed and given. Pt and worker verbalizes understanding. Pt denies s.i.

## 2014-10-04 NOTE — Progress Notes (Signed)
  [facility] Adult Case Management Discharge Plan :  Will you be returning to the same living situation after discharge:  Yes,  home At discharge, do you have transportation home?: Yes,  friend Do you have the ability to pay for your medications: Yes,  MCD  Release of information consent forms completed and in the chart;  Patient's signature needed at discharge.  Patient to Follow up at: Follow-up Information    Follow up with [facility].   Why:  Someone will see you the day after discharge from the hospital.  Call them to find out what time to expect them   Contact information:   [address]  [place]  [336] [phone]      Patient denies SI/HI: Yes,  yes    Safety Planning and Suicide Prevention discussed: Yes,  yes  Have you used any form of tobacco in the last 30 days? (Cigarettes, Smokeless Tobacco, Cigars, and/or Pipes): Yes  Has patient been referred to the Quitline?: Patient refused referral  Ida Rogue 10/04/2014, 12:38 PM

## 2014-10-04 NOTE — Discharge Summary (Signed)
Physician Discharge Summary Note  Patient:  Betty Parrish is an 24 y.o., female MRN:  [id number] DOB:  Jan 30, 1991 Patient phone:  [phone] (home)  Patient address:   [address] [place] [address],  Total Time spent with patient: 45 minutes  Date of Admission:  09/30/2014 Date of Discharge: 10/04/2014  Reason for Admission:   Patient states that she was tricked by the people at [facility]. "They told me to sign the paper; and that I would be evaluated and not admitted. I got pissed off; so I showed out. I told them I was going to do it. I just wanted to get my medicine straight and go. Patient states that she gets irritated real easy and she needs to have her medication adjusted to help her calm down. States that why she went to the doctor. Patient was angry; irritable; and yelling about finding a medication to start on. States "I don't want nothing that's going to make me be sitting up like no zombie; or making me gain a lot of weight." Patient would not answer no there questions during interview.   Principal Problem: Bipolar disorder, manic phase Discharge Diagnoses: Patient Active Problem List   Diagnosis Date Noted  . Cannabis abuse [F12.10]   . Bipolar disorder, manic phase [F31.10] 09/30/2014  . Schizoaffective disorder, bipolar type [F25.0] 09/29/2014  . Schizoaffective disorder [F25.9] 12/18/2012  . Mania [F30.9] 12/18/2012    Musculoskeletal: Strength & Muscle Tone: within normal limits Gait & Station: normal Patient leans: N/A  Psychiatric Specialty Exam: Physical Exam  Review of Systems  Psychiatric/Behavioral: Positive for depression and substance abuse. Negative for suicidal ideas. The patient is nervous/anxious.   All other systems reviewed and are negative.   Blood pressure 119/97, pulse 75, temperature 98.5 F (36.9 C), temperature source Oral, resp. rate 16, height 5' 7.5" (1.715 m), weight 94.348 kg (208 lb), last menstrual period 09/22/2014, SpO2  100 %.Body mass index is 32.08 kg/(m^2).    SEE MD PSE within the [facility]  Have you used any form of tobacco in the last 30 days? (Cigarettes, Smokeless Tobacco, Cigars, and/or Pipes): Yes  Has this patient used any form of tobacco in the last 30 days? (Cigarettes, Smokeless Tobacco, Cigars, and/or Pipes) Yes, A prescription for an FDA-approved tobacco cessation medication was offered at discharge and the patient accepted  Past Medical History:  Past Medical History  Diagnosis Date  . Anxiety   . Heart murmur   . Sickle cell trait   . Schizo-affective psychosis   . ADHD (attention deficit hyperactivity disorder)    History reviewed. No pertinent past surgical history. Family History:  Family History  Problem Relation Age of Onset  . Sickle cell anemia Mother    Social History:  History  Alcohol Use  . Yes     History  Drug Use  . Yes  . Special: Marijuana, Cocaine    History   Social History  . Marital Status: Single    Spouse Name: N/A  . Number of Children: N/A  . Years of Education: N/A   Social History Main Topics  . Smoking status: Current Every Day Smoker -- 1.00 packs/day    Types: Cigarettes  . Smokeless tobacco: Not on file  . Alcohol Use: Yes  . Drug Use: Yes    Special: Marijuana, Cocaine  . Sexual Activity: Yes    Birth Control/ Protection: IUD   Other Topics Concern  . None   Social History Narrative  Risk to Self: Is patient at risk for suicide?: No What has been your use of drugs/alcohol within the last 12 months?: Patient has been smoking weed and cigarettes everyday. Patient smokes an 1/8 weed a day.  Risk to Others:   Prior Inpatient Therapy:   Prior Outpatient Therapy:    Level of Care:  OP  Hospital Course:   Betty Parrish was admitted for Bipolar disorder, manic phase , with psychosis and crisis management.  Pt was treated discharged with the medications listed below under Medication List.  Medical problems were identified and treated  as needed.  Home medications were restarted as appropriate.  Improvement was monitored by observation and Betty Parrish 's daily report of symptom reduction.  Emotional and mental status was monitored by daily self-inventory reports completed by Betty Parrish and clinical staff.         Betty Parrish was evaluated by the treatment team for stability and plans for continued recovery upon discharge. Betty Parrish 's motivation was an integral factor for scheduling further treatment. Employment, transportation, bed availability, health status, family support, and any pending legal issues were also considered during hospital stay. Pt was offered further treatment options upon discharge including but not limited to Residential, Intensive Outpatient, and Outpatient treatment.  Betty Parrish will follow up with the services as listed below under Follow Up Information.     Upon completion of this admission the patient was both mentally and medically stable for discharge denying suicidal/homicidal ideation, auditory/visual/tactile hallucinations, delusional thoughts and paranoia.    Consults:  None  Significant Diagnostic Studies:  UDS + for THC, UA indicative of probable UTI, treated with Keflex  Discharge Vitals:   Blood pressure 119/97, pulse 75, temperature 98.5 F (36.9 C), temperature source Oral, resp. rate 16, height 5' 7.5" (1.715 m), weight 94.348 kg (208 lb), last menstrual period 09/22/2014, SpO2 100 %. Body mass index is 32.08 kg/(m^2). Lab Results:   No results found for this or any previous visit (from the past 72 hour(s)).  Physical Findings: AIMS: Facial and Oral Movements Muscles of Facial Expression: None, normal Lips and Perioral Area: None, normal Jaw: None, normal Tongue: None, normal,Extremity Movements Upper (arms, wrists, hands, fingers): None, normal Lower (legs, knees, ankles, toes): None, normal, Trunk Movements Neck, shoulders, hips: None, normal, Overall Severity Severity of  abnormal movements (highest score from questions above): None, normal Incapacitation due to abnormal movements: None, normal Patient's awareness of abnormal movements (rate only patient's report): No Awareness, Dental Status Current problems with teeth and/or dentures?: No Does patient usually wear dentures?: No  CIWA:  CIWA-Ar Total: 0 COWS:  COWS Total Score: 0   See Psychiatric Specialty Exam and Suicide Risk Assessment completed by Attending Physician prior to discharge.  Discharge destination:  Home  Is patient on multiple antipsychotic therapies at discharge:  No   Has Patient had three or more failed trials of antipsychotic monotherapy by history:  No    Recommended Plan for Multiple Antipsychotic Therapies: NA     Medication List    STOP taking these medications        ALEVE PM 220-25 MG Tabs  Generic drug:  Naproxen Sod-Diphenhydramine     benztropine 0.5 MG tablet  Commonly known as:  COGENTIN     levonorgestrel 20 MCG/24HR IUD  Commonly known as:  MIRENA      TAKE these medications      Indication   ARIPiprazole 15 MG tablet  Commonly known as:  ABILIFY  Take  1 tablet (15 mg total) by mouth daily.   Indication:  mood stabilization     cephALEXin 500 MG capsule  Commonly known as:  KEFLEX  Take 1 capsule (500 mg total) by mouth 2 (two) times daily.   Indication:  acute infection     clonazePAM 1 MG tablet  Commonly known as:  KLONOPIN  Take 1 tablet (1 mg total) by mouth 2 (two) times daily.   Indication:  severe anxiety     divalproex 500 MG 24 hr tablet  Commonly known as:  DEPAKOTE ER  Take 1 tablet (500 mg total) by mouth 2 (two) times daily after a meal.   Indication:  mood stabilization     hydrOXYzine 50 MG tablet  Commonly known as:  ATARAX/VISTARIL  Take 1 tablet (50 mg total) by mouth every 6 (six) hours as needed (mild anxiety).   Indication:  Anxiety Neurosis     menthol-cetylpyridinium 3 MG lozenge  Commonly known as:  CEPACOL   Take 1 lozenge (3 mg total) by mouth 3 (three) times daily as needed for sore throat.   Indication:  sore throat     nicotine 21 mg/24hr patch  Commonly known as:  NICODERM CQ - dosed in mg/24 hours  Place 1 patch (21 mg total) onto the skin daily.   Indication:  Nicotine Addiction     traZODone 150 MG tablet  Commonly known as:  DESYREL  Take 1 tablet (150 mg total) by mouth at bedtime.   Indication:  Trouble Sleeping     trihexyphenidyl 2 MG tablet  Commonly known as:  ARTANE  Take 1 tablet (2 mg total) by mouth at bedtime.   Indication:  Extrapyramidal Reaction caused by Medications         Follow-up recommendations:  Activity:  As tolerated Diet:  heart healthy with low sodium.  Comments:   Take all medications as prescribed. Keep all follow-up appointments as scheduled.  Do not consume alcohol or use illegal drugs while on prescription medications. Report any adverse effects from your medications to your primary care provider promptly.  In the event of recurrent symptoms or worsening symptoms, call 911, a crisis hotline, or go to the nearest emergency department for evaluation.   Total Discharge Time: Greater than 30 minutes  Signed: Beau Fanny, FNP-BC 10/04/2014, 12:35 PM   Patient seen, Suicide Assessment Completed.  Disposition Plan Reviewed

## 2015-04-20 ENCOUNTER — Emergency Department (HOSPITAL_COMMUNITY)
Admission: EM | Admit: 2015-04-20 | Discharge: 2015-04-20 | Disposition: A | Discharge status: Home / Self Care | Payer: Medicaid Other | Attending: Emergency Medicine | Admitting: Emergency Medicine

## 2015-04-20 ENCOUNTER — Encounter (HOSPITAL_COMMUNITY): Payer: Self-pay | Admitting: Emergency Medicine

## 2015-04-20 DIAGNOSIS — Z79899 Other long term (current) drug therapy: Secondary | ICD-10-CM | POA: Diagnosis not present

## 2015-04-20 DIAGNOSIS — Z792 Long term (current) use of antibiotics: Secondary | ICD-10-CM | POA: Diagnosis not present

## 2015-04-20 DIAGNOSIS — R52 Pain, unspecified: Secondary | ICD-10-CM | POA: Diagnosis present

## 2015-04-20 DIAGNOSIS — F17210 Nicotine dependence, cigarettes, uncomplicated: Secondary | ICD-10-CM | POA: Diagnosis not present

## 2015-04-20 DIAGNOSIS — Z862 Personal history of diseases of the blood and blood-forming organs and certain disorders involving the immune mechanism: Secondary | ICD-10-CM | POA: Diagnosis not present

## 2015-04-20 DIAGNOSIS — R011 Cardiac murmur, unspecified: Secondary | ICD-10-CM | POA: Diagnosis not present

## 2015-04-20 DIAGNOSIS — R05 Cough: Secondary | ICD-10-CM

## 2015-04-20 DIAGNOSIS — F419 Anxiety disorder, unspecified: Secondary | ICD-10-CM | POA: Insufficient documentation

## 2015-04-20 DIAGNOSIS — J029 Acute pharyngitis, unspecified: Secondary | ICD-10-CM | POA: Diagnosis not present

## 2015-04-20 DIAGNOSIS — F909 Attention-deficit hyperactivity disorder, unspecified type: Secondary | ICD-10-CM | POA: Diagnosis not present

## 2015-04-20 DIAGNOSIS — R059 Cough, unspecified: Secondary | ICD-10-CM

## 2015-04-20 LAB — COMPREHENSIVE METABOLIC PANEL
ALK PHOS: 65 U/L (ref 38–126)
ALT: 26 U/L (ref 14–54)
AST: 22 U/L (ref 15–41)
Albumin: 4.3 g/dL (ref 3.5–5.0)
Anion gap: 10 (ref 5–15)
BUN: 8 mg/dL (ref 6–20)
CHLORIDE: 103 mmol/L (ref 101–111)
CO2: 27 mmol/L (ref 22–32)
CREATININE: 0.68 mg/dL (ref 0.44–1.00)
Calcium: 10 mg/dL (ref 8.9–10.3)
GFR calc Af Amer: >60 mL/min (ref >60)
Glucose, Bld: 92 mg/dL (ref 65–99)
Potassium: 4 mmol/L (ref 3.5–5.1)
Sodium: 140 mmol/L (ref 135–145)
Total Bilirubin: 1.1 mg/dL (ref 0.3–1.2)
Total Protein: 8.1 g/dL (ref 6.5–8.1)

## 2015-04-20 LAB — CBC
HCT: 42.7 % (ref 36.0–46.0)
Hemoglobin: 15 g/dL (ref 12.0–15.0)
MCH: 31.8 pg (ref 26.0–34.0)
MCHC: 35.1 g/dL (ref 30.0–36.0)
MCV: 90.5 fL (ref 78.0–100.0)
Platelets: 194 10*3/uL (ref 150–400)
RBC: 4.72 MIL/uL (ref 3.87–5.11)
RDW: 12.8 % (ref 11.5–15.5)
WBC: 6.6 10*3/uL (ref 4.0–10.5)

## 2015-04-20 MED ORDER — IBUPROFEN 800 MG PO TABS: 800.0000 mg | ORAL_TABLET | Freq: Three times a day (TID) | ORAL | Status: DC

## 2015-04-20 MED ORDER — ONDANSETRON 4 MG PO TBDP: 4.0000 mg | ORAL_TABLET | Freq: Three times a day (TID) | ORAL | Status: DC | PRN

## 2015-04-20 MED ORDER — BENZONATATE 100 MG PO CAPS: 100.0000 mg | ORAL_CAPSULE | Freq: Three times a day (TID) | ORAL | Status: DC

## 2015-04-20 MED ORDER — IBUPROFEN 400 MG PO TABS
800.0000 mg | ORAL_TABLET | Freq: Once | ORAL | Status: AC
  Administered 2015-04-20: 800 mg via ORAL
  Filled 2015-04-20: qty 2

## 2015-04-20 MED ORDER — BENZONATATE 100 MG PO CAPS
100.0000 mg | ORAL_CAPSULE | Freq: Once | ORAL | Status: AC
  Administered 2015-04-20: 100 mg via ORAL
  Filled 2015-04-20: qty 1

## 2015-04-20 NOTE — ED Notes (Signed)
Called pt multiple times for triage with no answer  

## 2015-04-20 NOTE — ED Notes (Signed)
Pt reports generalized body aches x 3 days. Pt reports trying to take tylenol for pain with no relief. Pt also reports nausea. Pt alert x4. NAD at this time.

## 2015-04-20 NOTE — ED Provider Notes (Signed)
CSN: [id number]     Arrival date & time 04/20/15  1214 History  By signing my name below, I, Evon Slack, attest that this documentation has been prepared under the direction and in the presence of Shawn Joy, PA-C. Electronically Signed: Evon Slack, ED Scribe. 04/20/2015. 2:00 PM.    Chief Complaint  Patient presents with  . Generalized Body Aches    The history is provided by the patient. No language interpreter was used.   HPI Comments: Betty Parrish is a 25 y.o. female who presents to the Emergency Department complaining of generalized myalgias onset 3 days prior. Pt rates the severity of her pain 10/10. Pt reports associated sore throat, subjective fever, chills, and a dry cough. She rates the severity of her sore throat 7/10 that's worse when coughing. States that she has tried tylenol and hot tea with no relief. Denies N/V/C/D, chest pain, shortness of breath, abdominal pain, or any other complaints.   Past Medical History  Diagnosis Date  . Anxiety   . Heart murmur   . Sickle cell trait (HCC)   . Schizo-affective psychosis (HCC)   . ADHD (attention deficit hyperactivity disorder)    History reviewed. No pertinent past surgical history. Family History  Problem Relation Age of Onset  . Sickle cell anemia Mother    Social History  Substance Use Topics  . Smoking status: Current Every Day Smoker -- 1.00 packs/day    Types: Cigarettes  . Smokeless tobacco: None  . Alcohol Use: Yes   OB History    No data available      Review of Systems  Constitutional: Positive for fever and chills.  HENT: Positive for sore throat.   Respiratory: Positive for cough.   Gastrointestinal: Negative for vomiting, abdominal pain and diarrhea.  Musculoskeletal: Positive for myalgias.  All other systems reviewed and are negative.     Allergies  Hawaiian tropic dark tanning with sunscreen 2 oil; Red dye; and Zyprexa  Home Medications   Prior to Admission medications    Medication Sig Start Date End Date Taking? Authorizing Provider  ARIPiprazole (ABILIFY) 15 MG tablet Take 1 tablet (15 mg total) by mouth daily. 10/04/14   Beau Fanny, FNP  benzonatate (TESSALON) 100 MG capsule Take 1 capsule (100 mg total) by mouth every 8 (eight) hours. 04/20/15   Shawn C Joy, PA-C  cephALEXin (KEFLEX) 500 MG capsule Take 1 capsule (500 mg total) by mouth 2 (two) times daily. 10/04/14   Beau Fanny, FNP  clonazePAM (KLONOPIN) 1 MG tablet Take 1 tablet (1 mg total) by mouth 2 (two) times daily. 10/04/14   Beau Fanny, FNP  divalproex (DEPAKOTE ER) 500 MG 24 hr tablet Take 1 tablet (500 mg total) by mouth 2 (two) times daily after a meal. 10/04/14   Beau Fanny, FNP  hydrOXYzine (ATARAX/VISTARIL) 50 MG tablet Take 1 tablet (50 mg total) by mouth every 6 (six) hours as needed (mild anxiety). 10/04/14   Beau Fanny, FNP  ibuprofen (ADVIL,MOTRIN) 800 MG tablet Take 1 tablet (800 mg total) by mouth 3 (three) times daily. 04/20/15   Shawn C Joy, PA-C  menthol-cetylpyridinium (CEPACOL) 3 MG lozenge Take 1 lozenge (3 mg total) by mouth 3 (three) times daily as needed for sore throat. 10/04/14   Beau Fanny, FNP  nicotine (NICODERM CQ - DOSED IN MG/24 HOURS) 21 mg/24hr patch Place 1 patch (21 mg total) onto the skin daily. 10/04/14   Beau Fanny, FNP  ondansetron (  ZOFRAN ODT) 4 MG disintegrating tablet Take 1 tablet (4 mg total) by mouth every 8 (eight) hours as needed for nausea or vomiting. 04/20/15   Shawn C Joy, PA-C  traZODone (DESYREL) 150 MG tablet Take 1 tablet (150 mg total) by mouth at bedtime. 10/04/14   Beau Fanny, FNP  trihexyphenidyl (ARTANE) 2 MG tablet Take 1 tablet (2 mg total) by mouth at bedtime. 10/04/14   Everardo All Withrow, FNP   BP 130/81 mmHg  Pulse 85  Temp(Src) 98.2 F (36.8 C) (Oral)  Resp 18  SpO2 100%   Physical Exam  Constitutional: She is oriented to person, place, and time. She appears well-developed and well-nourished. No distress.  HENT:   Head: Normocephalic and atraumatic.  Mouth/Throat: Oropharynx is clear and moist. No oropharyngeal exudate, posterior oropharyngeal edema, posterior oropharyngeal erythema or tonsillar abscesses.  Eyes: Conjunctivae and EOM are normal.  Neck: Normal range of motion. Neck supple.  Cardiovascular: Normal rate, regular rhythm and normal heart sounds.   Pulmonary/Chest: Effort normal and breath sounds normal. No respiratory distress. She has no wheezes.  Abdominal: Soft. Bowel sounds are normal. There is no tenderness.  Musculoskeletal: Normal range of motion.  Lymphadenopathy:    She has no cervical adenopathy.  Neurological: She is alert and oriented to person, place, and time.  Skin: Skin is warm and dry.  Psychiatric: She has a normal mood and affect. Her behavior is normal.  Nursing note and vitals reviewed.   ED Course  Procedures (including critical care time) DIAGNOSTIC STUDIES: Oxygen Saturation is 100% on RA, normal by my interpretation.    COORDINATION OF CARE: 2:00 PM-Discussed treatment plan with pt at bedside and pt agreed to plan.     Labs Review Labs Reviewed  COMPREHENSIVE METABOLIC PANEL  CBC    Imaging Review No results found. I have personally reviewed and evaluated these lab results as part of my medical decision-making.   EKG Interpretation None      MDM   Final diagnoses:  Body aches  Cough  Sore throat    Betty Parrish presents with generalized body aches with sore throat and cough for the last 3 days.  Patient's presentation is consistent with a viral illness. Symptomatic care is warranted. No abnormalities on the patient's lab. No indication for imaging at this time. Patient is nontoxic appearing, afebrile, not tachycardic, not tachypneic, maintains SPO2 of 100% on room air, and is in no apparent distress. Patient has no signs of sepsis or other serious or life-threatening condition. The patient was given instructions for home care as well as  return precautions. Patient voices understanding of these instructions, accepts the plan, and is comfortable with discharge.   I personally performed the services described in this documentation, which was scribed in my presence. The recorded information has been reviewed and is accurate.      Anselm Pancoast, PA-C 04/20/15 1417  Lavera Guise, MD 04/20/15 1726

## 2015-04-20 NOTE — Discharge Instructions (Signed)
You have been seen today for generalized body aches and sore throat. Your lab tests showed no abnormalities. It is likely that her symptoms are caused by a virus. Viruses do not require antibiotics. Treatment is symptomatic care. Drink plenty of fluids and get plenty of rest. Tylenol or ibuprofen for pain or fever. Tessalon for cough. Zofran for nausea. Warm liquids or Chloraseptic spray may help soothe the sore throat. Follow up with PCP as needed. Return to ED should symptoms worsen.

## 2015-07-03 ENCOUNTER — Emergency Department (HOSPITAL_COMMUNITY)
Admission: EM | Admit: 2015-07-03 | Discharge: 2015-07-03 | Discharge status: Against Medical Advice | Payer: Medicaid Other | Attending: Emergency Medicine | Admitting: Emergency Medicine

## 2015-07-03 ENCOUNTER — Encounter (HOSPITAL_COMMUNITY): Payer: Self-pay | Admitting: *Deleted

## 2015-07-03 DIAGNOSIS — F17210 Nicotine dependence, cigarettes, uncomplicated: Secondary | ICD-10-CM | POA: Insufficient documentation

## 2015-07-03 DIAGNOSIS — R011 Cardiac murmur, unspecified: Secondary | ICD-10-CM | POA: Diagnosis not present

## 2015-07-03 DIAGNOSIS — F419 Anxiety disorder, unspecified: Secondary | ICD-10-CM | POA: Diagnosis present

## 2015-07-03 NOTE — ED Notes (Signed)
Pt arrives to the ER via EMS; pt states that she got into an argument with her mother this am and has been having anxiety off and on throughout the day; pt states that she starts to hyperventilate and then feels weak all over and feels like she can't walk; pt states that the symptoms with improve then return later; pt states that she has PRN medication but did not realize initially that it was anxiety; pt ambulatory to the BR upon arrival without difficulty; pt denies SI / HI

## 2015-07-03 NOTE — ED Notes (Signed)
Bed: [facility] Expected date:  Expected time:  Means of arrival:  Comments: 25 yo F  Weakness  Triage  Psych hx

## 2015-07-03 NOTE — ED Notes (Signed)
Pt states that she feels better and wants to leave; pt handed her the RN her stickers and walked out the door; pt declined to sign AMA form

## 2015-07-08 ENCOUNTER — Emergency Department (HOSPITAL_COMMUNITY)
Admission: EM | Admit: 2015-07-08 | Discharge: 2015-07-08 | Disposition: A | Discharge status: Home / Self Care | Payer: Medicaid Other | Attending: Emergency Medicine | Admitting: Emergency Medicine

## 2015-07-08 DIAGNOSIS — R252 Cramp and spasm: Secondary | ICD-10-CM | POA: Insufficient documentation

## 2015-07-08 DIAGNOSIS — L7622 Postprocedural hemorrhage and hematoma of skin and subcutaneous tissue following other procedure: Secondary | ICD-10-CM | POA: Diagnosis not present

## 2015-07-08 DIAGNOSIS — R011 Cardiac murmur, unspecified: Secondary | ICD-10-CM | POA: Diagnosis not present

## 2015-07-08 DIAGNOSIS — F17210 Nicotine dependence, cigarettes, uncomplicated: Secondary | ICD-10-CM | POA: Insufficient documentation

## 2015-07-08 DIAGNOSIS — Z008 Encounter for other general examination: Secondary | ICD-10-CM | POA: Insufficient documentation

## 2015-07-08 NOTE — ED Notes (Signed)
Per EMS, patient up from a hotel parking lot. Patient states to pharmacy that she thinks she's pregnant, and that she can feel the babies heartbeat in her wrist. Patient c/o muscle tension cramps to her abd. Patient states she has a Jearld AdjutantMarina IUD that expired in January and she needs it removed. Patient states her medications aren't working. Patient will close/blink her eyes and states "Did you see that? I just fainted". Patient becoming increasingly loud and uncooperative in triage. Security called for standby. Patient has bruising to her left forearm from an IV stick a few days ago. Patient states to EMT in triage "she is schizoaffective and if she gets loud or mean it isn't her". Patient was sitting in room, filming snapchat videos, was doing the same in the EMS truck. Patient walked to triage and asked if she could leave. Patient was questioned if she was thinking of harming self or others she said no. Patient stated that she had a ride coming [facility]. Patient left the ER and walked to the lobby.

## 2015-07-09 ENCOUNTER — Encounter (HOSPITAL_COMMUNITY): Payer: Self-pay

## 2015-07-09 ENCOUNTER — Emergency Department (HOSPITAL_COMMUNITY)
Admission: EM | Admit: 2015-07-09 | Discharge: 2015-07-09 | Disposition: A | Discharge status: Home / Self Care | Payer: Medicaid Other | Attending: Emergency Medicine | Admitting: Emergency Medicine

## 2015-07-09 DIAGNOSIS — F17210 Nicotine dependence, cigarettes, uncomplicated: Secondary | ICD-10-CM | POA: Diagnosis not present

## 2015-07-09 DIAGNOSIS — Z046 Encounter for general psychiatric examination, requested by authority: Secondary | ICD-10-CM | POA: Diagnosis present

## 2015-07-09 DIAGNOSIS — D573 Sickle-cell trait: Secondary | ICD-10-CM | POA: Insufficient documentation

## 2015-07-09 DIAGNOSIS — F259 Schizoaffective disorder, unspecified: Secondary | ICD-10-CM | POA: Diagnosis not present

## 2015-07-09 DIAGNOSIS — F909 Attention-deficit hyperactivity disorder, unspecified type: Secondary | ICD-10-CM | POA: Diagnosis not present

## 2015-07-09 DIAGNOSIS — Z79899 Other long term (current) drug therapy: Secondary | ICD-10-CM | POA: Diagnosis not present

## 2015-07-09 DIAGNOSIS — Z791 Long term (current) use of non-steroidal anti-inflammatories (NSAID): Secondary | ICD-10-CM | POA: Diagnosis not present

## 2015-07-09 LAB — CBC
HEMATOCRIT: 37.5 % (ref 36.0–46.0)
Hemoglobin: 13.4 g/dL (ref 12.0–15.0)
MCH: 31.5 pg (ref 26.0–34.0)
MCHC: 35.7 g/dL (ref 30.0–36.0)
MCV: 88.2 fL (ref 78.0–100.0)
Platelets: 217 10*3/uL (ref 150–400)
RBC: 4.25 MIL/uL (ref 3.87–5.11)
RDW: 13.1 % (ref 11.5–15.5)
WBC: 6.2 10*3/uL (ref 4.0–10.5)

## 2015-07-09 LAB — BASIC METABOLIC PANEL
Anion gap: 8 (ref 5–15)
BUN: 7 mg/dL (ref 6–20)
CALCIUM: 9 mg/dL (ref 8.9–10.3)
CO2: 24 mmol/L (ref 22–32)
CREATININE: 0.74 mg/dL (ref 0.44–1.00)
Chloride: 104 mmol/L (ref 101–111)
GFR calc Af Amer: >60 mL/min (ref >60)
GFR calc non Af Amer: >60 mL/min (ref >60)
GLUCOSE: 88 mg/dL (ref 65–99)
Potassium: 4.2 mmol/L (ref 3.5–5.1)
Sodium: 136 mmol/L (ref 135–145)

## 2015-07-09 LAB — VALPROIC ACID LEVEL: Valproic Acid Lvl: <10 ug/mL — ABNORMAL LOW (ref 50.0–100.0)

## 2015-07-09 LAB — ETHANOL: Alcohol, Ethyl (B): <5 mg/dL (ref <5)

## 2015-07-09 NOTE — ED Provider Notes (Signed)
CSN: [id number]     Arrival date & time 07/09/15  1419 History   First MD Initiated Contact with Patient 07/09/15 1444     Chief Complaint  Patient presents with  . Medical Clearance  PT BROUGHT IN TODAY BY EMS BECAUSE SHE WAS ACTING ABNORMALLY AT THE BUS DEPOT.  PT WAS JUST D/C'D THIS AM FROM [facility] AND HAD A NEGATIVE PREG TEST THERE.  SHE TELLS ME THAT SHE THINKS SHE IS PREGNANT AND THAT THE BABY'S FATHER IS A CHILD MOLESTER.  EMS SAID THAT PT DID COCAINE TODAY.  EMS GAVE PT HALDOL 5 MG IM BECAUSE SHE WAS SO AGITATED.  (Consider location/radiation/quality/duration/timing/severity/associated sxs/prior Treatment) HPI  Past Medical History  Diagnosis Date  . Anxiety   . Heart murmur   . Sickle cell trait (HCC)   . Schizo-affective psychosis (HCC)   . ADHD (attention deficit hyperactivity disorder)    History reviewed. No pertinent past surgical history. Family History  Problem Relation Age of Onset  . Sickle cell anemia Mother    Social History  Substance Use Topics  . Smoking status: Current Every Day Smoker -- 1.00 packs/day    Types: Cigarettes  . Smokeless tobacco: None  . Alcohol Use: Yes   OB History    No data available     Review of Systems  Gastrointestinal: Positive for nausea.  All other systems reviewed and are negative.     Allergies  Hawaiian tropic dark tanning with sunscreen 2 oil; Red dye; and Zyprexa  Home Medications   Prior to Admission medications   Medication Sig Start Date End Date Taking? Authorizing Provider  ARIPiprazole (ABILIFY) 15 MG tablet Take 1 tablet (15 mg total) by mouth daily. 10/04/14   Beau FannyJohn C Withrow, FNP  benzonatate (TESSALON) 100 MG capsule Take 1 capsule (100 mg total) by mouth every 8 (eight) hours. 04/20/15   Shawn C Joy, PA-C  cephALEXin (KEFLEX) 500 MG capsule Take 1 capsule (500 mg total) by mouth 2 (two) times daily. 10/04/14   Beau FannyJohn C Withrow, FNP  clonazePAM (KLONOPIN) 1 MG tablet Take 1 tablet (1 mg total) by mouth 2 (two)  times daily. 10/04/14   Beau FannyJohn C Withrow, FNP  divalproex (DEPAKOTE ER) 500 MG 24 hr tablet Take 1 tablet (500 mg total) by mouth 2 (two) times daily after a meal. 10/04/14   Beau FannyJohn C Withrow, FNP  hydrOXYzine (ATARAX/VISTARIL) 50 MG tablet Take 1 tablet (50 mg total) by mouth every 6 (six) hours as needed (mild anxiety). 10/04/14   Beau FannyJohn C Withrow, FNP  ibuprofen (ADVIL,MOTRIN) 800 MG tablet Take 1 tablet (800 mg total) by mouth 3 (three) times daily. 04/20/15   Shawn C Joy, PA-C  menthol-cetylpyridinium (CEPACOL) 3 MG lozenge Take 1 lozenge (3 mg total) by mouth 3 (three) times daily as needed for sore throat. 10/04/14   Beau FannyJohn C Withrow, FNP  nicotine (NICODERM CQ - DOSED IN MG/24 HOURS) 21 mg/24hr patch Place 1 patch (21 mg total) onto the skin daily. 10/04/14   Beau FannyJohn C Withrow, FNP  ondansetron (ZOFRAN ODT) 4 MG disintegrating tablet Take 1 tablet (4 mg total) by mouth every 8 (eight) hours as needed for nausea or vomiting. 04/20/15   Shawn C Joy, PA-C  traZODone (DESYREL) 150 MG tablet Take 1 tablet (150 mg total) by mouth at bedtime. 10/04/14   Beau FannyJohn C Withrow, FNP  trihexyphenidyl (ARTANE) 2 MG tablet Take 1 tablet (2 mg total) by mouth at bedtime. 10/04/14   Beau FannyJohn C Withrow, FNP  BP 111/69 mmHg  Pulse 64  Temp(Src) 97.8 F (36.6 C) (Oral)  Resp 16  SpO2 100%  LMP 04/24/2015 Physical Exam  Constitutional: She appears well-developed and well-nourished.  HENT:  Head: Normocephalic and atraumatic.  Right Ear: External ear normal.  Left Ear: External ear normal.  Nose: Nose normal.  Mouth/Throat: Oropharynx is clear and moist.  Eyes: Conjunctivae are normal. Pupils are equal, round, and reactive to light.  Neck: Normal range of motion. Neck supple.  Cardiovascular: Normal rate, regular rhythm, normal heart sounds and intact distal pulses.   Pulmonary/Chest: Effort normal and breath sounds normal.  Abdominal: Soft. Bowel sounds are normal.  Musculoskeletal: Normal range of motion.  Neurological: She  is alert.  Skin: Skin is warm and dry.  Psychiatric: Her mood appears anxious. Her affect is labile and inappropriate. Her speech is tangential. She is agitated. Thought content is delusional. She expresses impulsivity and inappropriate judgment. She expresses no suicidal plans and no homicidal plans.  Nursing note and vitals reviewed.   ED Course  Procedures (including critical care time) Labs Review Labs Reviewed  CBC  BASIC METABOLIC PANEL  URINE RAPID DRUG SCREEN, HOSP PERFORMED  ETHANOL  VALPROIC ACID LEVEL  POC URINE PREG, ED    Imaging Review No results found. I have personally reviewed and evaluated these images and lab results as part of my medical decision-making.   EKG Interpretation None      MDM  PT SAID WAS INITIALLY OK WITH GETTING EVAL, BUT THEN BECAME MORE AGITATED AND WANTED TO GO.  SHE IS NOT SUICIDAL OR HOMICIDAL, SO I CAN'T KEEP HER HERE AGAINST HER WILL.  PT REFUSED DISCHARGE INSTR OR BUS PASS, SHE JUST WALKED OUT.  Final diagnoses:  Schizoaffective disorder, unspecified type (HCC)     Jacalyn Lefevre, MD 07/09/15 [phone]

## 2015-07-09 NOTE — ED Notes (Signed)
Pt given crackers and warm blanket.

## 2015-07-09 NOTE — Discharge Instructions (Signed)
Schizoaffective Disorder  Schizoaffective disorder (ScAD) is a mental illness. It causes symptoms that are a mixture of schizophrenia (a psychotic disorder) and an affective (mood) disorder. The schizophrenic symptoms may include delusions, hallucinations, or odd behavior. The mood symptoms may be similar to major depression or bipolar disorder. ScAD may interfere with personal relationships or normal daily activities. People with ScAD are at increased risk for job loss, social isolation, physical health problems, anxiety and substance use disorders, and suicide.  ScAD usually occurs in cycles. Periods of severe symptoms are followed by periods of  less severe symptoms or improvement. The illness affects men and women equally but usually appears at an earlier age (teenage or early adult years) in men. People who have family members with schizophrenia, bipolar disorder, or ScAD are at higher risk of developing ScAD.  SYMPTOMS   At any one time, people with ScAD may have psychotic symptoms only or both psychotic and mood symptoms. The psychotic symptoms include one or more of the following:  · Hearing, seeing, or feeling things that are not there (hallucinations).    · Having fixed, false beliefs (delusions). The delusions usually are of being attacked, harassed, cheated, persecuted, or conspired against (paranoid delusions).  · Speaking in a way that makes no sense to others (disorganized speech).  The psychotic symptoms of ScAD may also include confusing or odd behavior or any of the negative symptoms of schizophrenia. These include loss of motivation for normal daily activities, such as bathing or grooming, withdrawal from other people, and lack of emotions.     The mood symptoms of ScAD occur more often than not. They resemble major depressive disorder or bipolar mania. Symptoms of major depression include depressed mood and four or more of the following:  · Loss of interest in usually pleasurable activities  (anhedonia).  · Sleeping more or less than normal.  · Feeling worthless or excessively guilty.  · Lack of energy or motivation.  · Trouble concentrating.  · Eating more or less than usual.  · Thinking a lot about death or suicide.  Symptoms of bipolar mania include abnormally elevated or irritable mood and increased energy or activity, plus three or more of the following:    · More confidence than normal or feeling that you are able to do anything (grandiosity).  · Feeling rested with less sleep than normal.    · Being easily distracted.    · Talking more than usual or feeling pressured to keep talking.    · Feeling that your thoughts are racing.  · Engaging in high-risk activities such as buying sprees or foolish business decisions.  DIAGNOSIS   ScAD is diagnosed through an assessment by your health care provider. Your health care provider will observe and ask questions about your thoughts, behavior, mood, and ability to function in daily life. Your health care provider may also ask questions about your medical history and use of drugs, including prescription medicines. Your health care provider may also order blood tests and imaging exams. Certain medical conditions and substances can cause symptoms that resemble ScAD. Your health care provider may refer you to a mental health specialist for evaluation.   ScAD is divided into two types. The depressive type is diagnosed if your mood symptoms are limited to major depression. The bipolar type is diagnosed if your mood symptoms are manic or a mixture of manic and depressive symptoms  TREATMENT   ScAD is usually a lifelong illness. Long-term treatment is necessary. The following treatments are available:  · Medicine. Different types of   medicine are used to treat ScAD. The exact combination depends on the type and severity of your symptoms. Antipsychotic medicine is used to control psychotic symptoms such as delusions, paranoia, and hallucinations. Mood stabilizers can  even the highs and lows of bipolar manic mood swings. Antidepressant medicines are used to treat major depressive symptoms.  · Counseling or talk therapy. Individual, group, or family counseling may be helpful in providing education, support, and guidance. Many people with ScAD also benefit from social skills and job skills (vocational) training.  A combination of medicine and counseling is usually best for managing the disorder over time. A procedure in which electricity is applied to the brain through the scalp (electroconvulsive therapy) may be used to treat people with severe manic symptoms that do not respond to medicine and counseling.  HOME CARE INSTRUCTIONS   · Take all your medicine as prescribed.  · Check with your health care provider before starting new prescription or over-the-counter medicines.  · Keep all follow up appointments with your health care provider.  SEEK MEDICAL CARE IF:   · If you are not able to take your medicines as prescribed.  · If your symptoms get worse.  SEEK IMMEDIATE MEDICAL CARE IF:   · You have serious thoughts about hurting yourself or others.     This information is not intended to replace advice given to you by your health care provider. Make sure you discuss any questions you have with your health care provider.     Document Released: 07/07/2006 Document Revised: 03/17/2014 Document Reviewed: 10/08/2012  Elsevier Interactive Patient Education ©2016 [employer]

## 2015-07-09 NOTE — ED Notes (Signed)
Pt reports "I've been popping pills like I'm supposed to, but they aren't working."  Pt admits to smoking marijuana.  Denies ETOH and other drug use.  Pt sts "I might be pregnant."  LMP 04/2015.  Pt reports taking home pregnancy tests x w/ negative result.
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
## 2015-07-09 NOTE — ED Notes (Signed)
Pt cussing, screaming, and threatening staff.  [place] MD reports that the Pt is ok to leave.  Pt refused vitals, to wait on d/c paperwork, and a bus pass.  Pt sts "I have some change in my pocket.  I just need to get out of here."

## 2015-07-13 ENCOUNTER — Emergency Department (HOSPITAL_COMMUNITY)
Admission: EM | Admit: 2015-07-13 | Discharge: 2015-07-16 | Disposition: A | Discharge status: Other Acute Inpatient Hospital | Payer: Medicaid Other | Attending: Emergency Medicine | Admitting: Emergency Medicine

## 2015-07-13 ENCOUNTER — Encounter (HOSPITAL_COMMUNITY): Payer: Self-pay

## 2015-07-13 DIAGNOSIS — F909 Attention-deficit hyperactivity disorder, unspecified type: Secondary | ICD-10-CM | POA: Diagnosis not present

## 2015-07-13 DIAGNOSIS — F419 Anxiety disorder, unspecified: Secondary | ICD-10-CM | POA: Insufficient documentation

## 2015-07-13 DIAGNOSIS — Z791 Long term (current) use of non-steroidal anti-inflammatories (NSAID): Secondary | ICD-10-CM | POA: Diagnosis not present

## 2015-07-13 DIAGNOSIS — F17210 Nicotine dependence, cigarettes, uncomplicated: Secondary | ICD-10-CM | POA: Insufficient documentation

## 2015-07-13 DIAGNOSIS — Z792 Long term (current) use of antibiotics: Secondary | ICD-10-CM | POA: Diagnosis not present

## 2015-07-13 DIAGNOSIS — F250 Schizoaffective disorder, bipolar type: Secondary | ICD-10-CM

## 2015-07-13 DIAGNOSIS — Z79899 Other long term (current) drug therapy: Secondary | ICD-10-CM | POA: Diagnosis not present

## 2015-07-13 DIAGNOSIS — F259 Schizoaffective disorder, unspecified: Secondary | ICD-10-CM | POA: Insufficient documentation

## 2015-07-13 DIAGNOSIS — R451 Restlessness and agitation: Secondary | ICD-10-CM | POA: Diagnosis present

## 2015-07-13 LAB — CBC WITH DIFFERENTIAL/PLATELET
BASOS ABS: 0 10*3/uL (ref 0.0–0.1)
BASOS PCT: 0 %
Eosinophils Absolute: 0.5 10*3/uL (ref 0.0–0.7)
Eosinophils Relative: 7 %
HCT: 40.6 % (ref 36.0–46.0)
Hemoglobin: 14.5 g/dL (ref 12.0–15.0)
Lymphocytes Relative: 45 %
Lymphs Abs: 3.7 10*3/uL (ref 0.7–4.0)
MCH: 31.9 pg (ref 26.0–34.0)
MCHC: 35.7 g/dL (ref 30.0–36.0)
MCV: 89.2 fL (ref 78.0–100.0)
MONOS PCT: 7 %
Monocytes Absolute: 0.6 10*3/uL (ref 0.1–1.0)
NEUTROS PCT: 41 %
Neutro Abs: 3.3 10*3/uL (ref 1.7–7.7)
Platelets: 250 10*3/uL (ref 150–400)
RBC: 4.55 MIL/uL (ref 3.87–5.11)
RDW: 13.1 % (ref 11.5–15.5)
WBC: 8.2 10*3/uL (ref 4.0–10.5)

## 2015-07-13 LAB — RAPID URINE DRUG SCREEN, HOSP PERFORMED
AMPHETAMINES: NOT DETECTED
BARBITURATES: NOT DETECTED
BENZODIAZEPINES: NOT DETECTED
COCAINE: NOT DETECTED
Opiates: NOT DETECTED
TETRAHYDROCANNABINOL: POSITIVE — AB

## 2015-07-13 LAB — COMPREHENSIVE METABOLIC PANEL
ALT: 25 U/L (ref 14–54)
ANION GAP: 11 (ref 5–15)
AST: 34 U/L (ref 15–41)
Albumin: 5 g/dL (ref 3.5–5.0)
Alkaline Phosphatase: 59 U/L (ref 38–126)
BUN: 9 mg/dL (ref 6–20)
CHLORIDE: 104 mmol/L (ref 101–111)
CO2: 27 mmol/L (ref 22–32)
Calcium: 10.1 mg/dL (ref 8.9–10.3)
Creatinine, Ser: 0.89 mg/dL (ref 0.44–1.00)
GFR calc Af Amer: >60 mL/min (ref >60)
GFR calc non Af Amer: >60 mL/min (ref >60)
GLUCOSE: 93 mg/dL (ref 65–99)
POTASSIUM: 4.3 mmol/L (ref 3.5–5.1)
Sodium: 142 mmol/L (ref 135–145)
Total Bilirubin: 0.7 mg/dL (ref 0.3–1.2)
Total Protein: 7.7 g/dL (ref 6.5–8.1)

## 2015-07-13 LAB — I-STAT BETA HCG BLOOD, ED (MC, WL, AP ONLY): I-stat hCG, quantitative: <5 m[IU]/mL (ref <5)

## 2015-07-13 LAB — VALPROIC ACID LEVEL: Valproic Acid Lvl: <10 ug/mL — ABNORMAL LOW (ref 50.0–100.0)

## 2015-07-13 LAB — ETHANOL: Alcohol, Ethyl (B): <5 mg/dL (ref <5)

## 2015-07-13 LAB — PREGNANCY, URINE: PREG TEST UR: NEGATIVE

## 2015-07-13 MED ORDER — NICOTINE 21 MG/24HR TD PT24
21.0000 mg | MEDICATED_PATCH | Freq: Every day | TRANSDERMAL | Status: DC
  Filled 2015-07-13: qty 1

## 2015-07-13 MED ORDER — ARIPIPRAZOLE 15 MG PO TABS
15.0000 mg | ORAL_TABLET | Freq: Every day | ORAL | Status: DC
  Administered 2015-07-14 – 2015-07-16 (×3): 15 mg via ORAL
  Filled 2015-07-13 (×3): qty 1

## 2015-07-13 MED ORDER — ACETAMINOPHEN 325 MG PO TABS
650.0000 mg | ORAL_TABLET | ORAL | Status: DC | PRN
  Administered 2015-07-14 – 2015-07-15 (×2): 650 mg via ORAL
  Filled 2015-07-13 (×2): qty 2

## 2015-07-13 MED ORDER — TRIHEXYPHENIDYL HCL 2 MG PO TABS
2.0000 mg | ORAL_TABLET | Freq: Every day | ORAL | Status: DC
  Administered 2015-07-14 – 2015-07-15 (×2): 2 mg via ORAL
  Filled 2015-07-13 (×3): qty 1

## 2015-07-13 MED ORDER — ONDANSETRON HCL 4 MG PO TABS: 4.0000 mg | ORAL_TABLET | Freq: Three times a day (TID) | ORAL | Status: DC | PRN

## 2015-07-13 MED ORDER — BENZONATATE 100 MG PO CAPS
100.0000 mg | ORAL_CAPSULE | Freq: Three times a day (TID) | ORAL | Status: DC
  Administered 2015-07-14 – 2015-07-16 (×4): 100 mg via ORAL
  Filled 2015-07-13 (×5): qty 1

## 2015-07-13 MED ORDER — HYDROXYZINE HCL 25 MG PO TABS
50.0000 mg | ORAL_TABLET | Freq: Four times a day (QID) | ORAL | Status: DC | PRN
  Administered 2015-07-14 – 2015-07-16 (×3): 50 mg via ORAL
  Filled 2015-07-13 (×3): qty 2

## 2015-07-13 MED ORDER — TRAZODONE HCL 50 MG PO TABS
150.0000 mg | ORAL_TABLET | Freq: Every day | ORAL | Status: DC
  Administered 2015-07-13 – 2015-07-15 (×3): 150 mg via ORAL
  Filled 2015-07-13 (×3): qty 1

## 2015-07-13 MED ORDER — LORAZEPAM 1 MG PO TABS: 1.0000 mg | ORAL_TABLET | Freq: Three times a day (TID) | ORAL | Status: DC | PRN

## 2015-07-13 MED ORDER — CLONAZEPAM 1 MG PO TABS
1.0000 mg | ORAL_TABLET | Freq: Two times a day (BID) | ORAL | Status: DC
  Filled 2015-07-13: qty 1

## 2015-07-13 MED ORDER — DIVALPROEX SODIUM ER 500 MG PO TB24
500.0000 mg | ORAL_TABLET | Freq: Two times a day (BID) | ORAL | Status: DC
  Administered 2015-07-14 – 2015-07-16 (×4): 500 mg via ORAL
  Filled 2015-07-13 (×6): qty 1

## 2015-07-13 MED ORDER — ONDANSETRON 4 MG PO TBDP
4.0000 mg | ORAL_TABLET | Freq: Three times a day (TID) | ORAL | Status: DC | PRN
Start: 2015-07-13 — End: 2015-07-16
  Administered 2015-07-15 – 2015-07-16 (×2): 4 mg via ORAL
  Filled 2015-07-13 (×2): qty 1

## 2015-07-13 MED ORDER — NICOTINE 21 MG/24HR TD PT24: 21.0000 mg | MEDICATED_PATCH | Freq: Every day | TRANSDERMAL | Status: DC

## 2015-07-13 MED ORDER — LORAZEPAM 1 MG PO TABS
2.0000 mg | ORAL_TABLET | Freq: Once | ORAL | Status: AC
  Administered 2015-07-13: 2 mg via ORAL
  Filled 2015-07-13: qty 2

## 2015-07-13 NOTE — ED Notes (Signed)
Pt rapid pressured speech and cussing loudly. Patient hyper verbal and demanding. Patient denying need for service and walking past the desk demanding attention.
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
## 2015-07-13 NOTE — BH Assessment (Signed)
Assessment completed. Consulted with Dr. Dub MikesLugo who recommends inpatient treatment at this time.    Davina PokeJoVea Marisol Glazer, LCSW Therapeutic Triage Specialist [facility] 07/13/2015 8:44 PM

## 2015-07-13 NOTE — ED Provider Notes (Signed)
CSN: [id number]     Arrival date & time 07/13/15  1744 History   First MD Initiated Contact with Patient 07/13/15 1759     No chief complaint on file.    (Consider location/radiation/quality/duration/timing/severity/associated sxs/prior Treatment) HPI Comments: Patient here with increased agitation and combativeness. Patient has an act team who placed the patient under involuntary commitment due to her behavior. She denies any suicidal or homicidal ideations. States that she needs extra doses of her regular home meds. Denies any command hallucinations. Denies any recent alcohol or illicit drug use. Symptoms progressively worse over the entire day and no treatment for this particular symptoms used prior to arrival  The history is provided by the patient.    Past Medical History  Diagnosis Date  . Anxiety   . Heart murmur   . Sickle cell trait (HCC)   . Schizo-affective psychosis (HCC)   . ADHD (attention deficit hyperactivity disorder)    No past surgical history on file. Family History  Problem Relation Age of Onset  . Sickle cell anemia Mother    Social History  Substance Use Topics  . Smoking status: Current Every Day Smoker -- 1.00 packs/day    Types: Cigarettes  . Smokeless tobacco: Not on file  . Alcohol Use: Yes   OB History    No data available     Review of Systems  All other systems reviewed and are negative.     Allergies  Hawaiian tropic dark tanning with sunscreen 2 oil; Red dye; and Zyprexa  Home Medications   Prior to Admission medications   Medication Sig Start Date End Date Taking? Authorizing Provider  ARIPiprazole (ABILIFY) 15 MG tablet Take 1 tablet (15 mg total) by mouth daily. 10/04/14   Beau Fanny, FNP  benzonatate (TESSALON) 100 MG capsule Take 1 capsule (100 mg total) by mouth every 8 (eight) hours. 04/20/15   Shawn C Joy, PA-C  cephALEXin (KEFLEX) 500 MG capsule Take 1 capsule (500 mg total) by mouth 2 (two) times daily. 10/04/14   Beau Fanny, FNP  clonazePAM (KLONOPIN) 1 MG tablet Take 1 tablet (1 mg total) by mouth 2 (two) times daily. 10/04/14   Beau Fanny, FNP  divalproex (DEPAKOTE ER) 500 MG 24 hr tablet Take 1 tablet (500 mg total) by mouth 2 (two) times daily after a meal. 10/04/14   Beau Fanny, FNP  hydrOXYzine (ATARAX/VISTARIL) 50 MG tablet Take 1 tablet (50 mg total) by mouth every 6 (six) hours as needed (mild anxiety). 10/04/14   Beau Fanny, FNP  ibuprofen (ADVIL,MOTRIN) 800 MG tablet Take 1 tablet (800 mg total) by mouth 3 (three) times daily. 04/20/15   Shawn C Joy, PA-C  menthol-cetylpyridinium (CEPACOL) 3 MG lozenge Take 1 lozenge (3 mg total) by mouth 3 (three) times daily as needed for sore throat. 10/04/14   Beau Fanny, FNP  nicotine (NICODERM CQ - DOSED IN MG/24 HOURS) 21 mg/24hr patch Place 1 patch (21 mg total) onto the skin daily. 10/04/14   Beau Fanny, FNP  ondansetron (ZOFRAN ODT) 4 MG disintegrating tablet Take 1 tablet (4 mg total) by mouth every 8 (eight) hours as needed for nausea or vomiting. 04/20/15   Shawn C Joy, PA-C  traZODone (DESYREL) 150 MG tablet Take 1 tablet (150 mg total) by mouth at bedtime. 10/04/14   Beau Fanny, FNP  trihexyphenidyl (ARTANE) 2 MG tablet Take 1 tablet (2 mg total) by mouth at bedtime. 10/04/14   Jonny Ruiz  Chipper Herb Withrow, FNP   LMP 04/24/2015 Physical Exam  Constitutional: She is oriented to person, place, and time. She appears well-developed and well-nourished.  Non-toxic appearance. No distress.  HENT:  Head: Normocephalic and atraumatic.  Eyes: Conjunctivae, EOM and lids are normal. Pupils are equal, round, and reactive to light.  Neck: Normal range of motion. Neck supple. No tracheal deviation present. No thyroid mass present.  Cardiovascular: Normal rate, regular rhythm and normal heart sounds.  Exam reveals no gallop.   No murmur heard. Pulmonary/Chest: Effort normal and breath sounds normal. No stridor. No respiratory distress. She has no decreased breath  sounds. She has no wheezes. She has no rhonchi. She has no rales.  Abdominal: Soft. Normal appearance and bowel sounds are normal. She exhibits no distension. There is no tenderness. There is no rebound and no CVA tenderness.  Musculoskeletal: Normal range of motion. She exhibits no edema or tenderness.  Neurological: She is alert and oriented to person, place, and time. She has normal strength. No cranial nerve deficit or sensory deficit. GCS eye subscore is 4. GCS verbal subscore is 5. GCS motor subscore is 6.  Skin: Skin is warm and dry. No abrasion and no rash noted.  Psychiatric: Her mood appears anxious. Her speech is rapid and/or pressured. She is agitated and aggressive. She expresses no suicidal plans and no homicidal plans.  Nursing note and vitals reviewed.   ED Course  Procedures (including critical care time) Labs Review Labs Reviewed  CBC WITH DIFFERENTIAL/PLATELET  COMPREHENSIVE METABOLIC PANEL  ETHANOL  URINE RAPID DRUG SCREEN, HOSP PERFORMED  I-STAT BETA HCG BLOOD, ED ([facility], [facility], [facility] ONLY)    Imaging Review No results found. I have personally reviewed and evaluated these images and lab results as part of my medical decision-making.   EKG Interpretation None      MDM   Final diagnoses:  None    Patient given Ativan and awaiting labs and medical clearance. Will consult psychiatry    Lorre NickAnthony Rambo Sarafian, MD 07/13/15 2101

## 2015-07-13 NOTE — ED Notes (Signed)
Pt admitted to room #43 .Pt presents with manic behavior. Loud, yelling, verbally aggressive, screaming, pressured speech, restlessness. Non compliant with assessment questions. Refusing to take certain medication, reports "Depakote does not help me." Reports she is here to get medication to help her nerves. Pt denying SI. Special checks q 15 mins in place for safety. Video monitoring in place.

## 2015-07-14 DIAGNOSIS — F250 Schizoaffective disorder, bipolar type: Secondary | ICD-10-CM | POA: Diagnosis not present

## 2015-07-14 MED ORDER — LORAZEPAM 2 MG/ML IJ SOLN
2.0000 mg | Freq: Once | INTRAMUSCULAR | Status: AC
  Administered 2015-07-14: 2 mg via INTRAMUSCULAR
  Filled 2015-07-14: qty 1

## 2015-07-14 MED ORDER — DIPHENHYDRAMINE HCL 50 MG/ML IJ SOLN
50.0000 mg | Freq: Once | INTRAMUSCULAR | Status: AC
  Administered 2015-07-14: 50 mg via INTRAMUSCULAR
  Filled 2015-07-14: qty 1

## 2015-07-14 MED ORDER — ZIPRASIDONE MESYLATE 20 MG IM SOLR
20.0000 mg | Freq: Once | INTRAMUSCULAR | Status: AC
  Administered 2015-07-14: 20 mg via INTRAMUSCULAR
  Filled 2015-07-14: qty 20

## 2015-07-14 MED ORDER — STERILE WATER FOR INJECTION IJ SOLN
INTRAMUSCULAR | Status: AC
  Administered 2015-07-14: 15:00:00
  Filled 2015-07-14: qty 10

## 2015-07-14 NOTE — Progress Notes (Signed)
Disposition CSW completed patient referrals to the following inpatient psych facilities:  [facility]  CSW will continue to follow patient for placement needs.  Seward SpeckLeo Iran [facility] Institute For RehabilitationCSW,LCAS Behavioral Health Disposition CSW [phone]

## 2015-07-14 NOTE — ED Notes (Signed)
Patient noted in room resting. No complaints, stable, in no acute distress. Q15 minute rounds and monitoring via security cameras continue for safety. Patient denies current SI, HI, A/ V H, depression, anxiety, or pain. Patient lethargic but ambulatory with gait steady.

## 2015-07-14 NOTE — ED Notes (Signed)
Pt is asleep; RN notified and told not to wake.  

## 2015-07-14 NOTE — ED Notes (Signed)
Patient loud and disruptive.  She has been talking in a very inappropriately loud voice and cursing, she is also threatening to sue all the staff and accusing staff of not doing their jobs.  She was unable to be de-escalated verbally and was given injections of ziprasidone, diphenhydramine and lorazepam.  She is still yelling and cursing at this time.
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
## 2015-07-14 NOTE — ED Notes (Signed)
Rhealyn did get some sleep after her injections.  She did wake up for a short time and wanted to know about what was going on with her placement.  I informed her we are continuing to look for a behavioral health bed.  She then asked to speak privately with me and apologized for her behavior this morning.  I reminded her that the only way she can focus on caring for her daughter is to get herself well.

## 2015-07-15 LAB — URINALYSIS, ROUTINE W REFLEX MICROSCOPIC
BILIRUBIN URINE: NEGATIVE
Glucose, UA: NEGATIVE mg/dL
HGB URINE DIPSTICK: NEGATIVE
Ketones, ur: NEGATIVE mg/dL
Nitrite: NEGATIVE
PROTEIN: NEGATIVE mg/dL
Specific Gravity, Urine: 1.001 — ABNORMAL LOW (ref 1.005–1.030)
pH: 6.5 (ref 5.0–8.0)

## 2015-07-15 LAB — URINE MICROSCOPIC-ADD ON

## 2015-07-15 MED ORDER — METRONIDAZOLE 500 MG PO TABS
2000.0000 mg | ORAL_TABLET | Freq: Once | ORAL | Status: AC
  Administered 2015-07-15: 2000 mg via ORAL
  Filled 2015-07-15: qty 4

## 2015-07-15 MED ORDER — DIPHENHYDRAMINE HCL 50 MG/ML IJ SOLN
50.0000 mg | Freq: Once | INTRAMUSCULAR | Status: DC
  Filled 2015-07-15: qty 1

## 2015-07-15 MED ORDER — DIPHENHYDRAMINE HCL 50 MG/ML IJ SOLN: 50.0000 mg | Freq: Once | INTRAMUSCULAR | Status: DC

## 2015-07-15 MED ORDER — FLUCONAZOLE 200 MG PO TABS
200.0000 mg | ORAL_TABLET | Freq: Once | ORAL | Status: AC
  Administered 2015-07-15: 200 mg via ORAL
  Filled 2015-07-15: qty 1

## 2015-07-15 NOTE — ED Notes (Signed)
Pt took ordered Diflucan. No s/s of allergic reaction. Pt reports "I feel good" Will continue to monitor.

## 2015-07-15 NOTE — ED Notes (Signed)
Pt requesting to speak to Child psychotherapistsocial worker, Child psychotherapistsocial worker, Xcel Energyegina notified.

## 2015-07-15 NOTE — ED Notes (Signed)
Pt up at nurses station, grandiose, loud, manic behavior. Special checks q 15 mins in place for safety. Video monitoring in place.

## 2015-07-15 NOTE — ED Notes (Signed)
Regina, LCSW at bedside.

## 2015-07-15 NOTE — ED Notes (Signed)
Pt on hallway phone, irritability noted.

## 2015-07-15 NOTE — ED Provider Notes (Signed)
Patient complaining of urinary frequency and pain. UA ordered as one has not been done previously. Found to have Trichomonas. Will treat with 2 g of Flagyl.  Melene Planan Aqib Lough, DO 07/15/15 [phone]

## 2015-07-15 NOTE — ED Notes (Signed)
Order clarification this nurse spoke with Jorene MinorsJameson, NP instructed to give Benadryl IM if pt has an allergic reaction to Diflucan medication ordered.

## 2015-07-15 NOTE — BHH Counselor (Signed)
Pt denies SI/HI. Pt denies AVH. Pt states she feels stable because she is back on her medication. Pt states she would like to change her ACT team because her current ACT team is not supportive.  Betty PhoenixBrandi Kryslyn Helbig, [facility] Triage Specialist

## 2015-07-15 NOTE — ED Notes (Signed)
Pt c/o nausea and feeling constipated. Pt reports having had bowels move the ,morning of 5/6,  prune juice provided and Zofran provided.

## 2015-07-15 NOTE — ED Notes (Signed)
Pt reporting she has a yeast infection. Jorene MinorsJameson, NP notified.

## 2015-07-15 NOTE — ED Notes (Signed)
Patient noted in day room. Complaints of the decision to make her in patient, stable, in no acute distress. Q15 minute rounds and monitoring via security cameras continue for safety. Pt mania reduced when compared to time of admission, but pt still labile, attention seeking, and grandiose at times. Pt denies SI/ HI, A/ V H, depression, anxiety, and pain.

## 2015-07-15 NOTE — ED Notes (Signed)
Pt compliant with morning medication regimen. Manic behavior noted, pt loud and singing. Pt denies SI/HI. Denies AVH. Pt reporting she is ready to go home. Special checks q 15 mins in place for safety. Video monitoring in place.

## 2015-07-16 LAB — GC/CHLAMYDIA PROBE AMP (~~LOC~~) NOT AT ARMC
Chlamydia: NEGATIVE
Neisseria Gonorrhea: NEGATIVE

## 2015-07-16 MED ORDER — ZIPRASIDONE MESYLATE 20 MG IM SOLR
10.0000 mg | Freq: Once | INTRAMUSCULAR | Status: AC
  Administered 2015-07-16: 10 mg via INTRAMUSCULAR
  Filled 2015-07-16: qty 20

## 2015-07-16 MED ORDER — LORAZEPAM 1 MG PO TABS
2.0000 mg | ORAL_TABLET | Freq: Once | ORAL | Status: AC
  Administered 2015-07-16: 2 mg via ORAL
  Filled 2015-07-16: qty 2

## 2015-07-16 MED ORDER — DIPHENHYDRAMINE HCL 50 MG/ML IJ SOLN
50.0000 mg | Freq: Once | INTRAMUSCULAR | Status: AC
  Administered 2015-07-16: 50 mg via INTRAMUSCULAR
  Filled 2015-07-16: qty 1

## 2015-07-16 MED ORDER — STERILE WATER FOR INJECTION IJ SOLN
INTRAMUSCULAR | Status: AC
  Administered 2015-07-16: 12:00:00
  Filled 2015-07-16: qty 10

## 2015-07-16 MED ORDER — TRIHEXYPHENIDYL HCL 5 MG PO TABS
5.0000 mg | ORAL_TABLET | Freq: Every day | ORAL | Status: DC
  Filled 2015-07-16: qty 1
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
## 2015-07-16 NOTE — ED Notes (Signed)
Pt up at nurses station yelling, grandiose, manic behavior. Difficult to redirect. Special checks q 15 mins in place for safety. Video monitoring in place.

## 2015-07-16 NOTE — ED Notes (Signed)
Pt requesting to speak with social worker Lavonia. Social worker notified.

## 2015-07-16 NOTE — ED Notes (Signed)
[name] at facility to transfer pt to [facility] per MD order. Pt signed for personal belongings and personal belongings given to [name] for transport. IVC paperwork and EMTALA form given to [name] in folder for transfer.  Pt ambulatory off unit with [name].

## 2015-07-16 NOTE — ED Notes (Signed)
Pt verbally threatening towards nursing staff "Your ass is mine." [facility]. Loud, demanding. Will continue to monitor.

## 2015-11-10 ENCOUNTER — Encounter (HOSPITAL_COMMUNITY): Payer: Self-pay

## 2015-11-10 ENCOUNTER — Emergency Department (HOSPITAL_COMMUNITY)
Admission: EM | Admit: 2015-11-10 | Discharge: 2015-11-10 | Disposition: A | Discharge status: Home / Self Care | Payer: Medicaid Other | Attending: Emergency Medicine | Admitting: Emergency Medicine

## 2015-11-10 DIAGNOSIS — F909 Attention-deficit hyperactivity disorder, unspecified type: Secondary | ICD-10-CM | POA: Insufficient documentation

## 2015-11-10 DIAGNOSIS — L02413 Cutaneous abscess of right upper limb: Secondary | ICD-10-CM | POA: Diagnosis present

## 2015-11-10 DIAGNOSIS — F17210 Nicotine dependence, cigarettes, uncomplicated: Secondary | ICD-10-CM | POA: Insufficient documentation

## 2015-11-10 MED ORDER — ONDANSETRON 4 MG PO TBDP
4.0000 mg | ORAL_TABLET | Freq: Once | ORAL | Status: AC
  Administered 2015-11-10: 4 mg via ORAL
  Filled 2015-11-10: qty 1

## 2015-11-10 MED ORDER — NAPROXEN 250 MG PO TABS
500.0000 mg | ORAL_TABLET | Freq: Once | ORAL | Status: AC
  Administered 2015-11-10: 500 mg via ORAL
  Filled 2015-11-10: qty 2

## 2015-11-10 MED ORDER — SULFAMETHOXAZOLE-TRIMETHOPRIM 800-160 MG PO TABS: 1.0000 | ORAL_TABLET | Freq: Two times a day (BID) | ORAL | 0 refills | Status: AC

## 2015-11-10 MED ORDER — CEPHALEXIN 500 MG PO CAPS: 500.0000 mg | ORAL_CAPSULE | Freq: Three times a day (TID) | ORAL | 0 refills | Status: DC

## 2015-11-10 NOTE — ED Triage Notes (Signed)
Patient here with right forearm abscess with drainage x 10 days, states she has a few red places on buttocks but no drainage. Hx of same, NAD

## 2015-11-10 NOTE — Discharge Instructions (Signed)
Take antibiotics as prescribed. Apply warm soaks to the area on your right arm two to three times daily. Return to the ER for new or worsening symptoms.

## 2015-11-10 NOTE — ED Provider Notes (Signed)
[facility]-EMERGENCY DEPT Provider Note   CSN: [id number] Arrival date & time: 11/10/15  1042 By signing my name below, I, [name], attest that this documentation has been prepared under the direction and in the presence of Richanda Darin, [place]. Electronically Signed: [name], ED Scribe. 11/10/15. 11:08 AM.  History   Chief Complaint Chief Complaint  Patient presents with  . Abscess   HPI Comments: Betty Parrish is a 25 y.o. female who presents to the Emergency Department complaining of sudden onset, constant right forearm abscess onset 10 days ago. Mild drainage present. Pt also has associated nausea. Pt states she has a few red areas on her right flank as well but with no drainage. Pt has not taken anything for the pain PTA. She has h/o abscesses and notes she gets them every 5 years and that these are similar. Denies fever, chills, vomiting, or any other associated symptoms.    The history is provided by the patient. No language interpreter was used.    Past Medical History:  Diagnosis Date  . ADHD (attention deficit hyperactivity disorder)   . Anxiety   . Heart murmur   . Schizo-affective psychosis (HCC)   . Sickle cell trait [facility])     Patient Active Problem List   Diagnosis Date Noted  . Cannabis abuse   . Bipolar disorder, manic phase (HCC) 09/30/2014  . Schizoaffective disorder, bipolar type (HCC) 09/29/2014  . Mania (HCC) 12/18/2012    History reviewed. No pertinent surgical history.  OB History    No data available       Home Medications    Prior to Admission medications   Medication Sig Start Date End Date Taking? Authorizing Provider  ARIPiprazole (ABILIFY MAINTENA) 400 MG SUSR Inject 400 mg into the muscle every 30 (thirty) days.    Historical Provider, MD  ARIPiprazole (ABILIFY) 15 MG tablet Take 1 tablet (15 mg total) by mouth daily. Patient not taking: Reported on 07/14/2015 10/04/14   Beau Fanny, FNP  ARIPiprazole (ABILIFY) 5 MG tablet Take 5 mg by mouth  daily.    Historical Provider, MD  carbamazepine (TEGRETOL) 200 MG tablet Take 400 mg by mouth at bedtime.    Historical Provider, MD  divalproex (DEPAKOTE ER) 500 MG 24 hr tablet Take 1 tablet (500 mg total) by mouth 2 (two) times daily after a meal. 10/04/14   Beau Fanny, FNP  FLUoxetine (PROZAC) 10 MG capsule Take 10 mg by mouth daily.    Historical Provider, MD  hydrOXYzine (ATARAX/VISTARIL) 50 MG tablet Take 1 tablet (50 mg total) by mouth every 6 (six) hours as needed (mild anxiety). Patient taking differently: Take 50 mg by mouth every 6 (six) hours as needed (mild anxiety). For anxiety 10/04/14   Beau Fanny, FNP  ibuprofen (ADVIL,MOTRIN) 800 MG tablet Take 1 tablet (800 mg total) by mouth 3 (three) times daily. Patient not taking: Reported on 07/14/2015 04/20/15   Shawn C Joy, PA-C  menthol-cetylpyridinium (CEPACOL) 3 MG lozenge Take 1 lozenge (3 mg total) by mouth 3 (three) times daily as needed for sore throat. Patient not taking: Reported on 07/14/2015 10/04/14   Beau Fanny, FNP  traZODone (DESYREL) 150 MG tablet Take 1 tablet (150 mg total) by mouth at bedtime. 10/04/14   Beau Fanny, FNP    Family History Family History  Problem Relation Age of Onset  . Sickle cell anemia Mother     Social History Social History  Substance Use Topics  . Smoking  status: Current Every Day Smoker    Packs/day: 1.00    Types: Cigarettes  . Smokeless tobacco: Never Used  . Alcohol use Yes     Allergies   Other; Red dye; and Zyprexa [olanzapine]   Review of Systems Review of Systems 10 Systems reviewed and all are negative for acute change except as noted in the HPI. Physical Exam Updated Vital Signs BP 122/88 (BP Location: Left Arm)   Pulse 92   Temp 98.1 F (36.7 C) (Oral)   Resp 14   SpO2 98%   Physical Exam  Constitutional: She appears well-developed and well-nourished.  HENT:  Head: Normocephalic.  Eyes: Conjunctivae are normal.  Cardiovascular: Normal rate.     Pulmonary/Chest: Effort normal. No respiratory distress.  Abdominal: She exhibits no distension.  Musculoskeletal: Normal range of motion.  Neurological: She is alert.  Skin: Skin is warm and dry.  Right dorsal forearm with 1 cm area of edema that is tender, appears to have spontaneously draine. Scant purulence visible but none expressible. Lower right flank with three scatter papular lesions, non-tender, non-draining, not fluctuant.  Psychiatric: She has a normal mood and affect. Her behavior is normal.  Nursing note and vitals reviewed.    ED Treatments / Results  DIAGNOSTIC STUDIES: Oxygen Saturation is 98% on RA, normal by my interpretation.    COORDINATION OF CARE: 11:08 AM Discussed treatment plan with pt at bedside and pt agreed to plan.  Labs (all labs ordered are listed, but only abnormal results are displayed) Labs Reviewed - No data to display  EKG  EKG Interpretation None       Radiology No results found.  Procedures Procedures (including critical care time)  Medications Ordered in ED Medications - No data to display   Initial Impression / Assessment and Plan / ED Course  I have reviewed the triage vital signs and the nursing notes.  Pertinent labs & imaging results that were available during my care of the patient were reviewed by me and considered in my medical decision making (see chart for details).  Clinical Course    Patient with what appears to be an abscess on her right forearm that has already spontaneously drained. Area is now firm without fluctuance. The lesions on her back are papular at this time and small. Will cover with course of keflex and bactrim. Encouraged warm soaks. Supportive care and return precautions discussed.  Pt sent home. The patient appears reasonably screened and/or stabilized for discharge and I doubt any other emergent medical condition requiring further screening, evaluation, or treatment in the ED prior to  discharge.   Final Clinical Impressions(s) / ED Diagnoses   Final diagnoses:  Abscess of right arm     New Prescriptions New Prescriptions   CEPHALEXIN (KEFLEX) 500 MG CAPSULE    Take 1 capsule (500 mg total) by mouth 3 (three) times daily.   SULFAMETHOXAZOLE-TRIMETHOPRIM (BACTRIM DS,SEPTRA DS) 800-160 MG TABLET    Take 1 tablet by mouth 2 (two) times daily.   I personally performed the services described in this documentation, which was scribed in my presence. The recorded information has been reviewed and is accurate.    Carlene CoriaSerena Y Armonie Mettler, PA-C 11/10/15 1118    Doug SouSam Jacubowitz, MD 11/10/15 [id number]

## 2016-01-15 ENCOUNTER — Emergency Department (HOSPITAL_COMMUNITY)
Admission: EM | Admit: 2016-01-15 | Discharge: 2016-01-15 | Disposition: A | Discharge status: Home / Self Care | Payer: Medicaid Other | Source: Home / Self Care | Attending: Emergency Medicine | Admitting: Emergency Medicine

## 2016-01-15 ENCOUNTER — Emergency Department (HOSPITAL_COMMUNITY)
Admission: EM | Admit: 2016-01-15 | Discharge: 2016-01-16 | Disposition: A | Discharge status: Other Acute Inpatient Hospital | Payer: Medicaid Other | Attending: Emergency Medicine | Admitting: Emergency Medicine

## 2016-01-15 ENCOUNTER — Encounter (HOSPITAL_COMMUNITY): Payer: Self-pay | Admitting: Emergency Medicine

## 2016-01-15 DIAGNOSIS — F17210 Nicotine dependence, cigarettes, uncomplicated: Secondary | ICD-10-CM | POA: Insufficient documentation

## 2016-01-15 DIAGNOSIS — R451 Restlessness and agitation: Secondary | ICD-10-CM | POA: Diagnosis present

## 2016-01-15 DIAGNOSIS — F419 Anxiety disorder, unspecified: Secondary | ICD-10-CM | POA: Insufficient documentation

## 2016-01-15 DIAGNOSIS — F909 Attention-deficit hyperactivity disorder, unspecified type: Secondary | ICD-10-CM | POA: Insufficient documentation

## 2016-01-15 DIAGNOSIS — F250 Schizoaffective disorder, bipolar type: Secondary | ICD-10-CM | POA: Diagnosis present

## 2016-01-15 DIAGNOSIS — Z79899 Other long term (current) drug therapy: Secondary | ICD-10-CM | POA: Diagnosis not present

## 2016-01-15 DIAGNOSIS — R4689 Other symptoms and signs involving appearance and behavior: Secondary | ICD-10-CM

## 2016-01-15 DIAGNOSIS — Z8489 Family history of other specified conditions: Secondary | ICD-10-CM | POA: Diagnosis not present

## 2016-01-15 LAB — COMPREHENSIVE METABOLIC PANEL
ALT: 26 U/L (ref 14–54)
ANION GAP: 9 (ref 5–15)
AST: 26 U/L (ref 15–41)
Albumin: 4.6 g/dL (ref 3.5–5.0)
Alkaline Phosphatase: 48 U/L (ref 38–126)
BILIRUBIN TOTAL: 1.6 mg/dL — AB (ref 0.3–1.2)
BUN: 5 mg/dL — AB (ref 6–20)
CO2: 23 mmol/L (ref 22–32)
Calcium: 9.6 mg/dL (ref 8.9–10.3)
Chloride: 105 mmol/L (ref 101–111)
Creatinine, Ser: 0.6 mg/dL (ref 0.44–1.00)
Glucose, Bld: 113 mg/dL — ABNORMAL HIGH (ref 65–99)
POTASSIUM: 3.1 mmol/L — AB (ref 3.5–5.1)
Sodium: 137 mmol/L (ref 135–145)
TOTAL PROTEIN: 7.5 g/dL (ref 6.5–8.1)

## 2016-01-15 LAB — RAPID URINE DRUG SCREEN, HOSP PERFORMED
Amphetamines: NOT DETECTED
BENZODIAZEPINES: NOT DETECTED
Barbiturates: NOT DETECTED
Cocaine: NOT DETECTED
OPIATES: NOT DETECTED
Tetrahydrocannabinol: POSITIVE — AB

## 2016-01-15 LAB — ACETAMINOPHEN LEVEL

## 2016-01-15 LAB — CBC
HCT: 37.5 % (ref 36.0–46.0)
Hemoglobin: 13.5 g/dL (ref 12.0–15.0)
MCH: 31.5 pg (ref 26.0–34.0)
MCHC: 36 g/dL (ref 30.0–36.0)
MCV: 87.4 fL (ref 78.0–100.0)
PLATELETS: 218 10*3/uL (ref 150–400)
RBC: 4.29 MIL/uL (ref 3.87–5.11)
RDW: 12.9 % (ref 11.5–15.5)
WBC: 7.8 10*3/uL (ref 4.0–10.5)

## 2016-01-15 LAB — POC URINE PREG, ED: PREG TEST UR: NEGATIVE

## 2016-01-15 LAB — ETHANOL

## 2016-01-15 LAB — SALICYLATE LEVEL

## 2016-01-15 MED ORDER — LORAZEPAM 2 MG/ML IJ SOLN
2.0000 mg | Freq: Once | INTRAMUSCULAR | Status: AC
  Administered 2016-01-15: 2 mg via INTRAMUSCULAR
  Filled 2016-01-15: qty 1

## 2016-01-15 MED ORDER — HYDROXYZINE HCL 25 MG PO TABS
50.0000 mg | ORAL_TABLET | Freq: Four times a day (QID) | ORAL | Status: DC | PRN
  Administered 2016-01-16: 50 mg via ORAL
  Filled 2016-01-15: qty 2

## 2016-01-15 MED ORDER — DIPHENHYDRAMINE HCL 50 MG/ML IJ SOLN
25.0000 mg | Freq: Once | INTRAMUSCULAR | Status: AC
  Administered 2016-01-15: 25 mg via INTRAMUSCULAR
  Filled 2016-01-15: qty 1

## 2016-01-15 MED ORDER — TRAZODONE HCL 50 MG PO TABS
150.0000 mg | ORAL_TABLET | Freq: Every day | ORAL | Status: DC
  Administered 2016-01-15: 150 mg via ORAL
  Filled 2016-01-15: qty 1

## 2016-01-15 MED ORDER — POTASSIUM CHLORIDE CRYS ER 20 MEQ PO TBCR
40.0000 meq | EXTENDED_RELEASE_TABLET | Freq: Once | ORAL | Status: AC
  Administered 2016-01-15: 40 meq via ORAL
  Filled 2016-01-15: qty 2

## 2016-01-15 MED ORDER — HALOPERIDOL LACTATE 5 MG/ML IJ SOLN
5.0000 mg | Freq: Once | INTRAMUSCULAR | Status: DC
  Filled 2016-01-15: qty 1

## 2016-01-15 MED ORDER — HALOPERIDOL LACTATE 5 MG/ML IJ SOLN
2.0000 mg | Freq: Once | INTRAMUSCULAR | Status: AC
  Administered 2016-01-15: 2 mg via INTRAMUSCULAR

## 2016-01-15 MED ORDER — FLUOXETINE HCL 10 MG PO CAPS
10.0000 mg | ORAL_CAPSULE | Freq: Every day | ORAL | Status: DC
  Administered 2016-01-15 – 2016-01-16 (×2): 10 mg via ORAL
  Filled 2016-01-15 (×2): qty 1

## 2016-01-15 MED ORDER — CARBAMAZEPINE 200 MG PO TABS
400.0000 mg | ORAL_TABLET | Freq: Every day | ORAL | Status: DC
  Administered 2016-01-15: 400 mg via ORAL
  Filled 2016-01-15: qty 2

## 2016-01-15 MED ORDER — DIVALPROEX SODIUM ER 500 MG PO TB24
500.0000 mg | ORAL_TABLET | Freq: Two times a day (BID) | ORAL | Status: DC
  Administered 2016-01-15 – 2016-01-16 (×2): 500 mg via ORAL
  Filled 2016-01-15 (×2): qty 1
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
## 2016-01-15 NOTE — ED Notes (Signed)
Patient presents with flat affect and depressed. Patient showered. Patient denies SI/HI and AVH. Patient reports she came to the hospital because she "cussed out my mama" and that her mother says "my behavior is different, I am not myself". Patient with no questions or concerns at this time. Patient on Q15 minute checks. Environment is secured and patient remains safe on the unit at this time.

## 2016-01-15 NOTE — ED Notes (Signed)
Bed: [id number] Expected date:  Expected time:  Means of arrival:  Comments: crack

## 2016-01-15 NOTE — ED Notes (Signed)
Dr.Wickline at bedside with pt along with Joy,NT

## 2016-01-15 NOTE — ED Provider Notes (Signed)
[facility]-EMERGENCY DEPT Provider Note   CSN: [id number] Arrival date & time: 01/15/16  0113     History   Chief Complaint Chief Complaint  Patient presents with  . Drug Problem    HPI Betty Parrish is a 25 y.o. female.  The history is provided by the patient.  Drug Problem  This is a new problem. The problem has been gradually improving. Nothing aggravates the symptoms. Nothing relieves the symptoms.   Patient apparently presents for "drug problem" On arrival to the ER, she reported that she did multiple drugs and felt weak Apparently she was agitated for EMS and was given haldol After waking up in the ER, she became belligerent and requested discharge Past Medical History:  Diagnosis Date  . ADHD (attention deficit hyperactivity disorder)   . Anxiety   . Heart murmur   . Schizo-affective psychosis (HCC)   . Sickle cell trait [facility](HCC)     Patient Active Problem List   Diagnosis Date Noted  . Cannabis abuse   . Bipolar disorder, manic phase (HCC) 09/30/2014  . Schizoaffective disorder, bipolar type (HCC) 09/29/2014  . Mania (HCC) 12/18/2012    No past surgical history on file.  OB History    No data available       Home Medications    Prior to Admission medications   Medication Sig Start Date End Date Taking? Authorizing Provider  ARIPiprazole (ABILIFY MAINTENA) 400 MG SUSR Inject 400 mg into the muscle every 30 (thirty) days.    Historical Provider, MD  ARIPiprazole (ABILIFY) 15 MG tablet Take 1 tablet (15 mg total) by mouth daily. Patient not taking: Reported on 07/14/2015 10/04/14   Beau FannyJohn C Withrow, FNP  ARIPiprazole (ABILIFY) 5 MG tablet Take 5 mg by mouth daily.    Historical Provider, MD  carbamazepine (TEGRETOL) 200 MG tablet Take 400 mg by mouth at bedtime.    Historical Provider, MD  cephALEXin (KEFLEX) 500 MG capsule Take 1 capsule (500 mg total) by mouth 3 (three) times daily. 11/10/15   Ace GinsSerena Y Sam, PA-C  divalproex (DEPAKOTE ER) 500 MG 24 hr tablet Take 1  tablet (500 mg total) by mouth 2 (two) times daily after a meal. 10/04/14   Beau FannyJohn C Withrow, FNP  FLUoxetine (PROZAC) 10 MG capsule Take 10 mg by mouth daily.    Historical Provider, MD  hydrOXYzine (ATARAX/VISTARIL) 50 MG tablet Take 1 tablet (50 mg total) by mouth every 6 (six) hours as needed (mild anxiety). Patient taking differently: Take 50 mg by mouth every 6 (six) hours as needed (mild anxiety). For anxiety 10/04/14   Beau FannyJohn C Withrow, FNP  ibuprofen (ADVIL,MOTRIN) 800 MG tablet Take 1 tablet (800 mg total) by mouth 3 (three) times daily. Patient not taking: Reported on 07/14/2015 04/20/15   Shawn C Joy, PA-C  menthol-cetylpyridinium (CEPACOL) 3 MG lozenge Take 1 lozenge (3 mg total) by mouth 3 (three) times daily as needed for sore throat. Patient not taking: Reported on 07/14/2015 10/04/14   Beau FannyJohn C Withrow, FNP  traZODone (DESYREL) 150 MG tablet Take 1 tablet (150 mg total) by mouth at bedtime. 10/04/14   Beau FannyJohn C Withrow, FNP    Family History Family History  Problem Relation Age of Onset  . Sickle cell anemia Mother     Social History Social History  Substance Use Topics  . Smoking status: Current Every Day Smoker    Packs/day: 1.00    Types: Cigarettes  . Smokeless tobacco: Never Used  . Alcohol use Yes  Allergies   Other; Red dye; and Zyprexa [olanzapine]   Review of Systems Review of Systems  Constitutional: Positive for fatigue.  Psychiatric/Behavioral: Positive for agitation.     Physical Exam Updated Vital Signs BP 124/84   Pulse 98   Resp 17   SpO2 98%   Physical Exam CONSTITUTIONAL: Disheveled, anxious HEAD: Normocephalic EYES: EOMI ENMT: Mucous membranes moist NECK: supple no meningeal signs LUNGS: no apparent distress NEURO: Pt is awake/alert/appropriate, moves all extremitiesx4.  No facial droop.  No ataxia.  No focal weakness noted EXTREMITIES:  full ROM SKIN: warm, color normal PSYCH: anxious and agitated   ED Treatments / Results  Labs (all  labs ordered are listed, but only abnormal results are displayed) Labs Reviewed - No data to display  EKG  EKG Interpretation  Date/Time:  Tuesday January 15 2016 01:20:04 EST Ventricular Rate:  117 PR Interval:    QRS Duration: 79 QT Interval:  438 QTC Calculation: 612 R Axis:   25 Text Interpretation:  Sinus tachycardia Ventricular premature complex Borderline repolarization abnormality Interpretation limited secondary to artifact Confirmed by Bebe ShaggyWICKLINE  MD, Kamree Wiens ([id number]) on 01/15/2016 1:50:36 AM       Radiology No results found.  Procedures Procedures (including critical care time)  Medications Ordered in ED Medications - No data to display   Initial Impression / Assessment and Plan / ED Course  I have reviewed the triage vital signs and the nursing notes.   Clinical Course     Pt initially came to ED for drug abuse Pt apparently fell asleep while in the ED and when woke up she demanded discharge She was verbally abusive to staff cursing and yelling However, she was directable, and when calm she requested d/c home.  She does not appear psychotic at this time She is awake/alert, ambulatory with steady gait At this point, I don't have any grounds to hold her in the ER Advised to f/u with her physician  I was chaperoned by nursing staff during entire ED evaluation.    Final Clinical Impressions(s) / ED Diagnoses   Final diagnoses:  Anxiety    New Prescriptions Discharge Medication List as of 01/15/2016  4:33 AM       Zadie Rhineonald Darnella Zeiter, MD 01/15/16 [phone]

## 2016-01-15 NOTE — ED Notes (Signed)
Patient is breathing and ok but refused to wake up for writer to obtain vitals of patient. Rn Britta MccreedyBarbara was made aware and will try to obtain vitals when patient wake up.

## 2016-01-15 NOTE — ED Notes (Signed)
Pt yelling and cursing to self and at staff. Pt stating that she does not want to stay and repeatedly removed her gown. MD to be notified.

## 2016-01-15 NOTE — ED Notes (Signed)
Pt screaming and yelling in room. Shouting "Fuck Y'all".

## 2016-01-15 NOTE — ED Notes (Signed)
Per EMS- Pt smoked weed "fruity pebbles", an hour ago. Feels as if her "heart is open". Pt yelling and undoing buckles on EMS, given 5mg  haldol.

## 2016-01-15 NOTE — Progress Notes (Signed)
Discussed current medication regimen with patient. Patient states she was previously on Abilify but states she has not been on her medications. She did not give a time frame how long she has been off her medication. She states she was on Abilify injection at one point. She is currently followed by [facility] act [facility]. This practitioner called [facility] at [phone] to obtain current medications for this patient. A message was left with a live attendant for the crisis team worker to return the call.  Alberteen SamFran Greenleigh Kauth, FNP-BC [facility] 01/15/2016         5:50 PM

## 2016-01-15 NOTE — ED Notes (Signed)
[facility] Expected date: 01/15/16 Expected time:  Means of arrival:  Comments: 1032

## 2016-01-15 NOTE — ED Provider Notes (Signed)
[facility]-EMERGENCY DEPT Provider Note   CSN: [id number] Arrival date & time: 01/15/16  1012     History   Chief Complaint Chief Complaint  Patient presents with  . IVC  . Manic Behavior    HPI Betty Parrish is a 25 y.o. female.  HPI 25 year old female with past medical history of schizoaffective disease who presents with agitation. Patient is markedly agitated, limiting history. Currently, she states that she did cocaine several days ago and has not slept since then. She denies any history of cocaine use and states that it has "set her off." Denies any other drug use. She endorses active suicidal ideation as well as homicidal ideation and is actively threatening to me during the interview. Patient slamming her fists against the wall during interview.  Level 5 caveat invoked as remainder of history, ROS, and physical exam limited due to patient's agitation, AMS.   Past Medical History:  Diagnosis Date  . ADHD (attention deficit hyperactivity disorder)   . Anxiety   . Heart murmur   . Schizo-affective psychosis (HCC)   . Sickle cell trait [facility](HCC)     Patient Active Problem List   Diagnosis Date Noted  . Cannabis abuse   . Bipolar disorder, manic phase (HCC) 09/30/2014  . Schizoaffective disorder, bipolar type (HCC) 09/29/2014  . Mania (HCC) 12/18/2012    History reviewed. No pertinent surgical history.  OB History    No data available       Home Medications    Prior to Admission medications   Medication Sig Start Date End Date Taking? Authorizing Provider  ARIPiprazole (ABILIFY MAINTENA) 400 MG SUSR Inject 400 mg into the muscle every 30 (thirty) days.   Yes Historical Provider, MD  ARIPiprazole (ABILIFY) 15 MG tablet Take 1 tablet (15 mg total) by mouth daily. Patient not taking: Reported on 07/14/2015 10/04/14   Beau FannyJohn C Withrow, FNP  ARIPiprazole (ABILIFY) 5 MG tablet Take 5 mg by mouth daily.    Historical Provider, MD  carbamazepine (TEGRETOL) 200 MG tablet Take 400 mg  by mouth at bedtime.    Historical Provider, MD  cephALEXin (KEFLEX) 500 MG capsule Take 1 capsule (500 mg total) by mouth 3 (three) times daily. 11/10/15   Ace GinsSerena Y Sam, PA-C  divalproex (DEPAKOTE ER) 500 MG 24 hr tablet Take 1 tablet (500 mg total) by mouth 2 (two) times daily after a meal. 10/04/14   Beau FannyJohn C Withrow, FNP  FLUoxetine (PROZAC) 10 MG capsule Take 10 mg by mouth daily.    Historical Provider, MD  hydrOXYzine (ATARAX/VISTARIL) 50 MG tablet Take 1 tablet (50 mg total) by mouth every 6 (six) hours as needed (mild anxiety). Patient taking differently: Take 50 mg by mouth every 6 (six) hours as needed (mild anxiety). For anxiety 10/04/14   Beau FannyJohn C Withrow, FNP  ibuprofen (ADVIL,MOTRIN) 800 MG tablet Take 1 tablet (800 mg total) by mouth 3 (three) times daily. Patient not taking: Reported on 07/14/2015 04/20/15   Shawn C Joy, PA-C  menthol-cetylpyridinium (CEPACOL) 3 MG lozenge Take 1 lozenge (3 mg total) by mouth 3 (three) times daily as needed for sore throat. Patient not taking: Reported on 07/14/2015 10/04/14   Beau FannyJohn C Withrow, FNP  traZODone (DESYREL) 150 MG tablet Take 1 tablet (150 mg total) by mouth at bedtime. 10/04/14   Beau FannyJohn C Withrow, FNP    Family History Family History  Problem Relation Age of Onset  . Sickle cell anemia Mother     Social History Social History  Substance Use Topics  . Smoking status: Current Every Day Smoker    Packs/day: 1.00    Types: Cigarettes  . Smokeless tobacco: Never Used  . Alcohol use Yes     Allergies   Other; Red dye; and Zyprexa [olanzapine]   Review of Systems Review of Systems  Unable to perform ROS: Psychiatric disorder  All other systems reviewed and are negative.    Physical Exam Updated Vital Signs BP 105/57 (BP Location: Left Arm)   Pulse 78   Temp 99.6 F (37.6 C) (Oral)   Resp 16   SpO2 98%   Physical Exam  Constitutional: She is oriented to person, place, and time. She appears well-developed and well-nourished. She  appears distressed.  HENT:  Head: Normocephalic and atraumatic.  Eyes: Conjunctivae are normal.  Neck: Neck supple.  Cardiovascular: Normal rate, regular rhythm and normal heart sounds.  Exam reveals no friction rub.   No murmur heard. Pulmonary/Chest: Effort normal and breath sounds normal. No respiratory distress. She has no wheezes. She has no rales.  Abdominal: She exhibits no distension.  Musculoskeletal: She exhibits no edema.  Neurological: She is alert and oriented to person, place, and time. She exhibits normal muscle tone.  Skin: Skin is warm. Capillary refill takes less than 2 seconds.  Psychiatric: Her affect is angry, labile and inappropriate. Her speech is rapid and/or pressured. She is agitated and aggressive. Thought content is paranoid. Cognition and memory are impaired. She expresses impulsivity and inappropriate judgment.  Nursing note and vitals reviewed.    ED Treatments / Results  Labs (all labs ordered are listed, but only abnormal results are displayed) Labs Reviewed  COMPREHENSIVE METABOLIC PANEL - Abnormal; Notable for the following:       Result Value   Potassium 3.1 (*)    Glucose, Bld 113 (*)    BUN 5 (*)    Total Bilirubin 1.6 (*)    All other components within normal limits  ACETAMINOPHEN LEVEL - Abnormal; Notable for the following:    Acetaminophen (Tylenol), Serum <10 (*)    All other components within normal limits  RAPID URINE DRUG SCREEN, HOSP PERFORMED - Abnormal; Notable for the following:    Tetrahydrocannabinol POSITIVE (*)    All other components within normal limits  ETHANOL  SALICYLATE LEVEL  CBC  VALPROIC ACID LEVEL  POC URINE PREG, ED    EKG  EKG Interpretation None       Radiology No results found.  Procedures .Critical Care Performed by: Shaune Pollack Authorized by: Shaune Pollack   Critical care provider statement:    Critical care time (minutes):  35   Critical care time was exclusive of:  Separately  billable procedures and treating other patients   Critical care was necessary to treat or prevent imminent or life-threatening deterioration of the following conditions:  Toxidrome   Critical care was time spent personally by me on the following activities:  Discussions with consultants, development of treatment plan with patient or surrogate, evaluation of patient's response to treatment, examination of patient, obtaining history from patient or surrogate, ordering and performing treatments and interventions, ordering and review of laboratory studies, ordering and review of radiographic studies, pulse oximetry, re-evaluation of patient's condition and review of old charts   (including critical care time)  Medications Ordered in ED Medications  traZODone (DESYREL) tablet 150 mg (not administered)  hydrOXYzine (ATARAX/VISTARIL) tablet 50 mg (not administered)  FLUoxetine (PROZAC) capsule 10 mg (not administered)  divalproex (DEPAKOTE ER) 24 hr  tablet 500 mg (not administered)  carbamazepine (TEGRETOL) tablet 400 mg (not administered)  LORazepam (ATIVAN) injection 2 mg (2 mg Intramuscular Given 01/15/16 1050)  diphenhydrAMINE (BENADRYL) injection 25 mg (25 mg Intramuscular Given 01/15/16 1050)  haloperidol lactate (HALDOL) injection 2 mg (2 mg Intramuscular Given 01/15/16 1051)  potassium chloride SA (K-DUR,KLOR-CON) CR tablet 40 mEq (40 mEq Oral Given 01/15/16 1342)     Initial Impression / Assessment and Plan / ED Course  I have reviewed the triage vital signs and the nursing notes.  Pertinent labs & imaging results that were available during my care of the patient were reviewed by me and considered in my medical decision making (see chart for details).  Clinical Course     25 year old female with past medical history of schizoaffective disorder here with acute agitation. On arrival, patient is notably aggressive, threatening to myself and staff, slamming doors in her room as well as hitting  the wall. Patient poses significant risk of dangerousness to myself, others, as well as herself. Patient unable to be calmed with verbal reassurance, redirection, and nonpharmacologic methods. IVC subsequently placed and patient sedated with IM Haldol, Ativan, and Benadryl. Patient resting comfortably after sedation. No physical restraints required following sedation. Patient labs are otherwise overall unremarkable. Next  Patient remains hemodynamically stable after IM sedation. TTS consulted for likely acute on chronic psychiatric decompensation versus substance induced psychosis.  Final Clinical Impressions(s) / ED Diagnoses   Final diagnoses:  Schizoaffective disorder, bipolar type [facility](HCC)  Aggressiveness    New Prescriptions New Prescriptions   No medications on file     Shaune Pollackameron Jaritza Duignan, MD 01/15/16 1916

## 2016-01-15 NOTE — ED Notes (Signed)
Pt is now calmly resting. Sitter states will obtain urine specimen when pt is cooperative.

## 2016-01-15 NOTE — ED Notes (Signed)
Patient arrived tearful hugging nurse asking for mother.  Began screaming and slamming door and being disruptive beating on the walls.  Dr. Penne LashIssacs back to assess patient.  Medications ordered for sedation.  Meds given IM with charge nurse.

## 2016-01-15 NOTE — BH Assessment (Signed)
Assessment Note  Betty Parrish is an 25 y.o. female that denies SI/HI/Psychosis/Substance Abuse.   Patient was manic, uncooperative and agitated during the assessment.  Patient was IVC by the ER MD due to the patient  threatenign the MD.    Documentation in the epic chart reports that the patient stated that she was suicidal and homicidal.  Patient denies having a plan to harm herself or anyone else.  Patient reports that she lives with her mother and her daughter.    Patient reports that she did use cocaine several days ago and her heart is still racing.  Patient reports that this was the first time using  Cocaine.     Diagnosis:  Schizoaffective Disorder    Past Medical History:  Past Medical History:  Diagnosis Date  . ADHD (attention deficit hyperactivity disorder)   . Anxiety   . Heart murmur   . Schizo-affective psychosis (HCC)   . Sickle cell trait (HCC)     History reviewed. No pertinent surgical history.  Family History:  Family History  Problem Relation Age of Onset  . Sickle cell anemia Mother     Social History:  reports that she has been smoking Cigarettes.  She has been smoking about 1.00 pack per day. She has never used smokeless tobacco. She reports that she drinks alcohol. She reports that she uses drugs, including Marijuana and Cocaine.  Additional Social History:  Alcohol / Drug Use History of alcohol / drug use?: Yes Negative Consequences of Use: Financial, Personal relationships, Work / Programmer, multimedia Withdrawal Symptoms:  (None Reported) Substance #1 Name of Substance 1: Marijuana  1 - Age of First Use: 17 1 - Amount (size/oz): Varies  1 - Frequency: Varies 1 - Duration: 5 YRS 1 - Last Use / Amount: Yesterday - patient does not remember how much she used  CIWA: CIWA-Ar BP: 105/57 Pulse Rate: 78 COWS:    Allergies:  Allergies  Allergen Reactions  . Other Hives    Hawaiian punch - red dye  . Red Dye Other (See Comments)    Listed on paperwork  .  Zyprexa [Olanzapine] Other (See Comments)    Reaction is unknown  " jerking "     Home Medications:  (Not in a hospital admission)  OB/GYN Status:  No LMP recorded.  General Assessment Data Location of Assessment: [facility] ED TTS Assessment: In system Is this a Tele or Face-to-Face Assessment?: Face-to-Face Is this an Initial Assessment or a Re-assessment for this encounter?: Initial Assessment Marital status: Single Maiden name: NA Is patient pregnant?: No Pregnancy Status: No Living Arrangements: Children Can pt return to current living arrangement?: Yes Admission Status: Involuntary Is patient capable of signing voluntary admission?: No Referral Source: Self/Family/Friend Insurance type: Medicaid     Crisis Care Plan Living Arrangements: Children Name of Psychiatrist: [facility] Name of Therapist: [facility]   Education Status Is patient currently in school?: No Current Grade: NA Highest grade of school patient has completed: NA Name of school: NA Contact person: NA  Risk to self with the past 6 months Suicidal Ideation: No Has patient been a risk to self within the past 6 months prior to admission? : No Suicidal Intent: No Has patient had any suicidal intent within the past 6 months prior to admission? : No Is patient at risk for suicide?: No Suicidal Plan?: No Has patient had any suicidal plan within the past 6 months prior to admission? : No Access to Means: No What has been  your use of drugs/alcohol within the last 12 months?: Marijuana and Cocailne Previous Attempts/Gestures: No How many times?: 0 Other Self Harm Risks: None Reported Triggers for Past Attempts: None known Intentional Self Injurious Behavior: None Family Suicide History: No Recent stressful life event(s): Other (Comment) (Not taking her medication ) Persecutory voices/beliefs?: No Depression: Yes Depression Symptoms: Despondent, Insomnia, Loss of interest in usual  pleasures, Feeling worthless/self pity Substance abuse history and/or treatment for substance abuse?: No Suicide prevention information given to non-admitted patients: Not applicable  Risk to Others within the past 6 months Homicidal Ideation: No Does patient have any lifetime risk of violence toward others beyond the six months prior to admission? : No Thoughts of Harm to Others: No Current Homicidal Intent: No Current Homicidal Plan: No Access to Homicidal Means: No Identified Victim: NA History of harm to others?: No Assessment of Violence: None Noted Violent Behavior Description: None Reported Does patient have access to weapons?: No Criminal Charges Pending?: No Does patient have a court date: No Is patient on probation?: No  Psychosis Hallucinations: None noted Delusions: None noted  Mental Status Report Appearance/Hygiene: Disheveled Eye Contact: Poor Motor Activity: Agitation Speech: Logical/coherent Level of Consciousness: Alert Mood: Depressed, Anxious, Suspicious Affect: Anxious, Depressed, Blunted Anxiety Level: Minimal Thought Processes: Coherent, Relevant Judgement: Impaired Orientation: Person, Place, Time, Situation Obsessive Compulsive Thoughts/Behaviors: None  Cognitive Functioning Concentration: Decreased Memory: Recent Intact, Remote Intact IQ: Average Insight: Fair Impulse Control: Poor Appetite: Fair Weight Loss: 0 Weight Gain: 0 Sleep: Decreased Total Hours of Sleep: 4 Vegetative Symptoms: None  ADLScreening [facility]) Patient's cognitive ability adequate to safely complete daily activities?: Yes Patient able to express need for assistance with ADLs?: No Independently performs ADLs?: Yes (appropriate for developmental age)  Prior Inpatient Therapy Prior Inpatient Therapy: No Prior Therapy Dates: NA Prior Therapy Facilty/Provider(s): NA Reason for Treatment: NA  Prior Outpatient Therapy Prior Outpatient Therapy:  Yes Prior Therapy Dates: Ongoing  Prior Therapy Facilty/Provider(s): [facility] Reason for Treatment: ACTT Services  Does patient have an ACCT team?: Yes Does patient have Intensive In-House Services?  : No Does patient have [facility] services? : No Does patient have P4[facility] services?: No  ADL Screening (condition at time of admission) Patient's cognitive ability adequate to safely complete daily activities?: Yes Is the patient deaf or have difficulty hearing?: No Does the patient have difficulty seeing, even when wearing glasses/contacts?: No Does the patient have difficulty concentrating, remembering, or making decisions?: Yes Patient able to express need for assistance with ADLs?: No Does the patient have difficulty dressing or bathing?: No Independently performs ADLs?: Yes (appropriate for developmental age) Does the patient have difficulty walking or climbing stairs?: No Weakness of Legs: None Weakness of Arms/Hands: None       Abuse/Neglect Assessment (Assessment to be complete while patient is alone) Physical Abuse: Denies Verbal Abuse: Denies Sexual Abuse: Denies Exploitation of patient/patient's resources: Denies Self-Neglect: Denies Values / Beliefs Cultural Requests During Hospitalization: None Spiritual Requests During Hospitalization: None Consults Spiritual Care Consult Needed: No Social Work Consult Needed: No Merchant navy officerAdvance Directives (For Healthcare) Does patient have an advance directive?: No Would patient like information on creating an advanced directive?: No - patient declined information    Additional Information 1:1 In Past 12 Months?: No CIRT Risk: No Elopement Risk: No Does patient have medical clearance?: Yes     Disposition: Pending psych disposition on the morning.  Disposition Initial Assessment Completed for this Encounter: Yes Disposition of Patient:  (Pending psych disposiotn in  the morning )  On Site Evaluation by:   Reviewed  with Physician:    Phillip HealStevenson, Yarethzi Branan [place] 01/15/2016 8:42 PM

## 2016-01-16 DIAGNOSIS — Z8489 Family history of other specified conditions: Secondary | ICD-10-CM | POA: Diagnosis not present

## 2016-01-16 DIAGNOSIS — F17210 Nicotine dependence, cigarettes, uncomplicated: Secondary | ICD-10-CM | POA: Diagnosis not present

## 2016-01-16 DIAGNOSIS — F250 Schizoaffective disorder, bipolar type: Secondary | ICD-10-CM

## 2016-01-16 DIAGNOSIS — Z79899 Other long term (current) drug therapy: Secondary | ICD-10-CM

## 2016-01-16 MED ORDER — QUETIAPINE FUMARATE 100 MG PO TABS: 100.0000 mg | ORAL_TABLET | Freq: Every day | ORAL | Status: DC

## 2016-01-16 MED ORDER — TRAZODONE HCL 100 MG PO TABS: 100.0000 mg | ORAL_TABLET | Freq: Every evening | ORAL | Status: DC | PRN

## 2016-01-16 MED ORDER — OXCARBAZEPINE 300 MG PO TABS
300.0000 mg | ORAL_TABLET | Freq: Two times a day (BID) | ORAL | Status: DC
  Administered 2016-01-16: 300 mg via ORAL
  Filled 2016-01-16: qty 1

## 2016-01-16 MED ORDER — ASENAPINE MALEATE 5 MG SL SUBL: 10.0000 mg | SUBLINGUAL_TABLET | Freq: Three times a day (TID) | SUBLINGUAL | Status: DC | PRN

## 2016-01-16 NOTE — BH Assessment (Signed)
[facility] Assessment Progress Note  Per Thedore MinsMojeed Akintayo, MD, this pt requires psychiatric hospitalization at this time.  The following facilities have been contacted to seek placement for this pt, with results as noted:  Beds available, information sent, decision pending:  [facility]   [facility]   Doylene Canninghomas Judi Jaffe, [place] Triage Specialist [phone]

## 2016-01-16 NOTE — ED Notes (Signed)
Pt has spent most of the day hyper verbal, being very needy asking staff for water, snacks and repeatedly asking when is she leaving. Threatened if she isn't discharged that "it's not gonna be pretty." At times apologetic and other times angry and stating that she does not like to be ignored. Given Hydroxyzine 50mg  po at 0910 for agitation. Pt calmed down somewhat but continues to be demanding, cursing on the phone and being loud and belligerent. Denies SI/HI/AVH at this time. Took all meds without incident.

## 2016-01-16 NOTE — Consult Note (Signed)
[facility] Consult   Reason for Consult:  Mania  Referring Physician:  EDP Patient Identification: Betty Parrish MRN:  [id number] Principal Diagnosis: Schizoaffective disorder, bipolar type [facility]) Diagnosis:   Patient Active Problem List   Diagnosis Date Noted  . Schizoaffective disorder, bipolar type ([place]) [F25.0] 09/29/2014    Priority: High  . Mania ([place]) [F30.9] 12/18/2012    Priority: High  . Cannabis abuse [F12.10]   . Bipolar disorder, manic phase ([place]) [F31.10] 09/30/2014    Total Time spent with patient: 45 minutes  Subjective:   Betty Parrish is a 25 y.o. female patient reports "hold my anger, when I can't hold it in, I explode.".  HPI:  25 yo female presented to the ED in mania.  Remains hyperverbal, tangential, pressured speech, labile mood, hyperactive.  She reports taking an Abilify injection, 2-3 weeks ago from her ACT team Licensed conveyancer, Lars Masson).  Not sleeping "at all" until last night.  She reports she is upset that the father of her 68 yo is trying to take her.  Inpatient hospitalization needed for stabilization  Past Psychiatric History: schizoaffective disorder  Risk to Self: Suicidal Ideation: No Suicidal Intent: No Is patient at risk for suicide?: No Suicidal Plan?: No Access to Means: No What has been your use of drugs/alcohol within the last 12 months?: Marijuana and Cocailne How many times?: 0 Other Self Harm Risks: None Reported Triggers for Past Attempts: None known Intentional Self Injurious Behavior: None Risk to Others: Homicidal Ideation: No Thoughts of Harm to Others: No Current Homicidal Intent: No Current Homicidal Plan: No Access to Homicidal Means: No Identified Victim: NA History of harm to others?: No Assessment of Violence: None Noted Violent Behavior Description: None Reported Does patient have access to weapons?: No Criminal Charges Pending?: No Does patient have a court date: No Prior Inpatient Therapy: Prior  Inpatient Therapy: No Prior Therapy Dates: NA Prior Therapy Facilty/Provider(s): NA Reason for Treatment: NA Prior Outpatient Therapy: Prior Outpatient Therapy: Yes Prior Therapy Dates: Ongoing  Prior Therapy Facilty/Provider(s): [facility] Reason for Treatment: ACTT Services  Does patient have an ACCT team?: Yes Does patient have Intensive In-House Services?  : No Does patient have [facility] services? : No Does patient have P4CC services?: No  Past Medical History:  Past Medical History:  Diagnosis Date  . ADHD (attention deficit hyperactivity disorder)   . Anxiety   . Heart murmur   . Schizo-affective psychosis ([place])   . Sickle cell trait ([place])    History reviewed. No pertinent surgical history. Family History:  Family History  Problem Relation Age of Onset  . Sickle cell anemia Mother    Family Psychiatric  History: none Social History:  History  Alcohol Use  . Yes     History  Drug Use  . Types: Marijuana, Cocaine    Social History   Social History  . Marital status: Single    Spouse name: N/A  . Number of children: N/A  . Years of education: N/A   Social History Main Topics  . Smoking status: Current Every Day Smoker    Packs/day: 1.00    Types: Cigarettes  . Smokeless tobacco: Never Used  . Alcohol use Yes  . Drug use:     Types: Marijuana, Cocaine  . Sexual activity: Yes    Birth control/ protection: IUD   Other Topics Concern  . None   Social History Narrative  . None   Additional Social History:    Allergies:  Allergies  Allergen Reactions  . Other Hives    Hawaiian punch - red dye  . Red Dye Other (See Comments)    Listed on paperwork  . Zyprexa [Olanzapine] Other (See Comments)    Reaction is unknown  " jerking "     Labs:  Results for orders placed or performed during the hospital encounter of 01/15/16 (from the past 48 hour(s))  Rapid urine drug screen (hospital performed)     Status: Abnormal   Collection Time:  01/15/16 11:19 AM  Result Value Ref Range   Opiates NONE DETECTED NONE DETECTED   Cocaine NONE DETECTED NONE DETECTED   Benzodiazepines NONE DETECTED NONE DETECTED   Amphetamines NONE DETECTED NONE DETECTED   Tetrahydrocannabinol POSITIVE (A) NONE DETECTED   Barbiturates NONE DETECTED NONE DETECTED    Comment:        DRUG SCREEN FOR MEDICAL PURPOSES ONLY.  IF CONFIRMATION IS NEEDED FOR ANY PURPOSE, NOTIFY LAB WITHIN 5 DAYS.        LOWEST DETECTABLE LIMITS FOR URINE DRUG SCREEN Drug Class       Cutoff (ng/mL) Amphetamine      1000 Barbiturate      200 Benzodiazepine   098 Tricyclics       119 Opiates          300 Cocaine          300 THC              50   Comprehensive metabolic panel     Status: Abnormal   Collection Time: 01/15/16 11:32 AM  Result Value Ref Range   Sodium 137 135 - 145 mmol/L   Potassium 3.1 (L) 3.5 - 5.1 mmol/L   Chloride 105 101 - 111 mmol/L   CO2 23 22 - 32 mmol/L   Glucose, Bld 113 (H) 65 - 99 mg/dL   BUN 5 (L) 6 - 20 mg/dL   Creatinine, Ser 0.60 0.44 - 1.00 mg/dL   Calcium 9.6 8.9 - 10.3 mg/dL   Total Protein 7.5 6.5 - 8.1 g/dL   Albumin 4.6 3.5 - 5.0 g/dL   AST 26 15 - 41 U/L   ALT 26 14 - 54 U/L   Alkaline Phosphatase 48 38 - 126 U/L   Total Bilirubin 1.6 (H) 0.3 - 1.2 mg/dL   GFR calc non Af Amer >60 >60 mL/min   GFR calc Af Amer >60 >60 mL/min    Comment: (NOTE) The eGFR has been calculated using the CKD EPI equation. This calculation has not been validated in all clinical situations. eGFR's persistently <60 mL/min signify possible Chronic Kidney Disease.    Anion gap 9 5 - 15  cbc     Status: None   Collection Time: 01/15/16 11:32 AM  Result Value Ref Range   WBC 7.8 4.0 - 10.5 K/uL   RBC 4.29 3.87 - 5.11 MIL/uL   Hemoglobin 13.5 12.0 - 15.0 g/dL   HCT 37.5 36.0 - 46.0 %   MCV 87.4 78.0 - 100.0 fL   MCH 31.5 26.0 - 34.0 pg   MCHC 36.0 30.0 - 36.0 g/dL   RDW 12.9 11.5 - 15.5 %   Platelets 218 150 - 400 K/uL  Ethanol     Status:  None   Collection Time: 01/15/16 11:33 AM  Result Value Ref Range   Alcohol, Ethyl (B) <5 <5 mg/dL    Comment:        LOWEST DETECTABLE LIMIT FOR SERUM ALCOHOL IS 5  mg/dL FOR MEDICAL PURPOSES ONLY   Salicylate level     Status: None   Collection Time: 01/15/16 11:33 AM  Result Value Ref Range   Salicylate Lvl <7.3 2.8 - 30.0 mg/dL  Acetaminophen level     Status: Abnormal   Collection Time: 01/15/16 11:33 AM  Result Value Ref Range   Acetaminophen (Tylenol), Serum <10 (L) 10 - 30 ug/mL    Comment:        THERAPEUTIC CONCENTRATIONS VARY SIGNIFICANTLY. A RANGE OF 10-30 ug/mL MAY BE AN EFFECTIVE CONCENTRATION FOR MANY PATIENTS. HOWEVER, SOME ARE BEST TREATED AT CONCENTRATIONS OUTSIDE THIS RANGE. ACETAMINOPHEN CONCENTRATIONS >150 ug/mL AT 4 HOURS AFTER INGESTION AND >50 ug/mL AT 12 HOURS AFTER INGESTION ARE OFTEN ASSOCIATED WITH TOXIC REACTIONS.   POC Urine Pregnancy, ED (do NOT order at [facility])     Status: None   Collection Time: 01/15/16  4:02 PM  Result Value Ref Range   Preg Test, Ur NEGATIVE NEGATIVE    Comment:        THE SENSITIVITY OF THIS METHODOLOGY IS >24 mIU/mL     Current Facility-Administered Medications  Medication Dose Route Frequency Provider Last Rate Last Dose  . asenapine (SAPHRIS) sublingual tablet 10 mg  10 mg Sublingual Q8H PRN Dusan Lipford, MD      . Oxcarbazepine (TRILEPTAL) tablet 300 mg  300 mg Oral BID Corena Pilgrim, MD   300 mg at 01/16/16 1210  . QUEtiapine (SEROQUEL) tablet 100 mg  100 mg Oral QHS Laporchia Nakajima, MD      . traZODone (DESYREL) tablet 100 mg  100 mg Oral QHS PRN Corena Pilgrim, MD       Current Outpatient Prescriptions  Medication Sig Dispense Refill  . ARIPiprazole (ABILIFY MAINTENA) 400 MG SUSR Inject 400 mg into the muscle every 30 (thirty) days.    . benztropine (COGENTIN) 1 MG tablet Take 1 mg by mouth 2 (two) times daily.    . carbamazepine (TEGRETOL) 200 MG tablet Take 200 mg by mouth daily.     . risperiDONE  (RISPERDAL) 3 MG tablet Take 6 mg by mouth at bedtime.    . ARIPiprazole (ABILIFY) 15 MG tablet Take 1 tablet (15 mg total) by mouth daily. (Patient not taking: Reported on 07/14/2015) 30 tablet 0  . cephALEXin (KEFLEX) 500 MG capsule Take 1 capsule (500 mg total) by mouth 3 (three) times daily. (Patient not taking: Reported on 01/16/2016) 21 capsule 0  . divalproex (DEPAKOTE ER) 500 MG 24 hr tablet Take 1 tablet (500 mg total) by mouth 2 (two) times daily after a meal. (Patient not taking: Reported on 01/16/2016) 60 tablet 0  . hydrOXYzine (ATARAX/VISTARIL) 50 MG tablet Take 1 tablet (50 mg total) by mouth every 6 (six) hours as needed (mild anxiety). (Patient not taking: Reported on 01/16/2016) 21 tablet 0  . ibuprofen (ADVIL,MOTRIN) 800 MG tablet Take 1 tablet (800 mg total) by mouth 3 (three) times daily. (Patient not taking: Reported on 07/14/2015) 21 tablet 0  . menthol-cetylpyridinium (CEPACOL) 3 MG lozenge Take 1 lozenge (3 mg total) by mouth 3 (three) times daily as needed for sore throat. (Patient not taking: Reported on 07/14/2015) 100 tablet 12  . traZODone (DESYREL) 150 MG tablet Take 1 tablet (150 mg total) by mouth at bedtime. (Patient not taking: Reported on 01/16/2016) 30 tablet 0    Musculoskeletal: Strength & Muscle Tone: within normal limits Gait & Station: normal Patient leans: N/A  Psychiatric Specialty Exam: Physical Exam  Constitutional: She is oriented  to person, place, and time. She appears well-developed and well-nourished.  HENT:  Head: Normocephalic.  Neck: Normal range of motion.  Respiratory: Effort normal.  Musculoskeletal: Normal range of motion.  Neurological: She is alert and oriented to person, place, and time.  Skin: Skin is warm and dry.  Psychiatric: Her mood appears anxious. Her affect is labile. Her speech is rapid and/or pressured and tangential. She is hyperactive. Thought content is delusional. Cognition and memory are normal. She expresses impulsivity.     Review of Systems  Constitutional: Negative.   HENT: Negative.   Eyes: Negative.   Respiratory: Negative.   Cardiovascular: Negative.   Gastrointestinal: Negative.   Genitourinary: Negative.   Musculoskeletal: Negative.   Skin: Negative.   Neurological: Negative.   Endo/Heme/Allergies: Negative.   Psychiatric/Behavioral: The patient is nervous/anxious.     Blood pressure 111/70, pulse 98, temperature 98.1 F (36.7 C), temperature source Oral, resp. rate 16, SpO2 100 %.There is no height or weight on file to calculate BMI.  General Appearance: Disheveled  Eye Contact:  Fair  Speech:  Pressured  Volume:  Increased  Mood:  Anxious and Euphoric  Affect:  Blunt and Labile  Thought Process:  Coherent and Descriptions of Associations: Tangential  Orientation:  Full (Time, Place, and Person)  Thought Content:  Delusions and Tangential  Suicidal Thoughts:  No  Homicidal Thoughts:  No  Memory:  Immediate;   Fair Recent;   Fair Remote;   Fair  Judgement:  Impaired  Insight:  Lacking  Psychomotor Activity:  Increased  Concentration:  Concentration: Poor and Attention Span: Poor  Recall:  [employer] of Knowledge:  Fair  Language:  Good  Akathisia:  No  Handed:  Right  AIMS (if indicated):     Assets:  Housing Leisure Time Physical Health Resilience Social Support  ADL's:  Intact  Cognition:  Impaired,  Mild  Sleep:        Treatment Plan Summary: Daily contact with patient to assess and evaluate symptoms and progress in treatment, Medication management and Plan schizoaffective disorder, bipolar type:  -Crisis stabilization -Medication management:  Discontinued her Abilify, Risperdal, Depakote, Tegretol and Cogentin.  Started Saphris 10 mg TID PRN agitation, Trileptal 300 mg BID for mood stabilization, Trazodone 100 mg at bedtime for sleep, and Seroquel 100 mg at bedtime for sleep and psychosis. -Individual counseling  Disposition: Recommend psychiatric Inpatient admission  when medically cleared.  Waylan Boga, NP 01/16/2016 1:51 PM  Patient seen face-to-face for psychiatric evaluation, chart reviewed and case discussed with the physician extender and developed treatment plan. Reviewed the information documented and agree with the treatment plan. Corena Pilgrim, MD

## 2016-01-16 NOTE — Progress Notes (Signed)
CSW received call from [facility] requesting more information regStockton Bendarding patients behaviors. CSW answered questions to the best of her ability and provided Turner DanielsRowan with [facility] phone number.   Stacy GardnerErin Jemia Fata, LCSWA Clinical Social Worker [phone]

## 2016-01-16 NOTE — Progress Notes (Signed)
Pt standing in hallway outside of her room when CM went to speak with another pt Pt asked Cm "are you a nurse or doctor?" and Cm informed her "No"   CM went to speak with the pt Cm needed to assessed  When CM was leaving to speak with another pt, This pt remained out of her room and made negative statements to CM as she left the hallway CM continued to walk to other pt room Cm heard pt call a [facility] RN coming out another pt room near her to ask who CM was, CM returned to request [facility] nurse to walk away from the pt  Pt continues to stand in her hallway or her in the doorway of her room mumbling

## 2016-01-16 NOTE — Progress Notes (Addendum)
CSW received call from [facility]. Patient has been accepted and is able to be admitted at 8pm or later. Admitting doctor is Jeanie SewerBrian Fariah and number for report is [phone]. [facility] RN notified.   Stacy GardnerErin Coleston Dirosa, LCSWA Clinical Social Worker [phone]

## 2016-01-16 NOTE — ED Notes (Signed)
[facility].  All belongings given to the transport team.  She was escorted out by [place] [employer] officers.  IVC paperwork was handed to the officers.

## 2016-01-16 NOTE — Progress Notes (Signed)
01/16/16 1355:  LRT spoke with pt in hallway about activities.  Pt was upset and stated she just wanted to go home.  Pt stated she didn't want to anything, she just wanted to go home.  Pt went in her room and slammed the door.  Betty RancherMarjette Jarelly Rinck, LRT, CTRS

## 2016-04-16 ENCOUNTER — Emergency Department (HOSPITAL_COMMUNITY)
Admission: EM | Admit: 2016-04-16 | Discharge: 2016-04-18 | Disposition: A | Discharge status: Other Acute Inpatient Hospital | Payer: Medicaid Other | Attending: Emergency Medicine | Admitting: Emergency Medicine

## 2016-04-16 ENCOUNTER — Encounter (HOSPITAL_COMMUNITY): Payer: Self-pay | Admitting: Emergency Medicine

## 2016-04-16 DIAGNOSIS — Z79899 Other long term (current) drug therapy: Secondary | ICD-10-CM | POA: Insufficient documentation

## 2016-04-16 DIAGNOSIS — F17210 Nicotine dependence, cigarettes, uncomplicated: Secondary | ICD-10-CM | POA: Diagnosis not present

## 2016-04-16 DIAGNOSIS — R45850 Homicidal ideations: Secondary | ICD-10-CM | POA: Diagnosis not present

## 2016-04-16 DIAGNOSIS — R45851 Suicidal ideations: Secondary | ICD-10-CM | POA: Insufficient documentation

## 2016-04-16 DIAGNOSIS — F909 Attention-deficit hyperactivity disorder, unspecified type: Secondary | ICD-10-CM | POA: Insufficient documentation

## 2016-04-16 DIAGNOSIS — F250 Schizoaffective disorder, bipolar type: Secondary | ICD-10-CM | POA: Diagnosis not present

## 2016-04-16 DIAGNOSIS — R443 Hallucinations, unspecified: Secondary | ICD-10-CM | POA: Diagnosis present

## 2016-04-16 LAB — CBC
HCT: 38 % (ref 36.0–46.0)
Hemoglobin: 13.7 g/dL (ref 12.0–15.0)
MCH: 32.2 pg (ref 26.0–34.0)
MCHC: 36.1 g/dL — AB (ref 30.0–36.0)
MCV: 89.2 fL (ref 78.0–100.0)
PLATELETS: 231 10*3/uL (ref 150–400)
RBC: 4.26 MIL/uL (ref 3.87–5.11)
RDW: 12.8 % (ref 11.5–15.5)
WBC: 6.6 10*3/uL (ref 4.0–10.5)

## 2016-04-16 LAB — COMPREHENSIVE METABOLIC PANEL
ALBUMIN: 4.7 g/dL (ref 3.5–5.0)
ALK PHOS: 60 U/L (ref 38–126)
ALT: 21 U/L (ref 14–54)
AST: 22 U/L (ref 15–41)
Anion gap: 8 (ref 5–15)
BILIRUBIN TOTAL: 0.9 mg/dL (ref 0.3–1.2)
BUN: 9 mg/dL (ref 6–20)
CALCIUM: 9.5 mg/dL (ref 8.9–10.3)
CO2: 25 mmol/L (ref 22–32)
Chloride: 104 mmol/L (ref 101–111)
Creatinine, Ser: 0.67 mg/dL (ref 0.44–1.00)
GFR calc non Af Amer: >60 mL/min (ref >60)
Glucose, Bld: 86 mg/dL (ref 65–99)
POTASSIUM: 3.6 mmol/L (ref 3.5–5.1)
SODIUM: 137 mmol/L (ref 135–145)
TOTAL PROTEIN: 7.8 g/dL (ref 6.5–8.1)

## 2016-04-16 LAB — ETHANOL: Alcohol, Ethyl (B): <5 mg/dL (ref <5)

## 2016-04-16 LAB — I-STAT BETA HCG BLOOD, ED (MC, WL, AP ONLY)

## 2016-04-16 LAB — ACETAMINOPHEN LEVEL: Acetaminophen (Tylenol), Serum: <10 ug/mL — ABNORMAL LOW (ref 10–30)

## 2016-04-16 LAB — SALICYLATE LEVEL

## 2016-04-16 NOTE — ED Notes (Addendum)
Pt. In burgundy scrubs. Pt. And belongings searched and wanded by security. Pt. Has 1 belongings bag. Pt. Has 1 cell phone pink(samsung),1 ring, 1 necklace, 1 pr. White tennis shoes, 1 pr. Black socks, 1 pink sweater, 1 pink under pants, 1pink/black stretch pants, 1 blue sweater and 1 white charger.pt. Belongings locked up at the nurses station in the yellow zone.

## 2016-04-16 NOTE — ED Notes (Addendum)
Patient placed in scrubs and belongings placed at nurses station. Pt wanded by security. Patient was unable to give urine sample at present time.

## 2016-04-16 NOTE — ED Provider Notes (Signed)
[facility]-EMERGENCY DEPT Provider Note   CSN: [id number] Arrival date & time: 04/16/16  2030  By signing my name below, I, Rosario Adie, attest that this documentation has been prepared under the direction and in the presence of Geoffery Lyons, MD. Electronically Signed: Rosario Adie, ED Scribe. 04/16/16. 11:07 PM.  History   Chief Complaint Chief Complaint  Patient presents with  . IVC   The history is provided by the patient, medical records and the police. No language interpreter was used.  Illness  This is a recurrent problem. The problem occurs constantly. The problem has been gradually worsening. Pertinent negatives include no chest pain, no abdominal pain, no headaches and no shortness of breath. Nothing aggravates the symptoms. Nothing relieves the symptoms. She has tried nothing for the symptoms.    HPI Comments: Betty Parrish is a 26 y.o. female B[name], with h/o ADHD, anxiety, cannabis abuse, bipolar disorder, and schizoaffective disorder, who presents to the Emergency Department under IVC d/t increasing auditory hallucinations beginning several days ago. Pt was IVC'd by her mother and who noted that she was "diagnosed with bipolar disorder and schizophrenia last year and was hospitalized one year ago" d/t this. She also noted on her paperwork that she believes that the pt has been hearing voices as she has been asking "don't you hear what they are saying". She also previously has said to her petitioner that she "feels like killing someone". Her petitioner also noted that she smokes marijuana and abuses prescription drugs. Per prior chart review, pt was last admitted d/t her psychiatric history on 01/16/16 (~3 months ago). Pt denies SI/HI at bedside, or any other associated symptoms.   Past Medical History:  Diagnosis Date  . ADHD (attention deficit hyperactivity disorder)   . Anxiety   . Heart murmur   . Schizo-affective psychosis ([facility])   . Sickle cell trait [facility])     Patient Active Problem List   Diagnosis Date Noted  . Cannabis abuse   . Bipolar disorder, manic phase ([facility]) 09/30/2014  . Schizoaffective disorder, bipolar type ([facility]) 09/29/2014  . Mania ([facility]) 12/18/2012   History reviewed. No pertinent surgical history.  OB History    No data available     Home Medications    Prior to Admission medications   Medication Sig Start Date End Date Taking? Authorizing Provider  ARIPiprazole (ABILIFY MAINTENA) 400 MG SUSR Inject 400 mg into the muscle every 30 (thirty) days.    Historical Provider, MD  ARIPiprazole (ABILIFY) 15 MG tablet Take 1 tablet (15 mg total) by mouth daily. Patient not taking: Reported on 07/14/2015 10/04/14   Beau Fanny, FNP  benztropine (COGENTIN) 1 MG tablet Take 1 mg by mouth 2 (two) times daily.    Historical Provider, MD  carbamazepine (TEGRETOL) 200 MG tablet Take 200 mg by mouth daily.     Historical Provider, MD  cephALEXin (KEFLEX) 500 MG capsule Take 1 capsule (500 mg total) by mouth 3 (three) times daily. Patient not taking: Reported on 01/16/2016 11/10/15   Ace Gins Sam, PA-C  divalproex (DEPAKOTE ER) 500 MG 24 hr tablet Take 1 tablet (500 mg total) by mouth 2 (two) times daily after a meal. Patient not taking: Reported on 01/16/2016 10/04/14   Beau Fanny, FNP  hydrOXYzine (ATARAX/VISTARIL) 50 MG tablet Take 1 tablet (50 mg total) by mouth every 6 (six) hours as needed (mild anxiety). Patient not taking: Reported on 01/16/2016 10/04/14   Beau Fanny, FNP  ibuprofen (ADVIL,MOTRIN)  800 MG tablet Take 1 tablet (800 mg total) by mouth 3 (three) times daily. Patient not taking: Reported on 07/14/2015 04/20/15   Shawn C Joy, PA-C  menthol-cetylpyridinium (CEPACOL) 3 MG lozenge Take 1 lozenge (3 mg total) by mouth 3 (three) times daily as needed for sore throat. Patient not taking: Reported on 07/14/2015 10/04/14   Beau FannyJohn C Withrow, FNP  risperiDONE (RISPERDAL) 3 MG tablet Take 6 mg by mouth at bedtime.    Historical Provider, MD   traZODone (DESYREL) 150 MG tablet Take 1 tablet (150 mg total) by mouth at bedtime. Patient not taking: Reported on 01/16/2016 10/04/14   Beau FannyJohn C Withrow, FNP    Family History Family History  Problem Relation Age of Onset  . Sickle cell anemia Mother     Social History Social History  Substance Use Topics  . Smoking status: Current Every Day Smoker    Packs/day: 1.00    Types: Cigarettes  . Smokeless tobacco: Never Used  . Alcohol use Yes   Allergies   Other; Red dye; and Zyprexa [olanzapine]  Review of Systems Review of Systems  Constitutional: Negative for fever.  Respiratory: Negative for shortness of breath.   Cardiovascular: Negative for chest pain.  Gastrointestinal: Negative for abdominal pain.  Neurological: Negative for headaches.  Psychiatric/Behavioral: Positive for hallucinations. Negative for suicidal ideas.  All other systems reviewed and are negative.  Physical Exam Updated Vital Signs BP 108/80 (BP Location: Left Arm)   Pulse 80   Temp 98.5 F (36.9 C) (Oral)   Resp 16   Ht 5\' 7"  (1.702 m)   SpO2 97%   Physical Exam  Constitutional: She appears well-developed and well-nourished.  HENT:  Head: Normocephalic and atraumatic.  Eyes: Conjunctivae are normal. Right eye exhibits no discharge. Left eye exhibits no discharge.  Neck: Normal range of motion.  Cardiovascular: Normal rate.   Pulmonary/Chest: Effort normal. No respiratory distress.  Abdominal: She exhibits no distension.  Neurological: She is alert.  Skin: Skin is warm and dry. She is not diaphoretic.  Psychiatric: She has a normal mood and affect.  Nursing note and vitals reviewed.  ED Treatments / Results  DIAGNOSTIC STUDIES: Oxygen Saturation is 97% on RA, normal by my interpretation.   COORDINATION OF CARE: 11:06 PM-Discussed next steps with pt. Pt verbalized understanding and is agreeable with the plan.   Labs (all labs ordered are listed, but only abnormal results are  displayed) Labs Reviewed  ACETAMINOPHEN LEVEL - Abnormal; Notable for the following:       Result Value   Acetaminophen (Tylenol), Serum <10 (*)    All other components within normal limits  CBC - Abnormal; Notable for the following:    MCHC 36.1 (*)    All other components within normal limits  COMPREHENSIVE METABOLIC PANEL  ETHANOL  SALICYLATE LEVEL  RAPID URINE DRUG SCREEN, HOSP PERFORMED  I-STAT BETA HCG BLOOD, ED ([facility], [facility], [facility] ONLY)    EKG  EKG Interpretation None       Radiology No results found.  Procedures Procedures (including critical care time)  Medications Ordered in ED Medications - No data to display   Initial Impression / Assessment and Plan / ED Course  I have reviewed the triage vital signs and the nursing notes.  Pertinent labs & imaging results that were available during my care of the patient were reviewed by me and considered in my medical decision making (see chart for details).  Patient will undergo evaluation by TTS for her  homicidal/suicidal ideation. They will determine the final disposition.  Final Clinical Impressions(s) / ED Diagnoses   Final diagnoses:  None    New Prescriptions New Prescriptions   No medications on file   I personally performed the services described in this documentation, which was scribed in my presence. The recorded information has been reviewed and is accurate.       Geoffery Lyons, MD 04/17/16 [phone]

## 2016-04-16 NOTE — ED Triage Notes (Signed)
Per [name] pt brought in IVCd by mother. Papers state "Diagnosed with bi-polar and schizophrenia and has been hospitalized for same last year. Petitioner believes she is hearing voices because she keeps asking "Don't you hear what they are saying." Reported to petitioner that she feels like killing someone and carry's a knife on her person, smokes marijuana daily and abuses prescription drugs."

## 2016-04-17 LAB — RAPID URINE DRUG SCREEN, HOSP PERFORMED
AMPHETAMINES: NOT DETECTED
BENZODIAZEPINES: NOT DETECTED
Barbiturates: NOT DETECTED
Cocaine: NOT DETECTED
Opiates: NOT DETECTED
TETRAHYDROCANNABINOL: POSITIVE — AB

## 2016-04-17 MED ORDER — TRAZODONE HCL 100 MG PO TABS
100.0000 mg | ORAL_TABLET | Freq: Every day | ORAL | Status: DC
Start: 2016-04-17 — End: 2016-04-18
  Administered 2016-04-17: 100 mg via ORAL
  Filled 2016-04-17: qty 1

## 2016-04-17 MED ORDER — MAGNESIUM HYDROXIDE 400 MG/5ML PO SUSP
30.0000 mL | Freq: Every day | ORAL | Status: DC | PRN
  Administered 2016-04-17: 30 mL via ORAL
  Filled 2016-04-17 (×2): qty 30

## 2016-04-17 MED ORDER — LAMOTRIGINE 25 MG PO TABS
25.0000 mg | ORAL_TABLET | Freq: Every day | ORAL | Status: DC
  Administered 2016-04-17: 25 mg via ORAL
  Filled 2016-04-17: qty 1

## 2016-04-17 MED ORDER — ACETAMINOPHEN 325 MG PO TABS
650.0000 mg | ORAL_TABLET | ORAL | Status: DC | PRN
  Administered 2016-04-17: 650 mg via ORAL
  Filled 2016-04-17: qty 2

## 2016-04-17 MED ORDER — LORAZEPAM 1 MG PO TABS
1.0000 mg | ORAL_TABLET | Freq: Three times a day (TID) | ORAL | Status: DC | PRN
  Administered 2016-04-17 – 2016-04-18 (×2): 1 mg via ORAL
  Filled 2016-04-17 (×2): qty 1

## 2016-04-17 MED ORDER — NICOTINE 21 MG/24HR TD PT24
21.0000 mg | MEDICATED_PATCH | Freq: Every day | TRANSDERMAL | Status: DC
  Administered 2016-04-17: 21 mg via TRANSDERMAL
  Filled 2016-04-17: qty 1

## 2016-04-17 MED ORDER — ASENAPINE MALEATE 5 MG SL SUBL
10.0000 mg | SUBLINGUAL_TABLET | Freq: Two times a day (BID) | SUBLINGUAL | Status: DC | PRN
  Administered 2016-04-17 – 2016-04-18 (×2): 10 mg via SUBLINGUAL
  Filled 2016-04-17 (×2): qty 2

## 2016-04-17 MED ORDER — ARIPIPRAZOLE 5 MG PO TABS
5.0000 mg | ORAL_TABLET | Freq: Two times a day (BID) | ORAL | Status: DC
  Administered 2016-04-17 – 2016-04-18 (×2): 5 mg via ORAL
  Filled 2016-04-17 (×2): qty 1

## 2016-04-17 NOTE — BH Assessment (Signed)
[facility] Assessment Progress Note  Per Thedore MinsMojeed Akintayo, MD, this pt requires psychiatric hospitalization at this time.  Pt presents under IVC initiated by her mother, which Dr Jannifer FranklinAkintayo has upheld.  At 15:44, Corrie DandyMary [facility] to report that pt has been accepted to their facility by Dr Enedina FinnerGoli.  Alberteen SamFran Hobson, FNP concurs with this decision.  Pt's nurse, Rudean HittDawnaly, has been notified, and agrees to call report to [phone].  [facility]  Doylene Canninghomas Blakelyn Dinges, MA Triage Specialist [phone]

## 2016-04-17 NOTE — ED Notes (Signed)
Pt resting at present, no distress noted at present, calm & cooperative at present.  Monitoring for safety, Q 15 min checks in effect.  Pending [facility] in am.

## 2016-04-17 NOTE — ED Notes (Signed)
[facility]

## 2016-04-17 NOTE — Progress Notes (Signed)
04/17/16 1412:  LRT introduced self to pt and offered activities.  Pt stated she wanted to play [employer].  LRT and pt played [employer].  Pt was bright, social and appropriate throughout activity.  Caroll RancherMarjette Empress Newmann, LRT/CTRS

## 2016-04-17 NOTE — BH Assessment (Signed)
Tele Assessment Note   Betty Parrish is an 26 y.o. female.  -Clinician reviewed note by Dr. Judd Lien.  Patient came in on IVC.  Her mother is the Hotel manager.  IVC papers allege that patient has been hearing voices "asking if people can hear what they are saying."  Patient has said "she feels like killing someone."  Patient has been using marijuana and allegedly abusing prescription drugs.  Patient is drowsy upon assessment.  She is short tempered and does not want to be bothered.  Patient did say that she has an ACTT team through [facility] and that she called their crisis line before comign in to hospital.  Patient denies any SI.  She is unclear about whether she is having suicidal thoughts.  She claims no auditory hallucinations nor visual hallucinations.  Patient has had previous hospitalizations.  Was at [facility] in November '17 and May '17.  Was in [facility] in 09/2014.  Patient has ACTT team services  -Clinician discussed patient care with Donell Sievert, PA who recommended review of IVC to rescind or uphold it.  Diagnosis: Schizoaffective d/o  Past Medical History:  Past Medical History:  Diagnosis Date  . ADHD (attention deficit hyperactivity disorder)   . Anxiety   . Heart murmur   . Schizo-affective psychosis (HCC)   . Sickle cell trait (HCC)     History reviewed. No pertinent surgical history.  Family History:  Family History  Problem Relation Age of Onset  . Sickle cell anemia Mother     Social History:  reports that she has been smoking Cigarettes.  She has been smoking about 1.00 pack per day. She has never used smokeless tobacco. She reports that she drinks alcohol. She reports that she uses drugs, including Marijuana and Cocaine.  Additional Social History:  Alcohol / Drug Use Pain Medications: See PTA medication list Prescriptions: Pt unclear Over the Counter: None History of alcohol / drug use?: Yes Substance #1 Name of Substance 1: Marijuana 1 -  Age of First Use: 26 years of age 45 - Amount (size/oz): "A blunt every now & then" 1 - Frequency: Every other weekend or every other day "Varies" 1 - Duration: on-going 1 - Last Use / Amount: 02/07  CIWA: CIWA-Ar BP: 104/62 Pulse Rate: 73 COWS:    PATIENT STRENGTHS: (choose at least two) Average or above average intelligence Communication skills Supportive family/friends  Allergies:  Allergies  Allergen Reactions  . Other Hives    Hawaiian punch - red dye  . Red Dye Other (See Comments)    Listed on paperwork  . Zyprexa [Olanzapine] Other (See Comments)    Reaction is unknown  " jerking "     Home Medications:  (Not in a hospital admission)  OB/GYN Status:  No LMP recorded. Patient is not currently having periods (Reason: IUD).  General Assessment Data [facility] of [facility]: [facility] TTS Assessment: In system Is this a Tele or Face-to-Face Assessment?: Face-to-Face Is this an Initial Assessment or a Re-assessment for this encounter?: Initial Assessment Marital status: Single Is patient pregnant?: No Pregnancy Status: No Living Arrangements: Parent (Lives with mother.) Can pt return to current living arrangement?: Yes Admission Status: Involuntary Is patient capable of signing voluntary admission?: No Referral Source: Self/Family/Friend (Mother took out IVC) Insurance type: MCD     Crisis Care Plan Living Arrangements: Parent (Lives with mother.) Name of Psychiatrist: [facility] (ACTT) Name of Therapist: [facility] (ACTT)  Education Status Is patient currently in school?: No  Highest grade of school patient has completed: 11th grade  Risk to self with the past 6 months Suicidal Ideation: No Has patient been a risk to self within the past 6 months prior to admission? : No Suicidal Intent: No Has patient had any suicidal intent within the past 6 months prior to admission? : No Is patient at risk for suicide?: No (Pt says "Hell no" to  wanting to kill herself.) Suicidal Plan?: No Has patient had any suicidal plan within the past 6 months prior to admission? : No Access to Means: No What has been your use of drugs/alcohol within the last 12 months?: Marijuana Previous Attempts/Gestures: No How many times?: 0 Other Self Harm Risks: Pt denies Triggers for Past Attempts: None known Intentional Self Injurious Behavior: None Family Suicide History: No Recent stressful life event(s): Financial Problems, Turmoil (Comment) (Problems with motehr) Persecutory voices/beliefs?: Yes Depression: Yes Depression Symptoms: Despondent, Guilt, Loss of interest in usual pleasures, Feeling worthless/self pity Substance abuse history and/or treatment for substance abuse?: Yes Suicide prevention information given to non-admitted patients: Not applicable  Risk to Others within the past 6 months Homicidal Ideation: No-Not Currently/Within Last 6 Months Does patient have any lifetime risk of violence toward others beyond the six months prior to admission? : Unknown Thoughts of Harm to Others: No-Not Currently Present/Within Last 6 Months Current Homicidal Intent: No Current Homicidal Plan: No Access to Homicidal Means: No Identified Victim: No one in particular History of harm to others?: No Assessment of Violence: None Noted Violent Behavior Description: Patient unclear Does patient have access to weapons?: No Criminal Charges Pending?: No Does patient have a court date: No Is patient on probation?: No  Psychosis Hallucinations: None noted (Patient denies) Delusions: None noted  Mental Status Report Appearance/Hygiene: Disheveled, In scrubs Eye Contact: Poor Motor Activity: Freedom of movement, Unremarkable Speech: Incoherent, Slurred Level of Consciousness: Drowsy Mood: Depressed Affect: Apprehensive, Appropriate to circumstance Anxiety Level: Moderate Thought Processes: Coherent Judgement: Unable to Assess Orientation:  Appropriate for developmental age Obsessive Compulsive Thoughts/Behaviors: None  Cognitive Functioning Concentration: Decreased Memory: Recent Impaired, Remote Intact IQ: Average Insight: Poor Impulse Control: Poor Appetite: Good Weight Loss: 0 Weight Gain: 0 Sleep: Decreased Total Hours of Sleep: 6 Vegetative Symptoms: None  ADLScreening [facility] Assessment Services) Patient's cognitive ability adequate to safely complete daily activities?: Yes Patient able to express need for assistance with ADLs?: Yes Independently performs ADLs?: Yes (appropriate for developmental age)  Prior Inpatient Therapy Prior Inpatient Therapy: Yes Prior Therapy Dates: 01/2016, 07/2015; 09/2014 Prior Therapy Facilty/Provider(s): [facility]; [facility] Reason for Treatment: SA and SI  Prior Outpatient Therapy Prior Outpatient Therapy: Yes Prior Therapy Dates: One year Prior Therapy Facilty/Provider(s): [facility] Reason for Treatment: ACTT team Does patient have an ACCT team?: Yes Does patient have Intensive In-House Services?  : No Does patient have [facility] services? : No Does patient have P4CC services?: No  ADL Screening (condition at time of admission) Patient's cognitive ability adequate to safely complete daily activities?: Yes Is the patient deaf or have difficulty hearing?: No Does the patient have difficulty seeing, even when wearing glasses/contacts?: Yes Does the patient have difficulty concentrating, remembering, or making decisions?: Yes Patient able to express need for assistance with ADLs?: Yes Does the patient have difficulty dressing or bathing?: No Independently performs ADLs?: Yes (appropriate for developmental age) Does the patient have difficulty walking or climbing stairs?: No Weakness of Legs: None Weakness of Arms/Hands: None       Abuse/Neglect Assessment (Assessment to be  complete while patient is alone) Physical Abuse: Denies Verbal Abuse: Denies Sexual Abuse:  Denies Exploitation of patient/patient's resources: Denies Self-Neglect: Denies     Merchant navy officerAdvance Directives (For Healthcare) Does Patient Have a Medical Advance Directive?: No    Additional Information 1:1 In Past 12 Months?: No CIRT Risk: No Elopement Risk: No Does patient have medical clearance?: Yes     Disposition:  Disposition Initial Assessment Completed for this Encounter: Yes Disposition of Patient: Other dispositions (To be reviewed with PA) Other disposition(s): Other (Comment) (To be reviewed with PA)  Beatriz StallionHarvey, Loi Rennaker Ray 04/17/2016 3:39 AM
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
## 2016-04-17 NOTE — ED Notes (Signed)
Alveda ReasonsKapri has been loud and disruptive most of the day.  She is able to be re-directed.  Early this morning she complained of constipation and requested something to help with that.  I gave her a dose of Milk of Magnesia and she had a bowel movement, but states she now has diarrhea.  She has made frequent demands to be [facility].  They have been notified and we are awaiting a decision from them.  Her appetite is good, she is taking prescribed medications and she is tending to hygiene.

## 2016-04-17 NOTE — ED Notes (Signed)
Betty Parrish has been loud and occasionally disruptive today.  She did stay focused with the Rec Coordinator for about 30 minutes.  [facility]   [facility]  She agrees to go with them to the accepting facility.

## 2016-04-18 NOTE — ED Notes (Signed)
Pt agitated, disrupting other pts in the hallway, cursing and talking loudly.  Pt requested Abilify dose early, given.  Saphris given for agitation also, pt redirected back to room with security assistance.

## 2016-04-18 NOTE — ED Notes (Signed)
[facility]. She was anxious. Pt called family prior to D/C. Pt given Ativan 1 mg to help alleviate anxiety for the transport. All belongings returned to pt who signed for them.

## 2016-08-27 ENCOUNTER — Emergency Department (HOSPITAL_COMMUNITY)
Admission: EM | Admit: 2016-08-27 | Discharge: 2016-08-28 | Disposition: A | Discharge status: Home / Self Care | Payer: Medicaid Other | Attending: Emergency Medicine | Admitting: Emergency Medicine

## 2016-08-27 ENCOUNTER — Encounter (HOSPITAL_COMMUNITY): Payer: Self-pay | Admitting: Obstetrics and Gynecology

## 2016-08-27 DIAGNOSIS — F17210 Nicotine dependence, cigarettes, uncomplicated: Secondary | ICD-10-CM | POA: Diagnosis not present

## 2016-08-27 DIAGNOSIS — R1030 Lower abdominal pain, unspecified: Secondary | ICD-10-CM

## 2016-08-27 DIAGNOSIS — B9689 Other specified bacterial agents as the cause of diseases classified elsewhere: Secondary | ICD-10-CM

## 2016-08-27 DIAGNOSIS — R102 Pelvic and perineal pain: Secondary | ICD-10-CM | POA: Insufficient documentation

## 2016-08-27 DIAGNOSIS — Z79899 Other long term (current) drug therapy: Secondary | ICD-10-CM | POA: Diagnosis not present

## 2016-08-27 DIAGNOSIS — N760 Acute vaginitis: Secondary | ICD-10-CM | POA: Insufficient documentation

## 2016-08-27 DIAGNOSIS — N898 Other specified noninflammatory disorders of vagina: Secondary | ICD-10-CM

## 2016-08-27 NOTE — ED Notes (Signed)
Bed: WA02 Expected date:  Expected time:  Means of arrival:  Comments: 26 yo F  abd pain

## 2016-08-27 NOTE — ED Triage Notes (Signed)
Per EMS: Pt is coming from a youth living home  [address]  Aproximately 3 hours ago pt started having abdominal cramps. Pt just got off her period. Pt recently ate a hot dog. No N/V  118/72 Pulse 80 RR16  Pt states she is concerned about STDs. Pt states it does not hurt to urinate, but intercourse hurts.

## 2016-08-28 LAB — URINALYSIS, ROUTINE W REFLEX MICROSCOPIC
BILIRUBIN URINE: NEGATIVE
Glucose, UA: NEGATIVE mg/dL
HGB URINE DIPSTICK: NEGATIVE
Ketones, ur: NEGATIVE mg/dL
NITRITE: NEGATIVE
Protein, ur: 30 mg/dL — AB
SPECIFIC GRAVITY, URINE: 1.025 (ref 1.005–1.030)
pH: 5 (ref 5.0–8.0)

## 2016-08-28 LAB — WET PREP, GENITAL
SPERM: NONE SEEN
TRICH WET PREP: NONE SEEN
YEAST WET PREP: NONE SEEN

## 2016-08-28 LAB — HIV ANTIBODY (ROUTINE TESTING W REFLEX): HIV Screen 4th Generation wRfx: NONREACTIVE

## 2016-08-28 LAB — RPR: RPR: NONREACTIVE

## 2016-08-28 LAB — PREGNANCY, URINE: Preg Test, Ur: NEGATIVE

## 2016-08-28 MED ORDER — CEFTRIAXONE SODIUM 250 MG IJ SOLR
250.0000 mg | Freq: Once | INTRAMUSCULAR | Status: AC
  Administered 2016-08-28: 250 mg via INTRAMUSCULAR
  Filled 2016-08-28: qty 250

## 2016-08-28 MED ORDER — LIDOCAINE HCL 1 % IJ SOLN
INTRAMUSCULAR | Status: AC
  Administered 2016-08-28: 20 mL
  Filled 2016-08-28: qty 20

## 2016-08-28 MED ORDER — AZITHROMYCIN 1 G PO PACK
1.0000 g | PACK | Freq: Once | ORAL | Status: AC
  Administered 2016-08-28: 1 g via ORAL
  Filled 2016-08-28: qty 1

## 2016-08-28 MED ORDER — METRONIDAZOLE 0.75 % VA GEL: 1.0000 | Freq: Two times a day (BID) | VAGINAL | 0 refills | Status: DC

## 2016-08-28 NOTE — ED Provider Notes (Signed)
[facility]-EM[facility]ENCY DEPT Provider Note   CSN: [id number] Arrival date & time: 08/27/16  2249     History   Chief Complaint Chief Complaint  Patient presents with  . Abdominal Pain  . Vaginal Pain    HPI Betty Parrish is a 26 y.o. female.  Patient presents for evaluation of lower abdominal pain that started today. She reports malodorous vaginal discharge. No dysuria, fever, nausea or vomiting. She took an ibuprofen prior to coming here and reports the abdominal pain is resolved. She also has back pain, but reports her back "always hurts" and is no different today.    The history is provided by the patient. No language interpreter was used.  Abdominal Pain   Pertinent negatives include fever, nausea, vomiting and dysuria.  Vaginal Pain  Associated symptoms include abdominal pain.    Past Medical History:  Diagnosis Date  . ADHD (attention deficit hyperactivity disorder)   . Anxiety   . Heart murmur   . Schizo-affective psychosis (HCC)   . Sickle cell trait [facility])     Patient Active Problem List   Diagnosis Date Noted  . Cannabis abuse   . Bipolar disorder, manic phase (HCC) 09/30/2014  . Schizoaffective disorder, bipolar type (HCC) 09/29/2014  . Mania (HCC) 12/18/2012    History reviewed. No pertinent surgical history.  OB History    No data available       Home Medications    Prior to Admission medications   Medication Sig Start Date End Date Taking? Authorizing Provider  benztropine (COGENTIN) 1 MG tablet Take 1 mg by mouth 2 (two) times daily.   Yes [provider]  divalproex (DEPAKOTE ER) 250 MG 24 hr tablet Take 500 mg by mouth daily.   Yes [provider]  haloperidol (HALDOL) 5 MG tablet Take 10 mg by mouth at bedtime.   Yes [provider]  traZODone (DESYREL) 100 MG tablet Take 100 mg by mouth at bedtime as needed for sleep.   Yes [provider]  ARIPiprazole (ABILIFY) 15 MG tablet Take 1 tablet (15 mg total) by mouth  daily. Patient not taking: Reported on 07/14/2015 10/04/14   Withrow, Everardo All, FNP  cephALEXin (KEFLEX) 500 MG capsule Take 1 capsule (500 mg total) by mouth 3 (three) times daily. Patient not taking: Reported on 01/16/2016 11/10/15   Sam, Ace Gins, PA-C  divalproex (DEPAKOTE ER) 500 MG 24 hr tablet Take 1 tablet (500 mg total) by mouth 2 (two) times daily after a meal. Patient not taking: Reported on 08/27/2016 10/04/14   Beau Fanny, FNP  hydrOXYzine (ATARAX/VISTARIL) 50 MG tablet Take 1 tablet (50 mg total) by mouth every 6 (six) hours as needed (mild anxiety). Patient not taking: Reported on 01/16/2016 10/04/14   Beau Fanny, FNP  ibuprofen (ADVIL,MOTRIN) 800 MG tablet Take 1 tablet (800 mg total) by mouth 3 (three) times daily. Patient not taking: Reported on 07/14/2015 04/20/15   Joy, Hillard Danker, PA-C  menthol-cetylpyridinium (CEPACOL) 3 MG lozenge Take 1 lozenge (3 mg total) by mouth 3 (three) times daily as needed for sore throat. Patient not taking: Reported on 07/14/2015 10/04/14   Beau Fanny, FNP  traZODone (DESYREL) 150 MG tablet Take 1 tablet (150 mg total) by mouth at bedtime. Patient not taking: Reported on 01/16/2016 10/04/14   Beau Fanny, FNP    Family History Family History  Problem Relation Age of Onset  . Sickle cell anemia Mother     Social History Social History  Substance Use Topics  . Smoking status: Current Every Day Smoker    Packs/day: 1.00    Types: Cigarettes  . Smokeless tobacco: Never Used  . Alcohol use Yes     Allergies   Other; Red dye; and Zyprexa [olanzapine]   Review of Systems Review of Systems  Constitutional: Negative for fever.  Gastrointestinal: Positive for abdominal pain. Negative for nausea and vomiting.  Genitourinary: Positive for vaginal discharge and vaginal pain. Negative for dysuria and flank pain.  Musculoskeletal: Positive for back pain.     Physical Exam Updated Vital Signs BP 111/69 (BP Location: Right Arm)   Pulse 78    Temp 98.6 F (37 C) (Oral)   Resp 18   LMP 08/04/2016 (Exact Date)   SpO2 97%   Physical Exam  Constitutional: She appears well-developed and well-nourished.  HENT:  Head: Normocephalic.  Neck: Normal range of motion. Neck supple.  Cardiovascular: Normal rate.   Pulmonary/Chest: Effort normal.  Abdominal: Soft. Bowel sounds are normal. There is no tenderness. There is no rebound and no guarding.  Genitourinary:  Genitourinary Comments: Copious vaginal discharge present that is thn, clumpin and greenish in color. No CMT, adnexal mass or tenderness.   Musculoskeletal: Normal range of motion.  Neurological: She is alert.  Skin: Skin is warm and dry. No rash noted.  Psychiatric: She has a normal mood and affect.     ED Treatments / Results  Labs (all labs ordered are listed, but only abnormal results are displayed) Labs Reviewed - No data to display  EKG  EKG Interpretation None       Radiology No results found.  Procedures Procedures (including critical care time)  Medications Ordered in ED Medications - No data to display   Initial Impression / Assessment and Plan / ED Course  I have reviewed the triage vital signs and the nursing notes.  Pertinent labs & imaging results that were available during my care of the patient were reviewed by me and considered in my medical decision making (see chart for details).     Patient here for evaluation of abdominal pain and vaginal discharge. Abdominal pain resolved prior to arrival with ibuprofen. Large amount vaginal discharge without pelvic tenderness. Clue cells on wet prep. Rocephin and zithromax given in the ED. Cultures pending. Will given Rx for Flagyl. Encourage PCP follow up.  Final Clinical Impressions(s) / ED Diagnoses   Final diagnoses:  None   1. Abdominal pain, resolved 2. BV   New Prescriptions New Prescriptions   No medications on file     Danne HarborUpstill, [place] Chelf, PA-C 08/28/16 0109    Dione BoozeGlick, David,  MD 08/28/16 [phone]

## 2016-08-29 LAB — GC/CHLAMYDIA PROBE AMP (~~LOC~~) NOT AT ARMC
Chlamydia: NEGATIVE
NEISSERIA GONORRHEA: NEGATIVE

## 2017-03-13 ENCOUNTER — Emergency Department (HOSPITAL_COMMUNITY)
Admission: EM | Admit: 2017-03-13 | Discharge: 2017-03-13 | Disposition: A | Discharge status: Home / Self Care | Payer: Medicaid Other | Attending: Emergency Medicine | Admitting: Emergency Medicine

## 2017-03-13 ENCOUNTER — Encounter (HOSPITAL_COMMUNITY): Payer: Self-pay

## 2017-03-13 DIAGNOSIS — X31XXXA Exposure to excessive natural cold, initial encounter: Secondary | ICD-10-CM | POA: Diagnosis not present

## 2017-03-13 DIAGNOSIS — M549 Dorsalgia, unspecified: Secondary | ICD-10-CM | POA: Diagnosis present

## 2017-03-13 DIAGNOSIS — F909 Attention-deficit hyperactivity disorder, unspecified type: Secondary | ICD-10-CM | POA: Diagnosis not present

## 2017-03-13 DIAGNOSIS — T69029A Immersion foot, unspecified foot, initial encounter: Secondary | ICD-10-CM | POA: Insufficient documentation

## 2017-03-13 DIAGNOSIS — M546 Pain in thoracic spine: Secondary | ICD-10-CM | POA: Insufficient documentation

## 2017-03-13 DIAGNOSIS — F17210 Nicotine dependence, cigarettes, uncomplicated: Secondary | ICD-10-CM | POA: Diagnosis not present

## 2017-03-13 DIAGNOSIS — Z79899 Other long term (current) drug therapy: Secondary | ICD-10-CM | POA: Insufficient documentation

## 2017-03-13 MED ORDER — IBUPROFEN 800 MG PO TABS: 800.0000 mg | ORAL_TABLET | Freq: Once | ORAL | Status: DC

## 2017-03-13 MED ORDER — IBUPROFEN 800 MG PO TABS: 800.0000 mg | ORAL_TABLET | Freq: Three times a day (TID) | ORAL | 0 refills | Status: DC

## 2017-03-13 NOTE — ED Notes (Signed)
PT NOT AT BEDSIDE. ATTEMPTED TO FIND PT. NOT SUCCESSFUL. UNAWARE IF PT RECEIVED DISCHARGE PAPERS

## 2017-03-13 NOTE — ED Notes (Signed)
ED Provider at bedside. 

## 2017-03-13 NOTE — ED Notes (Signed)
I called patient name to recheck vitals and no one responded 

## 2017-03-13 NOTE — ED Notes (Addendum)
ED Provider at bedside. 
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
## 2017-03-16 ENCOUNTER — Emergency Department (HOSPITAL_COMMUNITY)
Admission: EM | Admit: 2017-03-16 | Discharge: 2017-03-17 | Disposition: A | Discharge status: Home / Self Care | Payer: Medicaid Other | Attending: Emergency Medicine | Admitting: Emergency Medicine

## 2017-03-16 ENCOUNTER — Other Ambulatory Visit: Payer: Self-pay

## 2017-03-16 ENCOUNTER — Encounter (HOSPITAL_COMMUNITY): Payer: Self-pay

## 2017-03-16 DIAGNOSIS — Z0441 Encounter for examination and observation following alleged adult rape: Secondary | ICD-10-CM | POA: Insufficient documentation

## 2017-03-16 DIAGNOSIS — F23 Brief psychotic disorder: Secondary | ICD-10-CM

## 2017-03-16 DIAGNOSIS — F17210 Nicotine dependence, cigarettes, uncomplicated: Secondary | ICD-10-CM | POA: Insufficient documentation

## 2017-03-16 DIAGNOSIS — Z79899 Other long term (current) drug therapy: Secondary | ICD-10-CM | POA: Insufficient documentation

## 2017-03-16 LAB — COMPREHENSIVE METABOLIC PANEL
ALBUMIN: 4.7 g/dL (ref 3.5–5.0)
ALT: 20 U/L (ref 14–54)
ANION GAP: 10 (ref 5–15)
AST: 24 U/L (ref 15–41)
Alkaline Phosphatase: 63 U/L (ref 38–126)
BUN: 7 mg/dL (ref 6–20)
CHLORIDE: 103 mmol/L (ref 101–111)
CO2: 24 mmol/L (ref 22–32)
Calcium: 9.7 mg/dL (ref 8.9–10.3)
Creatinine, Ser: 0.74 mg/dL (ref 0.44–1.00)
GFR calc Af Amer: >60 mL/min (ref >60)
GFR calc non Af Amer: >60 mL/min (ref >60)
GLUCOSE: 103 mg/dL — AB (ref 65–99)
POTASSIUM: 3.3 mmol/L — AB (ref 3.5–5.1)
SODIUM: 137 mmol/L (ref 135–145)
Total Bilirubin: 1 mg/dL (ref 0.3–1.2)
Total Protein: 8.3 g/dL — ABNORMAL HIGH (ref 6.5–8.1)

## 2017-03-16 LAB — CBC
HEMATOCRIT: 40.7 % (ref 36.0–46.0)
HEMOGLOBIN: 14.7 g/dL (ref 12.0–15.0)
MCH: 32.7 pg (ref 26.0–34.0)
MCHC: 36.1 g/dL — ABNORMAL HIGH (ref 30.0–36.0)
MCV: 90.4 fL (ref 78.0–100.0)
Platelets: 257 10*3/uL (ref 150–400)
RBC: 4.5 MIL/uL (ref 3.87–5.11)
RDW: 12.7 % (ref 11.5–15.5)
WBC: 6.3 10*3/uL (ref 4.0–10.5)

## 2017-03-16 LAB — RAPID URINE DRUG SCREEN, HOSP PERFORMED
AMPHETAMINES: NOT DETECTED
BARBITURATES: NOT DETECTED
BENZODIAZEPINES: NOT DETECTED
COCAINE: NOT DETECTED
Opiates: NOT DETECTED
TETRAHYDROCANNABINOL: POSITIVE — AB

## 2017-03-16 LAB — ETHANOL: Alcohol, Ethyl (B): <10 mg/dL (ref <10)

## 2017-03-16 LAB — ACETAMINOPHEN LEVEL

## 2017-03-16 LAB — SALICYLATE LEVEL: Salicylate Lvl: <7 mg/dL (ref 2.8–30.0)

## 2017-03-16 LAB — PREGNANCY, URINE: Preg Test, Ur: NEGATIVE

## 2017-03-16 MED ORDER — STERILE WATER FOR INJECTION IJ SOLN
INTRAMUSCULAR | Status: AC
  Administered 2017-03-16: 10 mL
  Filled 2017-03-16: qty 10

## 2017-03-16 MED ORDER — LORAZEPAM 2 MG/ML IJ SOLN
2.0000 mg | Freq: Once | INTRAMUSCULAR | Status: AC
Start: 2017-03-16 — End: 2017-03-16
  Administered 2017-03-16: 2 mg via INTRAMUSCULAR
  Filled 2017-03-16: qty 1

## 2017-03-16 MED ORDER — ZIPRASIDONE MESYLATE 20 MG IM SOLR
20.0000 mg | Freq: Once | INTRAMUSCULAR | Status: AC
  Administered 2017-03-16: 20 mg via INTRAMUSCULAR
  Filled 2017-03-16: qty 20
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
## 2017-03-16 NOTE — ED Notes (Signed)
Patient was yelling and hollering in the lobby wanting to be seen, intermittently given other visitors her family member number to call. Patient is alert, oriented x 3 and no obvious distress. Talking in full sentences with no complications. Informed patient about the wait.

## 2017-03-16 NOTE — ED Triage Notes (Addendum)
[name] stated that the [facility] sent orders that she could return if medically cleared.
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
## 2017-03-16 NOTE — ED Notes (Signed)
DR Effie ShyWentz at bedside to evaluate pt.

## 2017-03-16 NOTE — ED Notes (Addendum)
Pt transported back to the TCU area with ED staff as well as security and [name]. Pt yelling and calling out that "but why. This is crazy. For real." when told that she is to be transferred into a pt room. Unable to reason with pt and with much coaching pt is changed into paper scrubs and belongings placed into locker number 28. Pt with a backpack that is at the nurse's station as it will not fit in locker. While changing pt into scrubs, pt continues to yell and periodically is scratching her perineum and sniffing her fingers. Attempted to explain to pt however unable to reason with pt.

## 2017-03-16 NOTE — ED Notes (Addendum)
Pt is in lobby in a wheelchair bent over with her arm over her face yelling stating that she needs help. Tech went to check on Pt in the lobby and Pt refuses to cooperate and is hollering. Tech tried to ask Pt what was wrong and she just said that she is having pain. Pt refuses to point to where she is having pain, and just keeps stating that she is scared and that she can't. Tried to help Pt but cannot as she will not let me.

## 2017-03-17 MED ORDER — AZITHROMYCIN 250 MG PO TABS
1000.0000 mg | ORAL_TABLET | Freq: Once | ORAL | Status: AC
  Administered 2017-03-17: 1000 mg via ORAL
  Filled 2017-03-17: qty 4

## 2017-03-17 MED ORDER — STERILE WATER FOR INJECTION IJ SOLN
INTRAMUSCULAR | Status: AC
  Administered 2017-03-17: 10 mL
  Filled 2017-03-17: qty 10

## 2017-03-17 MED ORDER — ONDANSETRON 8 MG PO TBDP
8.0000 mg | ORAL_TABLET | Freq: Once | ORAL | Status: AC
Start: 2017-03-17 — End: 2017-03-17
  Administered 2017-03-17: 8 mg via ORAL
  Filled 2017-03-17: qty 1

## 2017-03-17 MED ORDER — METRONIDAZOLE IVPB CUSTOM: 2000.0000 mg | Freq: Once | INTRAVENOUS | Status: DC

## 2017-03-17 MED ORDER — NAPROXEN 500 MG PO TABS
500.0000 mg | ORAL_TABLET | Freq: Once | ORAL | Status: AC
Start: 2017-03-17 — End: 2017-03-17
  Administered 2017-03-17: 500 mg via ORAL
  Filled 2017-03-17: qty 1

## 2017-03-17 MED ORDER — METRONIDAZOLE 500 MG PO TABS
1000.0000 mg | ORAL_TABLET | Freq: Once | ORAL | Status: AC
  Administered 2017-03-17: 1000 mg via ORAL
  Filled 2017-03-17: qty 2

## 2017-03-17 MED ORDER — CEFTRIAXONE SODIUM 250 MG IJ SOLR
250.0000 mg | Freq: Once | INTRAMUSCULAR | Status: AC
  Administered 2017-03-17: 250 mg via INTRAMUSCULAR
  Filled 2017-03-17: qty 250

## 2017-03-17 NOTE — ED Notes (Addendum)
Patient is currently under IVC by medical dr Effie ShyWentz. [facility] worker if [employer]. Tom stated that [facility] at [facility] reported that [facility] can return. [facility] Social worker consulted.
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
## 2017-03-17 NOTE — Progress Notes (Signed)
We have verified her home meds the best we can. She is a poor historian. Since the ACT team stopped serving her she has had poor compliance with any therapy. She has the Saphris medication in her possession.   Charolotte Ekeom Adir Schicker, PharmD. Mobile: [phone]. 03/17/2017,1:39 PM.

## 2017-03-17 NOTE — ED Provider Notes (Addendum)
SANE nurse evaluation has been completed.  Patient has requested not to get STD testing done, and she has Mirena in place.  Patient will follow up with her primary doctor for further reproductive health needs.  Patient is requesting to be treated empirically for STD exposures, and SANE nurse have recommended that we give patient GC, chlamydia, trichomonas treatment in the ER.  Patient is cleared from SANE nurse perspective.   Derwood KaplanNanavati, Dovey Fatzinger, MD 03/17/17 1209  1:59 PM  [facility].  IVC paperwork will be rescinded.  Patient is now alert and oriented x3.  Mother at bedside.  I [facility] with [facility], and they will be expecting the patient to stop by either later today or tomorrow. Mother reports that patient has multiple psychiatry conditions, patient reports that she has been taking her medications as prescribed.  [facility].   Derwood KaplanNanavati, Kionna Brier, MD 03/17/17 [phone]

## 2017-03-17 NOTE — ED Notes (Signed)
Patient seen by SANE nurse

## 2017-03-17 NOTE — BH Assessment (Signed)
[facility] Assessment Progress Note  At 12:59 this Clinical research associatewriter called [facility] [phone]) and spoke to Jennette Kettleich Stepnowski, RN.  He reports that they are still willing to accept pt back, even though she is now under IVC.  They ask only to know any medical impressions that were found pertinent to the medical clearance that they requested.  This Clinical research associatewriter agreed to ask pt's nurse to call them with these details.  At 13:04 I spoke to pt's nurse, Marcelo Baldyaquita Milner, asking her to call [facility] at the number noted above and to provide Jennette Kettleich Stepnowski with requested information.  She agrees to this.  Doylene Canninghomas Story Conti, [place] Behavioral Health Coordinator [phone]
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
## 2017-07-02 ENCOUNTER — Encounter (HOSPITAL_COMMUNITY): Payer: Self-pay | Admitting: Emergency Medicine

## 2017-07-02 ENCOUNTER — Emergency Department (HOSPITAL_COMMUNITY)
Admission: EM | Admit: 2017-07-02 | Discharge: 2017-07-02 | Disposition: A | Discharge status: Home / Self Care | Payer: Medicaid Other | Attending: Emergency Medicine | Admitting: Emergency Medicine

## 2017-07-02 ENCOUNTER — Other Ambulatory Visit: Payer: Self-pay

## 2017-07-02 DIAGNOSIS — F17210 Nicotine dependence, cigarettes, uncomplicated: Secondary | ICD-10-CM | POA: Insufficient documentation

## 2017-07-02 DIAGNOSIS — Z5900 Homelessness unspecified: Secondary | ICD-10-CM

## 2017-07-02 DIAGNOSIS — Z79899 Other long term (current) drug therapy: Secondary | ICD-10-CM | POA: Diagnosis not present

## 2017-07-02 DIAGNOSIS — M79672 Pain in left foot: Secondary | ICD-10-CM | POA: Insufficient documentation

## 2017-07-02 DIAGNOSIS — M79671 Pain in right foot: Secondary | ICD-10-CM | POA: Insufficient documentation

## 2017-07-02 MED ORDER — ACETAMINOPHEN 500 MG PO TABS
1000.0000 mg | ORAL_TABLET | Freq: Once | ORAL | Status: AC
  Administered 2017-07-02: 1000 mg via ORAL
  Filled 2017-07-02: qty 2

## 2017-07-02 NOTE — ED Provider Notes (Signed)
[facility]-EMERGENCY DEPT Provider Note   CSN: [id number] Arrival date & time: 07/02/17  0224     History   Chief Complaint Chief Complaint  Patient presents with  . Foot Pain    HPI Betty Parrish is a 27 y.o. female.  Betty Parrish is a 27 y.o. Female with history of ADHD, anxiety, schizoaffective disorder and sickle cell trait, who presents to the ED via EMS from complaining of bilateral foot pain.  Patient reports she has been walking constantly for several days in flip-flops and the bottoms of her feet are hurting and she is concerned she has blisters.  Denies any redness or drainage, she has been walking around without difficulty and reports primarily her feet are just sore, the only shoes she has are flip flops.  She reports she has been having some occasional temporal headaches this is improved.  She also reports she currently has nowhere to go, she reports "she has mental health issues", patient has gone to [facility] in the past to obtain her medications.  She denies any SI, HI or AVH today, and is just requesting resources and help.  She denies any fevers or chills, chest pain, shortness of breath or abdominal pain, no neck pain or stiffness, vomiting, numbness, weakness or tingling in her extremities.     Past Medical History:  Diagnosis Date  . ADHD (attention deficit hyperactivity disorder)   . Anxiety   . Heart murmur   . Schizo-affective psychosis (HCC)   . Sickle cell trait [facility])     Patient Active Problem List   Diagnosis Date Noted  . Cannabis abuse   . Bipolar disorder, manic phase (HCC) 09/30/2014  . Schizoaffective disorder, bipolar type (HCC) 09/29/2014  . Mania (HCC) 12/18/2012    History reviewed. No pertinent surgical history.   OB History   None      Home Medications    Prior to Admission medications   Medication Sig Start Date End Date Taking? Authorizing Provider  acetaminophen (TYLENOL) 500 MG tablet Take 1,000 mg by mouth  every 6 (six) hours as needed for mild pain or headache.    [provider]  Asenapine Maleate 10 MG SUBL Place 10 mg under the tongue 2 (two) times daily as needed for agitation. 01/21/16   [provider]  divalproex (DEPAKOTE ER) 250 MG 24 hr tablet Take 250 mg by mouth 2 (two) times daily as needed (mood stabilization).     [provider]  ibuprofen (ADVIL,MOTRIN) 800 MG tablet Take 1 tablet (800 mg total) by mouth 3 (three) times daily. 03/13/17   Arby Barrette, MD    Family History Family History  Problem Relation Age of Onset  . Sickle cell anemia Mother     Social History Social History   Tobacco Use  . Smoking status: Current Every Day Smoker    Packs/day: 1.00    Types: Cigarettes  . Smokeless tobacco: Never Used  Substance Use Topics  . Alcohol use: No    Frequency: Never  . Drug use: Yes    Types: Marijuana, Cocaine    Comment: last used this morning     Allergies   Red dye and Zyprexa [olanzapine]   Review of Systems Review of Systems  Constitutional: Negative for chills and fever.  HENT: Negative for congestion, rhinorrhea and sore throat.   Eyes: Negative for visual disturbance.  Respiratory: Negative for cough and shortness of breath.   Cardiovascular: Negative for chest pain.  Gastrointestinal:  Negative for abdominal pain, diarrhea, nausea and vomiting.  Genitourinary: Negative for dysuria and frequency.  Musculoskeletal: Positive for arthralgias. Negative for neck pain and neck stiffness.  Skin: Negative for color change and rash.  Neurological: Positive for headaches. Negative for dizziness, weakness, light-headedness and numbness.     Physical Exam Updated Vital Signs BP 121/80 (BP Location: Left Arm)   Pulse 66   Temp 98.3 F (36.8 C) (Oral)   Resp 14   SpO2 97%   Physical Exam  Constitutional: She is oriented to person, place, and time. She appears well-developed and well-nourished. No distress.  HENT:    Head: Normocephalic and atraumatic.  Mouth/Throat: Oropharynx is clear and moist.  Eyes: Pupils are equal, round, and reactive to light. EOM are normal. Right eye exhibits no discharge. Left eye exhibits no discharge.  Neck: Neck supple.  Cardiovascular: Normal rate, regular rhythm, normal heart sounds and intact distal pulses.  Pulses:      Radial pulses are 2+ on the right side, and 2+ on the left side.       Dorsalis pedis pulses are 2+ on the right side, and 2+ on the left side.       Posterior tibial pulses are 2+ on the right side, and 2+ on the left side.  Pulmonary/Chest: Effort normal and breath sounds normal. No stridor. No respiratory distress. She has no wheezes. She has no rales.  Respirations equal and unlabored, patient able to speak in full sentences, lungs clear to auscultation bilaterally  Abdominal: Soft. Bowel sounds are normal. She exhibits no distension and no mass. There is no tenderness. There is no guarding.  Musculoskeletal:  Bilateral feet with 2+ DP and TP pulses, 5/5 dorsi and plantar flexion, sensation intact, there is no erythema, swelling or areas of drainage or wounds noted, there are a few callused areas, but no signs of infection.  Patient ambulatory without difficulty. All compartments soft  Neurological: She is alert and oriented to person, place, and time. Coordination normal.  Speech is clear, able to follow commands CN III-XII intact Normal strength in upper and lower extremities bilaterally including dorsiflexion and plantar flexion, strong and equal grip strength Sensation normal to light and sharp touch Moves extremities without ataxia, coordination intact  Skin: Skin is warm and dry. Capillary refill takes less than 2 seconds. She is not diaphoretic.  Psychiatric: She has a normal mood and affect. Her behavior is normal.  Nursing note and vitals reviewed.    ED Treatments / Results  Labs (all labs ordered are listed, but only abnormal results  are displayed) Labs Reviewed - No data to display  EKG None  Radiology No results found.  Procedures Procedures (including critical care time)  Medications Ordered in ED Medications  acetaminophen (TYLENOL) tablet 1,000 mg (has no administration in time range)     Initial Impression / Assessment and Plan / ED Course  I have reviewed the triage vital signs and the nursing notes.  Pertinent labs & imaging results that were available during my care of the patient were reviewed by me and considered in my medical decision making (see chart for details).  Patient presents complaining of bilateral foot pain, no injury patient has just been walking on them for several days and flip-flops, patient expresses concern for blisters, denies any redness, drainage or swelling.  Bilateral feet without erythema, swelling, wounds, drainage.  Pulses are intact, no concern for infection, no bony tenderness to suggest fracture or injury.  Ambulatory without  difficulty.  Patient does have some calluses on her feet. Patient also complaining of intermittent headache, no red flag symptoms, normal neurologic exam.  Will treat with Tylenol, encourage patient to try and get off of her feet some and elevate them.  Patient reports she does not have anywhere to go and is requesting help with resources for shelter and mental illness.  I did patient resources on outpatient counseling and shoulders, encourage [facility].  She denies any SI, HI or AVH, appears to be exhibiting normal affect, behavior and judgement.   At this time there does not appear to be any evidence of an acute emergency medical condition and the patient appears stable for discharge with appropriate outpatient follow up.Diagnosis was discussed with patient who verbalizes understanding and is agreeable to discharge.   Final Clinical Impressions(s) / ED Diagnoses   Final diagnoses:  Bilateral  foot pain  Homelessness    ED Discharge Orders    None       Dartha LodgeFord, Arayna Illescas N, [place]PA-C 07/02/17 0645    Ward, Layla MawKristen N, DO 07/02/17 [phone]

## 2017-07-02 NOTE — ED Notes (Signed)
Pt provided grahams, pb & ginger ale.

## 2017-07-02 NOTE — ED Notes (Signed)
Pt stated "I'm homeless.  My mother kicked me out 2 weeks ago because I quit my job."

## 2017-07-02 NOTE — Discharge Instructions (Signed)
You may use Tylenol or ibuprofen as needed for pain in your feet, there is no evidence of blisters, just calluses from you being on your feet frequently, trying to find some more supportive shoes will be helpful for this.  Try and get off of your feet for a little while.  Help with your mental health and resources you [facility] as well as [facility] who has walk-in hours during the day.  Also provided resources on counseling and shelters here today.  Return to the ED if you notice redness, swelling, wounds on your feet, or having trouble walking, numbness or tingling certainly other new symptoms there, or if you begin having any thoughts of harming herself or harming others.

## 2017-07-02 NOTE — ED Triage Notes (Signed)
Pt brought in by EMS for c/o bilateral foot pain  Pt has been walking all day  Pt is also c/o headache  Pt has hx of mental health issues  Pt having a difficult time staying awake during the triage process

## 2017-08-29 ENCOUNTER — Other Ambulatory Visit: Payer: Self-pay

## 2017-08-29 ENCOUNTER — Emergency Department (HOSPITAL_COMMUNITY)
Admission: EM | Admit: 2017-08-29 | Discharge: 2017-08-30 | Disposition: A | Discharge status: Home / Self Care | Payer: Medicaid Other | Attending: Emergency Medicine | Admitting: Emergency Medicine

## 2017-08-29 ENCOUNTER — Encounter (HOSPITAL_COMMUNITY): Payer: Self-pay | Admitting: Emergency Medicine

## 2017-08-29 DIAGNOSIS — F17210 Nicotine dependence, cigarettes, uncomplicated: Secondary | ICD-10-CM | POA: Insufficient documentation

## 2017-08-29 DIAGNOSIS — F250 Schizoaffective disorder, bipolar type: Secondary | ICD-10-CM | POA: Insufficient documentation

## 2017-08-29 DIAGNOSIS — R45851 Suicidal ideations: Secondary | ICD-10-CM | POA: Insufficient documentation

## 2017-08-29 MED ORDER — LORAZEPAM 1 MG PO TABS
2.0000 mg | ORAL_TABLET | Freq: Once | ORAL | Status: AC
  Administered 2017-08-29: 2 mg via ORAL
  Filled 2017-08-29: qty 2

## 2017-08-29 NOTE — ED Notes (Signed)
Pt started walking out front door. Nurse asked pt if she was ok, pt did not respond and continued to walk out to lobby.

## 2017-08-29 NOTE — ED Notes (Signed)
Bed: [facility]3 Expected date:  Expected time:  Means of arrival:  Comments: 

## 2017-08-29 NOTE — ED Notes (Signed)
Pt had to go to the bathroom. 2 hats were set up in the toilet in order to collect urine. After the pt emerged from the bathroom, the two hats were not full of urine but had small amounts of yellow liquid in them. It seemed that she had dumped the urine in the toilet.

## 2017-08-29 NOTE — ED Triage Notes (Signed)
Pt arriving due to police being called. Pt seems to be having psychotic break. Pt refuses to speak when nurse is asking why she is here or what issues she is having. Pt only using hand gestures which staff is unable to determine what they mean. Only thing pt will say is "Don't make me angry."

## 2017-08-29 NOTE — ED Notes (Signed)
Security found pt outside and convinced her to return for assessment

## 2017-08-29 NOTE — ED Provider Notes (Signed)
[facility]-EMERGENCY DEPT Provider Note   CSN: [id number] Arrival date & time: 08/29/17  2045     History   Chief Complaint Chief Complaint  Patient presents with  . Medical Clearance    HPI Betty Parrish is a 27 y.o. female with a history of sickle cell trait, schizoaffective psychosis, anxiety, homelessness, and ADHD who presents to the emergency department by [name] with a chief complaint of " I don't want to be here."  Nursing staff notes that please were called because the patient was thought to be having a psychotic break.  When I examined the patient, the police who accompany the patient to the ED are no longer here, and she is accompanied by a police officer from the ED.  She states that she has all of her medications at home and has been taking them, but she has not followed up to have her injection.  She will not say what the name of the medication is.  Police from the ED who have been sitting with the patient reports that she said that she is looking for a diamond necklace.  The patient has been on request to find the diamond necklace because if she does not find it demons will come out of her and all of the black people in the world will die."  On re-evaluation, the patient reports that she was previously discharged from her act team approximately 1.5 years ago.  She states that she has been trying to get set up with [facility], but has been unable to get any help.  She reports that she wants to get restarted on her injections.  The history is provided by the patient. No language interpreter was used.    Past Medical History:  Diagnosis Date  . ADHD (attention deficit hyperactivity disorder)   . Anxiety   . Heart murmur   . Schizo-affective psychosis (HCC)   . Sickle cell trait [facility])     Patient Active Problem List   Diagnosis Date Noted  . Cannabis abuse   . Bipolar disorder, manic phase (HCC) 09/30/2014  . Schizoaffective disorder, bipolar type  (HCC) 09/29/2014  . Mania (HCC) 12/18/2012    History reviewed. No pertinent surgical history.   OB History   None      Home Medications    Prior to Admission medications   Medication Sig Start Date End Date Taking? Authorizing Provider  acetaminophen (TYLENOL) 500 MG tablet Take 1,000 mg by mouth every 6 (six) hours as needed for mild pain or headache.    [provider]  Asenapine Maleate 10 MG SUBL Place 10 mg under the tongue 2 (two) times daily as needed for agitation. 01/21/16   [provider]  divalproex (DEPAKOTE ER) 250 MG 24 hr tablet Take 250 mg by mouth 2 (two) times daily as needed (mood stabilization).     [provider]  ibuprofen (ADVIL,MOTRIN) 800 MG tablet Take 1 tablet (800 mg total) by mouth 3 (three) times daily. 03/13/17   Arby Barrette, MD    Family History Family History  Problem Relation Age of Onset  . Sickle cell anemia Mother     Social History Social History   Tobacco Use  . Smoking status: Current Every Day Smoker    Packs/day: 1.00    Types: Cigarettes  . Smokeless tobacco: Never Used  Substance Use Topics  . Alcohol use: No    Frequency: Never  . Drug use: Yes    Types: Marijuana,  Cocaine    Comment: last used this morning     Allergies   Red dye and Zyprexa [olanzapine]   Review of Systems Review of Systems  Constitutional: Negative for activity change.  Respiratory: Negative for shortness of breath.   Cardiovascular: Negative for chest pain.  Gastrointestinal: Negative for abdominal pain.  Genitourinary: Negative for dysuria.  Musculoskeletal: Negative for back pain.  Skin: Negative for rash.  Allergic/Immunologic: Negative for immunocompromised state.  Neurological: Negative for headaches.  Psychiatric/Behavioral: Negative for confusion.     Physical Exam Updated Vital Signs BP 124/89 (BP Location: Left Arm)   Pulse (!) 110   Temp 98.1 F (36.7 C) (Oral)   Resp 18   SpO2 98%    Physical Exam  Constitutional: No distress.  HENT:  Head: Normocephalic.  Eyes: Conjunctivae are normal.  Neck: Neck supple.  Cardiovascular: Normal rate, regular rhythm, normal heart sounds and intact distal pulses. Exam reveals no gallop and no friction rub.  No murmur heard. Pulmonary/Chest: Effort normal and breath sounds normal. No stridor. No respiratory distress. She has no wheezes. She has no rales. She exhibits no tenderness.  Abdominal: Soft. Bowel sounds are normal. She exhibits no distension and no mass. There is no tenderness. There is no rebound and no guarding. No hernia.  Musculoskeletal: She exhibits tenderness.  There is a superficial abrasion noted to the flexor surface of the left elbow.  She is mildly tender to palpation in the surrounding area.  No surrounding erythema, edema, or warmth.  Full active and passive range of motion of the left wrist, elbow, and shoulder.  Radial pulses are 2+ and symmetric.  Sensation is intact throughout.  Neurological: She is alert.  Skin: Skin is warm. No rash noted.  Psychiatric: Her affect is blunt. Her speech is tangential. She is agitated. Thought content is delusional. She expresses impulsivity.  Nursing note and vitals reviewed.  ED Treatments / Results  Labs (all labs ordered are listed, but only abnormal results are displayed) Labs Reviewed  CBC WITH DIFFERENTIAL/PLATELET  BASIC METABOLIC PANEL  ACETAMINOPHEN LEVEL  SALICYLATE LEVEL  ETHANOL  RAPID URINE DRUG SCREEN, HOSP PERFORMED    EKG None  Radiology No results found.  Procedures Procedures (including critical care time)  Medications Ordered in ED Medications  acetaminophen (TYLENOL) tablet 650 mg (has no administration in time range)  LORazepam (ATIVAN) tablet 2 mg (2 mg Oral Given 08/29/17 2347)     Initial Impression / Assessment and Plan / ED Course  I have reviewed the triage vital signs and the nursing notes.  Pertinent labs & imaging results  that were available during my care of the patient were reviewed by me and considered in my medical decision making (see chart for details).     27 year old female with a history of sickle cell trait, schizoaffective psychosis, anxiety, and ADHD who presents to the emergency department by [name] with a chief complaint of " I don't want to be here."  The patient is exhibiting erratic behavior and appears to be having a psychotic break.  After finishing my initial examination, the patient eloped from the ED, but returned voluntarily.  She was seen by Dr. Madilyn Hook, attending physician, who agreed that IVC paperwork should be taken out.  2 mg of Ativan given.    On reexamination, the patient is much less agitated and more is more cooperative.  She reports that she wants to get reestablished with her ACT team and get restarted on her previous medications.  Pt medically cleared at this time. Psych hold orders and home med orders placed. TTS consult pending; please see psych team notes for further documentation of care/dispo. Pt stable at time of med clearance.    Final Clinical Impressions(s) / ED Diagnoses   Final diagnoses:  None    ED Discharge Orders    None       Barkley BoardsMcDonald, Mia A, PA-C 08/30/17 0012    Tilden Fossaees, Elizabeth, MD 09/08/17 [phone]

## 2017-08-30 LAB — BASIC METABOLIC PANEL
Anion gap: 9 (ref 5–15)
BUN: 7 mg/dL (ref 6–20)
CHLORIDE: 104 mmol/L (ref 101–111)
CO2: 26 mmol/L (ref 22–32)
Calcium: 9.6 mg/dL (ref 8.9–10.3)
Creatinine, Ser: 0.62 mg/dL (ref 0.44–1.00)
GFR calc Af Amer: >60 mL/min (ref >60)
GFR calc non Af Amer: >60 mL/min (ref >60)
GLUCOSE: 103 mg/dL — AB (ref 65–99)
POTASSIUM: 2.9 mmol/L — AB (ref 3.5–5.1)
Sodium: 139 mmol/L (ref 135–145)

## 2017-08-30 LAB — CBC WITH DIFFERENTIAL/PLATELET
Basophils Absolute: 0 10*3/uL (ref 0.0–0.1)
Basophils Relative: 0 %
Eosinophils Absolute: 0.2 10*3/uL (ref 0.0–0.7)
Eosinophils Relative: 3 %
HCT: 38.9 % (ref 36.0–46.0)
HEMOGLOBIN: 13.6 g/dL (ref 12.0–15.0)
LYMPHS ABS: 2.2 10*3/uL (ref 0.7–4.0)
LYMPHS PCT: 34 %
MCH: 32.2 pg (ref 26.0–34.0)
MCHC: 35 g/dL (ref 30.0–36.0)
MCV: 92 fL (ref 78.0–100.0)
MONOS PCT: 12 %
Monocytes Absolute: 0.8 10*3/uL (ref 0.1–1.0)
NEUTROS PCT: 51 %
Neutro Abs: 3.3 10*3/uL (ref 1.7–7.7)
Platelets: 284 10*3/uL (ref 150–400)
RBC: 4.23 MIL/uL (ref 3.87–5.11)
RDW: 13 % (ref 11.5–15.5)
WBC: 6.5 10*3/uL (ref 4.0–10.5)

## 2017-08-30 LAB — ACETAMINOPHEN LEVEL: Acetaminophen (Tylenol), Serum: <10 ug/mL — ABNORMAL LOW (ref 10–30)

## 2017-08-30 LAB — RAPID URINE DRUG SCREEN, HOSP PERFORMED
AMPHETAMINES: NOT DETECTED
BENZODIAZEPINES: POSITIVE — AB
COCAINE: NOT DETECTED
OPIATES: NOT DETECTED
TETRAHYDROCANNABINOL: POSITIVE — AB

## 2017-08-30 LAB — ETHANOL: Alcohol, Ethyl (B): <10 mg/dL (ref <10)

## 2017-08-30 LAB — SALICYLATE LEVEL: Salicylate Lvl: <7 mg/dL (ref 2.8–30.0)

## 2017-08-30 MED ORDER — ACETAMINOPHEN 325 MG PO TABS
650.0000 mg | ORAL_TABLET | Freq: Once | ORAL | Status: AC
  Administered 2017-08-30: 650 mg via ORAL
  Filled 2017-08-30: qty 2

## 2017-08-30 MED ORDER — HYDROXYZINE HCL 25 MG PO TABS: 25.0000 mg | ORAL_TABLET | Freq: Two times a day (BID) | ORAL | 0 refills | Status: DC

## 2017-08-30 MED ORDER — HALOPERIDOL 5 MG PO TABS: 5.0000 mg | ORAL_TABLET | Freq: Two times a day (BID) | ORAL | 0 refills | Status: DC

## 2017-08-30 MED ORDER — HALOPERIDOL 5 MG PO TABS: 5.0000 mg | ORAL_TABLET | Freq: Two times a day (BID) | ORAL | Status: DC

## 2017-08-30 MED ORDER — ARIPIPRAZOLE 10 MG PO TABS: 10.0000 mg | ORAL_TABLET | Freq: Every day | ORAL | Status: DC

## 2017-08-30 MED ORDER — HALOPERIDOL 5 MG PO TABS
5.0000 mg | ORAL_TABLET | Freq: Two times a day (BID) | ORAL | Status: DC
  Administered 2017-08-30: 5 mg via ORAL
  Filled 2017-08-30: qty 1

## 2017-08-30 MED ORDER — HYDROXYZINE HCL 25 MG PO TABS: 25.0000 mg | ORAL_TABLET | Freq: Two times a day (BID) | ORAL | Status: DC

## 2017-08-30 NOTE — ED Notes (Signed)
Patient resting at this time patient received 2mg  of ativan

## 2017-08-30 NOTE — BH Assessment (Signed)
[facility]    Per Dr Jannifer FranklinAkintayo, patient does not meet admission and can return home and follow-up with her outpatient provider[facility]
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
## 2017-08-30 NOTE — Discharge Instructions (Signed)
For your ongoing mental health needs, you are advised to follow up with [facility].  New and returning patients are seen at their walk-in clinic.  Walk-in hours are Monday - Friday from 8:00 am - 3:00 pm.  Walk-in patients are seen on a first come, first served basis.  Try to arrive as early as possible for he best chance of being seen the same day: ° °     [facility] °     [address] °     [place], [place] [address] °     [phone] °

## 2017-08-30 NOTE — ED Notes (Signed)
Patient denies SI/HI/AVH and states that she just wants to be left alone. Encouragement and  Support provided and safety maintain. Q 15 min safety checks in place and video monitoring.

## 2017-08-30 NOTE — ED Notes (Signed)
Pt discharged home. Discharged instructions read to pt who verbalized understanding. All belongings returned to pt who signed for same. Denies SI/HI, is not delusional and not responding to internal stimuli. Escorted pt to the ED exit.    

## 2017-08-30 NOTE — [facility] Suicide Risk Assessment (Cosign Needed)
Suicide Risk Assessment  Discharge Assessment   [facility]   Principal Problem: Schizoaffective disorder, bipolar type [facility](HCC) Discharge Diagnoses:  Patient Active Problem List   Diagnosis Date Noted  . Cannabis abuse [F12.10]   . Bipolar disorder, manic phase (HCC) [F31.10] 09/30/2014  . Schizoaffective disorder, bipolar type (HCC) [F25.0] 09/29/2014  . Mania (HCC) [F30.9] 12/18/2012    Pt denies suicidal/homicidal ideation, denies auditory/visual hallucinations and does not appear to be responding to internal stimuli. Pt stated she lives alone and wants to be discharged. Pt remembers saying that "all black people will die if I don't find that diamond necklace." Pt stated she knows this is not true and did not mean any harm was going to come  To anyone, she was just upset. Pt is able to contract for safety upon discharge. Pt is stable and psychiatrically clear for discharge.   Total Time spent with patient: 30 minutes  Musculoskeletal: Strength & Muscle Tone: within normal limits Gait & Station: normal Patient leans: N/A  Psychiatric Specialty Exam:   Blood pressure 124/89, pulse (!) 110, temperature 98.1 F (36.7 C), temperature source Oral, resp. rate 18, SpO2 98 %.There is no height or weight on file to calculate BMI.  General Appearance: Casual  Eye Contact::  Fair  Speech:  Slurred409  Volume:  Normal  Mood:  Irritable  Affect:  Congruent  Thought Process:  Coherent, Goal Directed and Linear  Orientation:  Full (Time, Place, and Person)  Thought Content:  Logical  Suicidal Thoughts:  No  Homicidal Thoughts:  No  Memory:  Immediate;   Good Recent;   Good Remote;   Fair  Judgement:  Fair  Insight:  Fair  Psychomotor Activity:  Normal  Concentration:  Fair  Recall:  FiservFair  Fund of Knowledge:Good  Language: Good  Akathisia:  No  Handed:  Right  AIMS (if indicated):     Assets:  Medical laboratory scientific officerCommunication Skills Financial  Resources/Insurance Housing Social Support  Sleep:     Cognition: WNL  ADL's:  Intact   Mental Status Per Nursing Assessment::   On Admission:   agitated  Demographic Factors:  Adolescent or young adult, Low socioeconomic status and Living alone  Loss Factors: Financial problems/change in socioeconomic status  Historical Factors: Impulsivity  Risk Reduction Factors:   Sense of responsibility to family  Continued Clinical Symptoms:  Bipolar Disorder:   Mixed State  Cognitive Features That Contribute To Risk:  Closed-mindedness    Suicide Risk:  Minimal: No identifiable suicidal ideation.  Patients presenting with no risk factors but with morbid ruminations; may be classified as minimal risk based on the severity of the depressive symptoms    Plan Of Care/Follow-up recommendations:  Activity:  as tolerated Diet:  Heart healthy  Laveda AbbeLaurie Britton Doreather Hoxworth, NP 08/30/2017, 10:37 AM

## 2017-09-03 ENCOUNTER — Other Ambulatory Visit: Payer: Self-pay

## 2017-09-03 ENCOUNTER — Encounter (HOSPITAL_COMMUNITY): Payer: Self-pay | Admitting: Emergency Medicine

## 2017-09-03 ENCOUNTER — Emergency Department (HOSPITAL_COMMUNITY)
Admission: EM | Admit: 2017-09-03 | Discharge: 2017-09-03 | Disposition: A | Discharge status: Home / Self Care | Payer: Medicaid Other | Attending: Emergency Medicine | Admitting: Emergency Medicine

## 2017-09-03 DIAGNOSIS — M549 Dorsalgia, unspecified: Secondary | ICD-10-CM | POA: Diagnosis not present

## 2017-09-03 DIAGNOSIS — Z5321 Procedure and treatment not carried out due to patient leaving prior to being seen by health care provider: Secondary | ICD-10-CM | POA: Insufficient documentation

## 2017-09-03 LAB — POC URINE PREG, ED: PREG TEST UR: NEGATIVE
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
## 2017-09-03 NOTE — ED Notes (Signed)
Attempted to locate patient in all areas of lobby and restroom as well as outside with no response.  Nurse 1st and triage nurse notified.
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
## 2017-09-03 NOTE — ED Notes (Signed)
Patient called x 2 with no response. 

## 2017-09-04 ENCOUNTER — Emergency Department (HOSPITAL_COMMUNITY)
Admission: EM | Admit: 2017-09-04 | Discharge: 2017-09-04 | Disposition: A | Discharge status: Home / Self Care | Payer: Medicaid Other | Attending: Emergency Medicine | Admitting: Emergency Medicine

## 2017-09-04 ENCOUNTER — Other Ambulatory Visit: Payer: Self-pay

## 2017-09-04 ENCOUNTER — Encounter (HOSPITAL_COMMUNITY): Payer: Self-pay | Admitting: Emergency Medicine

## 2017-09-04 DIAGNOSIS — M791 Myalgia, unspecified site: Secondary | ICD-10-CM

## 2017-09-04 DIAGNOSIS — Y939 Activity, unspecified: Secondary | ICD-10-CM | POA: Insufficient documentation

## 2017-09-04 DIAGNOSIS — Y929 Unspecified place or not applicable: Secondary | ICD-10-CM | POA: Insufficient documentation

## 2017-09-04 DIAGNOSIS — Y999 Unspecified external cause status: Secondary | ICD-10-CM | POA: Diagnosis not present

## 2017-09-04 DIAGNOSIS — W010XXA Fall on same level from slipping, tripping and stumbling without subsequent striking against object, initial encounter: Secondary | ICD-10-CM | POA: Insufficient documentation

## 2017-09-04 DIAGNOSIS — M79652 Pain in left thigh: Secondary | ICD-10-CM | POA: Insufficient documentation

## 2017-09-04 DIAGNOSIS — Z79899 Other long term (current) drug therapy: Secondary | ICD-10-CM | POA: Diagnosis not present

## 2017-09-04 DIAGNOSIS — F17210 Nicotine dependence, cigarettes, uncomplicated: Secondary | ICD-10-CM | POA: Diagnosis not present

## 2017-09-04 MED ORDER — IBUPROFEN 800 MG PO TABS
800.0000 mg | ORAL_TABLET | Freq: Once | ORAL | Status: AC
  Administered 2017-09-04: 800 mg via ORAL
  Filled 2017-09-04: qty 1

## 2017-09-04 MED ORDER — LIDOCAINE 5 % EX PTCH: 1.0000 | MEDICATED_PATCH | CUTANEOUS | 0 refills | Status: DC

## 2017-09-04 MED ORDER — LIDOCAINE 5 % EX PTCH
1.0000 | MEDICATED_PATCH | CUTANEOUS | Status: DC
  Administered 2017-09-04: 1 via TRANSDERMAL
  Filled 2017-09-04: qty 1
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
## 2018-01-12 ENCOUNTER — Other Ambulatory Visit: Payer: Self-pay

## 2018-01-12 ENCOUNTER — Encounter (HOSPITAL_COMMUNITY): Payer: Self-pay | Admitting: *Deleted

## 2018-01-12 ENCOUNTER — Emergency Department (HOSPITAL_COMMUNITY)
Admission: EM | Admit: 2018-01-12 | Discharge: 2018-01-12 | Disposition: A | Discharge status: Home / Self Care | Payer: Medicaid Other | Attending: Emergency Medicine | Admitting: Emergency Medicine

## 2018-01-12 DIAGNOSIS — R1084 Generalized abdominal pain: Secondary | ICD-10-CM

## 2018-01-12 DIAGNOSIS — Y829 Unspecified medical devices associated with adverse incidents: Secondary | ICD-10-CM | POA: Insufficient documentation

## 2018-01-12 DIAGNOSIS — F17210 Nicotine dependence, cigarettes, uncomplicated: Secondary | ICD-10-CM | POA: Diagnosis not present

## 2018-01-12 DIAGNOSIS — D573 Sickle-cell trait: Secondary | ICD-10-CM | POA: Diagnosis not present

## 2018-01-12 DIAGNOSIS — T8339XA Other mechanical complication of intrauterine contraceptive device, initial encounter: Secondary | ICD-10-CM | POA: Insufficient documentation

## 2018-01-12 DIAGNOSIS — Z79899 Other long term (current) drug therapy: Secondary | ICD-10-CM | POA: Insufficient documentation

## 2018-01-12 DIAGNOSIS — R1013 Epigastric pain: Secondary | ICD-10-CM | POA: Diagnosis present

## 2018-01-12 DIAGNOSIS — T839XXA Unspecified complication of genitourinary prosthetic device, implant and graft, initial encounter: Secondary | ICD-10-CM

## 2018-01-12 DIAGNOSIS — K5900 Constipation, unspecified: Secondary | ICD-10-CM | POA: Diagnosis not present

## 2018-01-12 HISTORY — DX: Herpesviral infection, unspecified: B00.9

## 2018-01-12 LAB — COMPREHENSIVE METABOLIC PANEL
ALBUMIN: 3.9 g/dL (ref 3.5–5.0)
ALK PHOS: 57 U/L (ref 38–126)
ALT: 10 U/L (ref 0–44)
AST: 15 U/L (ref 15–41)
Anion gap: 5 (ref 5–15)
BUN: 6 mg/dL (ref 6–20)
CALCIUM: 9.2 mg/dL (ref 8.9–10.3)
CO2: 26 mmol/L (ref 22–32)
CREATININE: 0.77 mg/dL (ref 0.44–1.00)
Chloride: 109 mmol/L (ref 98–111)
Glucose, Bld: 95 mg/dL (ref 70–99)
Potassium: 4 mmol/L (ref 3.5–5.1)
SODIUM: 140 mmol/L (ref 135–145)
TOTAL PROTEIN: 6.6 g/dL (ref 6.5–8.1)
Total Bilirubin: 0.9 mg/dL (ref 0.3–1.2)

## 2018-01-12 LAB — I-STAT BETA HCG BLOOD, ED (MC, WL, AP ONLY)

## 2018-01-12 LAB — CBC
HCT: 39.6 % (ref 36.0–46.0)
Hemoglobin: 13.3 g/dL (ref 12.0–15.0)
MCH: 31.5 pg (ref 26.0–34.0)
MCHC: 33.6 g/dL (ref 30.0–36.0)
MCV: 93.8 fL (ref 80.0–100.0)
PLATELETS: 226 10*3/uL (ref 150–400)
RBC: 4.22 MIL/uL (ref 3.87–5.11)
RDW: 12.4 % (ref 11.5–15.5)
WBC: 7.3 10*3/uL (ref 4.0–10.5)
nRBC: 0 % (ref 0.0–0.2)

## 2018-01-12 LAB — LIPASE, BLOOD: LIPASE: 31 U/L (ref 11–51)

## 2018-01-12 MED ORDER — DICYCLOMINE HCL 20 MG PO TABS: 20.0000 mg | ORAL_TABLET | Freq: Two times a day (BID) | ORAL | 0 refills | Status: DC

## 2018-01-12 MED ORDER — POLYETHYLENE GLYCOL 3350 17 GM/SCOOP PO POWD: 17.0000 g | Freq: Two times a day (BID) | ORAL | 0 refills | Status: DC

## 2018-01-12 NOTE — ED Provider Notes (Signed)
[facility] EMERGENCY DEPARTMENT Provider Note   CSN: [id number] Arrival date & time: 01/12/18  4098     History   Chief Complaint Chief Complaint  Patient presents with  . Abdominal Pain    HPI Betty Parrish is a 27 y.o. female  Who presents with Patient is a poor historian. History is limited by the mental capacity of the patient, patient's ability to communicate effectively, and overall poor insight.  Patient states that she has had 1 week of epigastric abdominal pain.  She states the pain is worse "when she picks hairs off of her stomach."  She has had some nausea and states that she has been vomiting "too many times to count."  Patient did not have any vomiting yesterday.  She states she does not remember the day before that.  But prior to that she threw up 3 times.  She did have one episode of vomiting this morning.  She complains of epigastric abdominal pain.  She does not think anything else makes it better or worse including food.  She was able to eat [employer] chicken nuggets for lunch and she had exit macaroni and cheese for dinner.  She is currently eating peanuts at the bedside.  She denies a history of surgeries to the abdomen.  She denies lower abdominal pain, urinary symptoms.  She does have a current IUD in place that is 2 years overdue (Mirena IUD inserted in 2012).  She denies flank pain, fevers.  She denies diarrhea.  She has had constipation and had several days without a bowel movement.  She had a small bowel movement yesterday with hard stool.     HPI  Past Medical History:  Diagnosis Date  . ADHD (attention deficit hyperactivity disorder)   . Anxiety   . Heart murmur   . Herpes   . Schizo-affective psychosis (HCC)   . Sickle cell trait [facility])     Patient Active Problem List   Diagnosis Date Noted  . Cannabis abuse   . Bipolar disorder, manic phase (HCC) 09/30/2014  . Schizoaffective disorder, bipolar type (HCC) 09/29/2014  . Mania (HCC)  12/18/2012    History reviewed. No pertinent surgical history.   OB History   None      Home Medications    Prior to Admission medications   Medication Sig Start Date End Date Taking? Authorizing Provider  ARIPiprazole (ABILIFY) 10 MG tablet Take 10 mg by mouth daily.   Yes [provider]  haloperidol (HALDOL) 5 MG tablet Take 1 tablet (5 mg total) by mouth 2 (two) times daily. 08/30/17  Yes Laveda Abbe, NP  hydrOXYzine (ATARAX/VISTARIL) 25 MG tablet Take 1 tablet (25 mg total) by mouth 2 (two) times daily. 08/30/17  Yes Laveda Abbe, NP  ibuprofen (ADVIL,MOTRIN) 800 MG tablet Take 1 tablet (800 mg total) by mouth 3 (three) times daily. Patient taking differently: Take 400-600 mg by mouth every 6 (six) hours as needed for headache or mild pain.  03/13/17  Yes Pfeiffer, Lebron Conners, MD  ketoconazole (NIZORAL) 200 MG tablet Take 200 mg by mouth daily. 12/21/17  Yes [provider]  dicyclomine (BENTYL) 20 MG tablet Take 1 tablet (20 mg total) by mouth 2 (two) times daily. 01/12/18   Furkan Keenum, PA-C  lidocaine (LIDODERM) 5 % Place 1 patch onto the skin daily. Remove & Discard patch within 12 hours or as directed by MD Patient not taking: Reported on 01/12/2018 09/04/17   Nicanor Alcon, April, MD  polyethylene glycol powder (GLYCOLAX/MIRALAX) powder Take 17 g by mouth 2 (two) times daily. Mix 1 capful in 10 oz of fluid and drink 2 times daily 01/12/18   Arthor Captain, PA-C    Family History Family History  Problem Relation Age of Onset  . Sickle cell anemia Mother     Social History Social History   Tobacco Use  . Smoking status: Current Every Day Smoker    Packs/day: 1.00    Types: Cigarettes  . Smokeless tobacco: Never Used  Substance Use Topics  . Alcohol use: No    Frequency: Never  . Drug use: Yes    Types: Marijuana, Cocaine    Comment: last used marijuana yesterday, denies cocaine use     Allergies   Red dye and Zyprexa  [olanzapine]   Review of Systems Review of Systems Ten systems reviewed and are negative for acute change, except as noted in the HPI.    Physical Exam Updated Vital Signs BP 122/86 (BP Location: Right Arm)   Pulse 70   Temp 98.4 F (36.9 C) (Oral)   Resp 16   SpO2 100%   Physical Exam  Constitutional: She is oriented to person, place, and time. She appears well-developed and well-nourished. No distress.  HENT:  Head: Normocephalic and atraumatic.  Eyes: Conjunctivae are normal. No scleral icterus.  Neck: Normal range of motion.  Cardiovascular: Normal rate, regular rhythm and normal heart sounds. Exam reveals no gallop and no friction rub.  No murmur heard. Pulmonary/Chest: Effort normal and breath sounds normal. No respiratory distress.  Abdominal: Soft. Bowel sounds are normal. She exhibits no distension and no mass. There is tenderness (mild diffuse ). There is no guarding.  Neurological: She is alert and oriented to person, place, and time.  Skin: Skin is warm and dry. She is not diaphoretic.  Psychiatric: Her behavior is normal.  Nursing note and vitals reviewed.    ED Treatments / Results  Labs (all labs ordered are listed, but only abnormal results are displayed) Labs Reviewed  LIPASE, BLOOD  COMPREHENSIVE METABOLIC PANEL  CBC  I-STAT BETA HCG BLOOD, ED ([facility], [facility], [facility] ONLY)    EKG None  Radiology No results found.  Procedures Procedures (including critical care time)  Medications Ordered in ED Medications - No data to display   Initial Impression / Assessment and Plan / ED Course  I have reviewed the triage vital signs and the nursing notes.  Pertinent labs & imaging results that were available during my care of the patient were reviewed by me and considered in my medical decision making (see chart for details).     Patient is nontoxic, nonseptic appearing, in no apparent distress.  Patient's pain and other symptoms adequately managed in emergency  department.  Labs, and vitals reviewed.  Patient does not meet the SIRS or Sepsis criteria.  On repeat exam patient does not have a surgical abdomen and there are no peritoneal signs.  No indication of appendicitis, bowel obstruction, bowel perforation, cholecystitis, diverticulitis, PID or ectopic pregnancy. Patient pain likely due to constipation. Will treat with bentyl and miralax. Referral given for IUD removal.   Patient discharged home with symptomatic treatment and given strict instructions for follow-up with their primary care physician.  I have also discussed reasons to return immediately to the ER.  Patient expresses understanding and agrees with plan.     Final Clinical Impressions(s) / ED Diagnoses   Final diagnoses:  Generalized abdominal pain  Constipation, unspecified constipation type  Complication of intrauterine device (IUD), unspecified complication, initial encounter [facility])    ED Discharge Orders         Ordered    dicyclomine (BENTYL) 20 MG tablet  2 times daily     01/12/18 1111    polyethylene glycol powder (GLYCOLAX/MIRALAX) powder  2 times daily     01/12/18 1111           Arthor Captain, PA-C 01/12/18 1719    Linwood Dibbles, MD 01/15/18 [phone]

## 2018-01-12 NOTE — ED Notes (Signed)
Patient verbalizes understanding of discharge instructions. Opportunity for questioning and answers were provided. Armband removed by staff, pt discharged from ED ambulatory.   

## 2018-01-12 NOTE — Discharge Instructions (Signed)

## 2018-01-12 NOTE — ED Triage Notes (Signed)
Pt in c/o of epigastric pain onset x 1 wk, pt states, "it feels like a baby moving around. I spot but have not had a period in a while because I have an IUD that I was suppose to have removed two years ago." pt c/o nausea, reports x 2 vomiting daily in the mornings, pt denies diarrhea, A&O x4

## 2018-01-12 NOTE — ED Notes (Signed)
Pelvic cart set up outside patient room

## 2018-03-04 DIAGNOSIS — F29 Unspecified psychosis not due to a substance or known physiological condition: Secondary | ICD-10-CM | POA: Insufficient documentation

## 2018-03-04 DIAGNOSIS — F250 Schizoaffective disorder, bipolar type: Secondary | ICD-10-CM | POA: Insufficient documentation

## 2018-03-04 DIAGNOSIS — Z79899 Other long term (current) drug therapy: Secondary | ICD-10-CM | POA: Insufficient documentation

## 2018-03-04 DIAGNOSIS — F17210 Nicotine dependence, cigarettes, uncomplicated: Secondary | ICD-10-CM | POA: Insufficient documentation

## 2018-03-05 ENCOUNTER — Other Ambulatory Visit: Payer: Self-pay

## 2018-03-05 ENCOUNTER — Encounter (HOSPITAL_COMMUNITY): Payer: Self-pay

## 2018-03-05 ENCOUNTER — Inpatient Hospital Stay (HOSPITAL_COMMUNITY)
Admission: AD | Admit: 2018-03-05 | Discharge: 2018-03-08 | DRG: 885 | Disposition: A | Discharge status: Home / Self Care | Payer: Medicaid Other | Attending: Psychiatry | Admitting: Psychiatry

## 2018-03-05 ENCOUNTER — Emergency Department (HOSPITAL_COMMUNITY)
Admission: EM | Admit: 2018-03-05 | Discharge: 2018-03-05 | Discharge status: Against Medical Advice | Payer: Medicaid Other | Attending: Emergency Medicine | Admitting: Emergency Medicine

## 2018-03-05 DIAGNOSIS — F250 Schizoaffective disorder, bipolar type: Principal | ICD-10-CM | POA: Diagnosis present

## 2018-03-05 DIAGNOSIS — D573 Sickle-cell trait: Secondary | ICD-10-CM | POA: Diagnosis present

## 2018-03-05 DIAGNOSIS — F17210 Nicotine dependence, cigarettes, uncomplicated: Secondary | ICD-10-CM | POA: Diagnosis present

## 2018-03-05 DIAGNOSIS — F4310 Post-traumatic stress disorder, unspecified: Secondary | ICD-10-CM | POA: Diagnosis present

## 2018-03-05 DIAGNOSIS — G4700 Insomnia, unspecified: Secondary | ICD-10-CM | POA: Diagnosis present

## 2018-03-05 DIAGNOSIS — Z79899 Other long term (current) drug therapy: Secondary | ICD-10-CM

## 2018-03-05 DIAGNOSIS — F419 Anxiety disorder, unspecified: Secondary | ICD-10-CM | POA: Diagnosis present

## 2018-03-05 DIAGNOSIS — Z915 Personal history of self-harm: Secondary | ICD-10-CM

## 2018-03-05 DIAGNOSIS — F1220 Cannabis dependence, uncomplicated: Secondary | ICD-10-CM | POA: Diagnosis present

## 2018-03-05 DIAGNOSIS — F29 Unspecified psychosis not due to a substance or known physiological condition: Secondary | ICD-10-CM

## 2018-03-05 DIAGNOSIS — Z9119 Patient's noncompliance with other medical treatment and regimen: Secondary | ICD-10-CM

## 2018-03-05 DIAGNOSIS — Z832 Family history of diseases of the blood and blood-forming organs and certain disorders involving the immune mechanism: Secondary | ICD-10-CM

## 2018-03-05 DIAGNOSIS — F410 Panic disorder [episodic paroxysmal anxiety] without agoraphobia: Secondary | ICD-10-CM | POA: Diagnosis present

## 2018-03-05 LAB — CBC WITH DIFFERENTIAL/PLATELET
ABS IMMATURE GRANULOCYTES: 0.02 10*3/uL (ref 0.00–0.07)
BASOS ABS: 0 10*3/uL (ref 0.0–0.1)
Basophils Relative: 0 %
EOS ABS: 0.1 10*3/uL (ref 0.0–0.5)
Eosinophils Relative: 1 %
HEMATOCRIT: 39.1 % (ref 36.0–46.0)
HEMOGLOBIN: 13.4 g/dL (ref 12.0–15.0)
IMMATURE GRANULOCYTES: 0 %
LYMPHS ABS: 2.1 10*3/uL (ref 0.7–4.0)
LYMPHS PCT: 29 %
MCH: 31.9 pg (ref 26.0–34.0)
MCHC: 34.3 g/dL (ref 30.0–36.0)
MCV: 93.1 fL (ref 80.0–100.0)
Monocytes Absolute: 0.9 10*3/uL (ref 0.1–1.0)
Monocytes Relative: 12 %
NEUTROS ABS: 4.1 10*3/uL (ref 1.7–7.7)
NEUTROS PCT: 58 %
NRBC: 0 % (ref 0.0–0.2)
Platelets: 237 10*3/uL (ref 150–400)
RBC: 4.2 MIL/uL (ref 3.87–5.11)
RDW: 12.3 % (ref 11.5–15.5)
WBC: 7.3 10*3/uL (ref 4.0–10.5)

## 2018-03-05 LAB — COMPREHENSIVE METABOLIC PANEL
ALBUMIN: 4.6 g/dL (ref 3.5–5.0)
ALT: 19 U/L (ref 0–44)
AST: 19 U/L (ref 15–41)
Alkaline Phosphatase: 51 U/L (ref 38–126)
Anion gap: 12 (ref 5–15)
BILIRUBIN TOTAL: 1.4 mg/dL — AB (ref 0.3–1.2)
BUN: 9 mg/dL (ref 6–20)
CHLORIDE: 105 mmol/L (ref 98–111)
CO2: 23 mmol/L (ref 22–32)
Calcium: 9.2 mg/dL (ref 8.9–10.3)
Creatinine, Ser: 0.83 mg/dL (ref 0.44–1.00)
GFR calc Af Amer: >60 mL/min (ref >60)
GFR calc non Af Amer: >60 mL/min (ref >60)
GLUCOSE: 100 mg/dL — AB (ref 70–99)
Potassium: 3.4 mmol/L — ABNORMAL LOW (ref 3.5–5.1)
Sodium: 140 mmol/L (ref 135–145)
TOTAL PROTEIN: 7.6 g/dL (ref 6.5–8.1)

## 2018-03-05 LAB — ACETAMINOPHEN LEVEL

## 2018-03-05 LAB — RAPID URINE DRUG SCREEN, HOSP PERFORMED
AMPHETAMINES: NOT DETECTED
BARBITURATES: NOT DETECTED
Benzodiazepines: NOT DETECTED
Cocaine: NOT DETECTED
Opiates: NOT DETECTED
Tetrahydrocannabinol: POSITIVE — AB

## 2018-03-05 LAB — PREGNANCY, URINE: Preg Test, Ur: NEGATIVE

## 2018-03-05 LAB — ETHANOL: Alcohol, Ethyl (B): <10 mg/dL (ref <10)

## 2018-03-05 LAB — SALICYLATE LEVEL: Salicylate Lvl: <7 mg/dL (ref 2.8–30.0)

## 2018-03-05 MED ORDER — BENZTROPINE MESYLATE 0.5 MG PO TABS
0.5000 mg | ORAL_TABLET | Freq: Every day | ORAL | Status: DC
  Administered 2018-03-06: 0.5 mg via ORAL
  Filled 2018-03-05 (×4): qty 1

## 2018-03-05 MED ORDER — MAGNESIUM HYDROXIDE 400 MG/5ML PO SUSP: 30.0000 mL | Freq: Every day | ORAL | Status: DC | PRN

## 2018-03-05 MED ORDER — LORAZEPAM 2 MG/ML IJ SOLN: 1.0000 mg | Freq: Four times a day (QID) | INTRAMUSCULAR | Status: DC | PRN

## 2018-03-05 MED ORDER — LORAZEPAM 2 MG/ML IJ SOLN
INTRAMUSCULAR | Status: AC
  Filled 2018-03-05: qty 1

## 2018-03-05 MED ORDER — BENZTROPINE MESYLATE 0.5 MG PO TABS
0.5000 mg | ORAL_TABLET | Freq: Every day | ORAL | Status: DC
  Administered 2018-03-05: 0.5 mg via ORAL
  Filled 2018-03-05: qty 1

## 2018-03-05 MED ORDER — HALOPERIDOL 5 MG PO TABS
5.0000 mg | ORAL_TABLET | Freq: Two times a day (BID) | ORAL | Status: DC
  Administered 2018-03-05: 5 mg via ORAL
  Filled 2018-03-05: qty 1

## 2018-03-05 MED ORDER — ACETAMINOPHEN 325 MG PO TABS
650.0000 mg | ORAL_TABLET | Freq: Four times a day (QID) | ORAL | Status: DC | PRN
  Administered 2018-03-07 – 2018-03-08 (×2): 650 mg via ORAL
  Filled 2018-03-05 (×2): qty 2

## 2018-03-05 MED ORDER — LORAZEPAM 2 MG/ML IJ SOLN
2.0000 mg | Freq: Once | INTRAMUSCULAR | Status: AC
  Administered 2018-03-05: 2 mg via INTRAMUSCULAR

## 2018-03-05 MED ORDER — LORAZEPAM 1 MG PO TABS
1.0000 mg | ORAL_TABLET | Freq: Four times a day (QID) | ORAL | Status: DC | PRN
  Administered 2018-03-06 – 2018-03-07 (×2): 1 mg via ORAL
  Filled 2018-03-05 (×2): qty 1

## 2018-03-05 MED ORDER — LORAZEPAM 1 MG PO TABS: 2.0000 mg | ORAL_TABLET | Freq: Once | ORAL | Status: AC

## 2018-03-05 MED ORDER — LORAZEPAM 1 MG PO TABS: 1.0000 mg | ORAL_TABLET | Freq: Four times a day (QID) | ORAL | Status: DC

## 2018-03-05 MED ORDER — HALOPERIDOL 5 MG PO TABS
5.0000 mg | ORAL_TABLET | Freq: Two times a day (BID) | ORAL | Status: DC
  Administered 2018-03-05 – 2018-03-06 (×2): 5 mg via ORAL
  Filled 2018-03-05 (×8): qty 1

## 2018-03-05 MED ORDER — LORAZEPAM 2 MG/ML IJ SOLN: 1.0000 mg | Freq: Four times a day (QID) | INTRAMUSCULAR | Status: DC

## 2018-03-05 NOTE — ED Notes (Signed)
Pt is refusing blood at this time, IVC is in progress

## 2018-03-05 NOTE — Progress Notes (Signed)
Pt received Ativan 2 mg IM. Pt calmer. Snacks and fluids offered; both accepted. Pt state "I took a few oxys; they were prescribed to me...might have been old. I took something for my skin and something else my friend gave me". Pt state "I went to ED after feeling this tingling sensation in my left hand". Pt endorsees "wanting to get back on my meds". Pt states she lives with her boyfriend. Pt states "He treats me like a queen". EKG done and place in front of chart. Provider on call Donell SievertSimon Spencer made aware. Writer assisted Pt in making a phone call. Pt proceeded to lay down in bed. No new c/o's. Support and encouragement provided. Will continue with POC.

## 2018-03-05 NOTE — Progress Notes (Signed)
CSW aware patient is being recommended for further inpatient psychiatric treatment and is aware patient has been [facility]. CSW aware patient's boyfriend is also here and is requesting information on patient. CSW received verbal permission to speak to patient about her disposition. Per patient, patient's boyfriend is able to stay at her place while she is receiving more treatment. Per patient's boyfriend, patient has the key to her place. CSW to coordinate with RNs to get key from one patient to the other.  Archie BalboaMackenzie Irwin, LCSWA  Clinical Social Work Department  [facility]  [phone]

## 2018-03-05 NOTE — BH Assessment (Addendum)
Assessment Note  Betty Parrish is an 27 y.o. female [facility]. Patient is brought in by her boyfriend due to "wanting to get back on my meds." Patient was a poor historian due to AMS and reluctance to answer assessors questions. Patient appears to be responding to internal stimuli during assessment process. She is staring at something in the corner of the room and she appears to be experiencing some thought blocking. Patient denies visual hallucinations but endorses auditory hallucinations of "people screaming at me to do something." Patient reports she was previously on an Abilify injection that she was receiving until the summer of 2019 at [facility]. She states she uses this as a mood stabilizer, however denies any manic or depressive symptoms at this time. Per chart review, she expressed irritability to triage nurse.  Patient states she was discharged but is unsure why. Patient denies SI/ HI. Patient states  "I just want to see my boyfriend" who is also a patient. Patient denies any prior hospitalizations, however, per chart review she was hospitalized [facility] for schizoaffective disorder. Patient reports she lives in an apartment with her boyfriend. This clinician attempted to assess for trauma, however patient remained quiet when those questions were asked. Patient denies any current legal charges.   Patient was alert and sitting up in bed watching TV. She was dressed in scrubs. She appeared psychotic as she was staring intently at something in the corner of the room. Her speech was delayed and she appeared to be experiencing thought blocking. Patient's mood was apathetic and her affect was flat. Her insight, judgement, and impulse control are impaired. EDP is in process of obtaining IVC.   Diagnosis: F25.0 Schizoaffective disorder, bipolar type  Past Medical History:  Past Medical History:  Diagnosis Date  . ADHD (attention deficit hyperactivity disorder)   .  Anxiety   . Heart murmur   . Herpes   . Schizo-affective psychosis (HCC)   . Sickle cell trait (HCC)     History reviewed. No pertinent surgical history.  Family History:  Family History  Problem Relation Age of Onset  . Sickle cell anemia Mother     Social History:  reports that she has been smoking cigarettes. She has been smoking about 1.00 pack per day. She has never used smokeless tobacco. She reports current drug use. Drugs: Marijuana and Cocaine. She reports that she does not drink alcohol.  Additional Social History:  Alcohol / Drug Use Pain Medications: see MAR Prescriptions: see MAR Over the Counter: see MAR Longest period of sobriety (when/how long): patient denies  CIWA: CIWA-Ar BP: 134/75 Pulse Rate: 84 COWS:    Allergies:  Allergies  Allergen Reactions  . Red Dye Hives    Hawaiian punch  . Zyprexa [Olanzapine] Other (See Comments)    Muscle pain and jerking     Home Medications: (Not in a hospital admission)   OB/GYN Status:  No LMP recorded. (Menstrual status: IUD).  General Assessment Data Location of Assessment: [facility] ED TTS Assessment: In system Is this a Tele or Face-to-Face Assessment?: Face-to-Face Is this an Initial Assessment or a Re-assessment for this encounter?: Initial Assessment Patient Accompanied by:: Other(boyfriend) Language Other than English: No Living Arrangements: Other (Comment)(with boyfriend) What gender do you identify as?: Female Marital status: Long term relationship Maiden name: Margo AyeHall Pregnancy Status: Unable to assess Living Arrangements: Spouse/significant other Can pt return to current living arrangement?: Yes Admission Status: Involuntary Petitioner: ED Attending Is patient capable of  signing voluntary admission?: No Referral Source: Self/Family/Friend Insurance type: Medicaid     Crisis Care Plan Living Arrangements: Spouse/significant other     Risk to self with the past 6 months Suicidal Ideation:  No Has patient been a risk to self within the past 6 months prior to admission? : No Suicidal Intent: No Has patient had any suicidal intent within the past 6 months prior to admission? : No Is patient at risk for suicide?: No Suicidal Plan?: No Has patient had any suicidal plan within the past 6 months prior to admission? : No Access to Means: No What has been your use of drugs/alcohol within the last 12 months?: patient denies Previous Attempts/Gestures: No How many times?: 0 Other Self Harm Risks: none reported Triggers for Past Attempts: None known Intentional Self Injurious Behavior: None Family Suicide History: No Recent stressful life event(s): Recent negative physical changes(medication noncompliance) Persecutory voices/beliefs?: Yes Depression: No Depression Symptoms: (patient denies these symptoms) Substance abuse history and/or treatment for substance abuse?: No Suicide prevention information given to non-admitted patients: Not applicable  Risk to Others within the past 6 months Homicidal Ideation: No Does patient have any lifetime risk of violence toward others beyond the six months prior to admission? : No Thoughts of Harm to Others: No Current Homicidal Intent: No Current Homicidal Plan: No Access to Homicidal Means: No Identified Victim: none History of harm to others?: No Assessment of Violence: None Noted Violent Behavior Description: none Does patient have access to weapons?: No Criminal Charges Pending?: No Does patient have a court date: No Is patient on probation?: No  Psychosis Hallucinations: Auditory, Visual, With command Delusions: None noted  Mental Status Report Appearance/Hygiene: In scrubs Eye Contact: Poor Motor Activity: Freedom of movement Speech: Slow(delayed) Level of Consciousness: Quiet/awake Mood: Apathetic Affect: Flat Anxiety Level: Minimal Thought Processes: Circumstantial, Thought Blocking Judgement: Impaired Orientation:  Person, Place, Time, Situation Obsessive Compulsive Thoughts/Behaviors: None  Cognitive Functioning Concentration: Poor Memory: Recent Intact, Remote Intact Is patient IDD: No Insight: Poor Impulse Control: Poor Appetite: Good Have you had any weight changes? : No Change Sleep: No Change Total Hours of Sleep: 8 Vegetative Symptoms: None  ADLScreening [facility]) Patient's cognitive ability adequate to safely complete daily activities?: Yes Patient able to express need for assistance with ADLs?: Yes Independently performs ADLs?: Yes (appropriate for developmental age)  Prior Inpatient Therapy Prior Inpatient Therapy: Yes Prior Therapy Dates: 2016, 2014 [facility] Reason for Treatment: schizoaffective disorder  Prior Outpatient Therapy Prior Outpatient Therapy: Yes Prior Therapy Dates: 2019 Prior Therapy Facilty/Provider(s): [facility] Reason for Treatment: psychosis Does patient have an ACCT team?: No Does patient have Intensive In-House Services?  : No Does patient have [facility] services? : No Does patient have P4CC services?: No  ADL Screening (condition at time of admission) Patient's cognitive ability adequate to safely complete daily activities?: Yes Is the patient deaf or have difficulty hearing?: No Does the patient have difficulty seeing, even when wearing glasses/contacts?: No Does the patient have difficulty concentrating, remembering, or making decisions?: No Patient able to express need for assistance with ADLs?: Yes Does the patient have difficulty dressing or bathing?: No Independently performs ADLs?: Yes (appropriate for developmental age) Does the patient have difficulty walking or climbing stairs?: No Weakness of Legs: None Weakness of Arms/Hands: None  Home Assistive Devices/Equipment Home Assistive Devices/Equipment: None  Therapy Consults (therapy consults require a physician order) PT  Evaluation Needed: No OT Evalulation Needed: No SLP Evaluation Needed: No Abuse/Neglect  Assessment (Assessment to be complete while patient is alone) Abuse/Neglect Assessment Can Be Completed: Unable to assess, patient is non-responsive or altered mental status Values / Beliefs Cultural Requests During Hospitalization: None Spiritual Requests During Hospitalization: None Consults Spiritual Care Consult Needed: No Social Work Consult Needed: No Merchant navy officer (For Healthcare) Does Patient Have a Medical Advance Directive?: No Would patient like information on creating a medical advance directive?: Yes (ED - Information included in AVS)          Disposition: Per Dr. Sharma Covert and Nanine Means, NP patient meets in patient criteria. TTS to secure placement. Disposition Initial Assessment Completed for this Encounter: Yes  On Site Evaluation by:   Reviewed with Physician:    Celedonio Miyamoto 03/05/2018 7:54 AM

## 2018-03-05 NOTE — ED Notes (Signed)
Patient oriented to unit. Patient is paranoid and appears to be thought blocking.  TTS currently at beside.

## 2018-03-05 NOTE — Progress Notes (Addendum)
Patient ID: Betty BrinkKapri Loiselle, female   DOB: 04-06-1990, 27 y.o.   MRN: [id number]  Admission Note  D) Patient admitted to the adult unit [address]. Patient is a 27 year old female who is under IVC from [facility]. Patient presents with mild paranoia and is watchful during the admission but was pleasant and cooperative. Patient reports she came to the hospital because "I haven't been able to refill my meds. I have been off of them since June". Patient reports she would like to restart medications and "get set back up with my ACTT team". Patient reports she lives in an apartment with her 12eight year old daughter and works as a Airline pilotwaiter. Patient denies alcohol or drug use except for marijuana. Patient reports her mom is her legal guardian. Patient currently denies SI/HI/AVH. Patient signed all consent forms and wrote down phone numbers from her cell phone.  Skin assessment was completed and unremarkable except for small bruises to her forearms.   Per chart review, patient with history of schizoaffective disorder [facility]. Patient brought to ED by boyfriend for bizarre behavior and hallucinations.   A) Plan of care, unit policies and patient expectations were explained. Written consents obtained. Patient oriented to the unit and their room. Patient placed on standard q15 safety checks. Low fall risk precautions initiated and reviewed with patient.Patient belongings searched with no contraband found. Belongings secured in locker. Vital signs obtained. Snacks and fluids offered.    R) Patient is in no acute distress and verbalizes understanding of information provided. Patient with no concerns at this time. Patient contracts for safety with staff on the unit. Report given to receiving RN.

## 2018-03-05 NOTE — ED Triage Notes (Signed)
Pt is suppose to take Vistaril 25 mg abilify 10 mg

## 2018-03-05 NOTE — ED Triage Notes (Signed)
Pt states that she hasn't had her meds since the summer time and she wants to go back om them, she states that sh'e really angry right now

## 2018-03-05 NOTE — Progress Notes (Addendum)
Patient ID: Glennis BrinkKapri Parrish, female   DOB: 10-Feb-1991, 27 y.o.   MRN: [id number]  Patient has flooded her bathroom floor and was attempting to elope off the unit. Patient was heard banging on the doors to the unit. MD on call notified of patient behavior. See new orders. Patient medicated as ordered. Patient states to writer, "no one knows I am here. I see angels". Patient reports she believes the [facility] on the [place] is an Chief Technology Officerangel. Patient is tearful and asks, "am I going to die?" Patient is observed responding to internal stimuli. Patient resting now in no acute distress. Will continue to monitor.

## 2018-03-05 NOTE — ED Notes (Signed)
[name] on unit to transfer pt to [facility] Adult unit per MD order. Personal property given to [name] for transfer. Ambulatory off unit in police custody.  

## 2018-03-05 NOTE — ED Notes (Signed)
[name] called for transport 

## 2018-03-05 NOTE — ED Provider Notes (Signed)
[facility]-EMERGENCY DEPT Provider Note   CSN: [id number] Arrival date & time: 03/04/18  2340     History   Chief Complaint Chief Complaint  Patient presents with  . Medication Refill    HPI Betty Parrish is a 27 y.o. female.  Level 5 caveat for psychiatric illness.  Patient appears to be responding to internal stimuli and will not answer questions directly.  She has a flat affect and is angry.  She is taking her clothes off in the room and rubbing herself with cleaning wipes stating that she "needs these to live".  Per her boyfriend at bedside she has been off of her psychiatric medications for the past 6 months.  She will not answer whether she is having suicidal homicidal thoughts.  She stares off and answers angrily when asked who she has but answers appropriately and appears to be oriented.  She seems to be responding to internal stimuli.  The history is provided by the patient and a relative.  Medication Refill    Past Medical History:  Diagnosis Date  . ADHD (attention deficit hyperactivity disorder)   . Anxiety   . Heart murmur   . Herpes   . Schizo-affective psychosis (HCC)   . Sickle cell trait [facility](HCC)     Patient Active Problem List   Diagnosis Date Noted  . Cannabis abuse   . Bipolar disorder, manic phase (HCC) 09/30/2014  . Schizoaffective disorder, bipolar type (HCC) 09/29/2014  . Mania (HCC) 12/18/2012    History reviewed. No pertinent surgical history.   OB History   No obstetric history on file.      Home Medications    Prior to Admission medications   Medication Sig Start Date End Date Taking? Authorizing Provider  ARIPiprazole (ABILIFY) 10 MG tablet Take 10 mg by mouth daily.   Yes [provider]  hydrOXYzine (ATARAX/VISTARIL) 25 MG tablet Take 1 tablet (25 mg total) by mouth 2 (two) times daily. 08/30/17  Yes Laveda AbbeParks, Laurie Britton, NP  ibuprofen (ADVIL,MOTRIN) 800 MG tablet Take 1 tablet (800 mg total) by mouth 3  (three) times daily. Patient taking differently: Take 400-600 mg by mouth every 6 (six) hours as needed for headache or mild pain.  03/13/17  Yes Arby BarrettePfeiffer, Marcy, MD  dicyclomine (BENTYL) 20 MG tablet Take 1 tablet (20 mg total) by mouth 2 (two) times daily. Patient not taking: Reported on 03/05/2018 01/12/18   Arthor CaptainHarris, Abigail, PA-C  haloperidol (HALDOL) 5 MG tablet Take 1 tablet (5 mg total) by mouth 2 (two) times daily. Patient not taking: Reported on 03/05/2018 08/30/17   Laveda AbbeParks, Laurie Britton, NP  lidocaine (LIDODERM) 5 % Place 1 patch onto the skin daily. Remove & Discard patch within 12 hours or as directed by MD Patient not taking: Reported on 01/12/2018 09/04/17   Palumbo, April, MD  polyethylene glycol powder (GLYCOLAX/MIRALAX) powder Take 17 g by mouth 2 (two) times daily. Mix 1 capful in 10 oz of fluid and drink 2 times daily Patient not taking: Reported on 03/05/2018 01/12/18   Arthor CaptainHarris, Abigail, PA-C    Family History Family History  Problem Relation Age of Onset  . Sickle cell anemia Mother     Social History Social History   Tobacco Use  . Smoking status: Current Every Day Smoker    Packs/day: 1.00    Types: Cigarettes  . Smokeless tobacco: Never Used  Substance Use Topics  . Alcohol use: No    Frequency: Never  . Drug use:  Yes    Types: Marijuana, Cocaine    Comment: last used marijuana yesterday, denies cocaine use     Allergies   Red dye and Zyprexa [olanzapine]   Review of Systems Review of Systems  Unable to perform ROS: Psychiatric disorder     Physical Exam Updated Vital Signs BP 134/75 (BP Location: Right Arm)   Pulse 84   Temp 97.9 F (36.6 C) (Oral)   Resp 16   SpO2 100%   Physical Exam Vitals signs and nursing note reviewed.  Constitutional:      General: She is not in acute distress.    Appearance: She is well-developed.     Comments: Anxious, taking her clothes off and taking alcohol wipes out of the bin stating that she "needs these to  live".  HENT:     Head: Normocephalic and atraumatic.     Mouth/Throat:     Pharynx: No oropharyngeal exudate.  Eyes:     Conjunctiva/sclera: Conjunctivae normal.     Pupils: Pupils are equal, round, and reactive to light.  Neck:     Musculoskeletal: Normal range of motion and neck supple.     Comments: No meningismus. Cardiovascular:     Rate and Rhythm: Normal rate and regular rhythm.     Heart sounds: Normal heart sounds. No murmur.  Pulmonary:     Effort: Pulmonary effort is normal. No respiratory distress.     Breath sounds: Normal breath sounds.  Abdominal:     Palpations: Abdomen is soft.     Tenderness: There is no abdominal tenderness. There is no guarding or rebound.  Musculoskeletal: Normal range of motion.        General: No tenderness.  Skin:    General: Skin is warm.  Neurological:     Mental Status: She is alert.     Cranial Nerves: No cranial nerve deficit.     Motor: No abnormal muscle tone.     Coordination: Coordination normal.     Comments: Flat affect, answers questions angrily when she wants to.  She is oriented to person and place.  Moves all extremities without deficit.  Psychiatric:        Behavior: Behavior normal.      ED Treatments / Results  Labs (all labs ordered are listed, but only abnormal results are displayed) Labs Reviewed  COMPREHENSIVE METABOLIC PANEL - Abnormal; Notable for the following components:      Result Value   Potassium 3.4 (*)    Glucose, Bld 100 (*)    Total Bilirubin 1.4 (*)    All other components within normal limits  ACETAMINOPHEN LEVEL - Abnormal; Notable for the following components:   Acetaminophen (Tylenol), Serum <10 (*)    All other components within normal limits  CBC WITH DIFFERENTIAL/PLATELET  ETHANOL  SALICYLATE LEVEL  RAPID URINE DRUG SCREEN, HOSP PERFORMED  PREGNANCY, URINE    EKG None  Radiology No results found.  Procedures Procedures (including critical care time)  Medications  Ordered in ED Medications - No data to display   Initial Impression / Assessment and Plan / ED Course  I have reviewed the triage vital signs and the nursing notes.  Pertinent labs & imaging results that were available during my care of the patient were reviewed by me and considered in my medical decision making (see chart for details).    Patient appears to be responding to internal stimuli and appears to be psychotic.  IVC paperwork is completed. No obvious signs of  trauma.  No fever.  No meningismus.  Patient with concern for responding to internal stimuli with thought blocking.  IVC paperwork completed to facilitate her cooperation and work-up.  TTS consult performed.  She is medically clear pending results of her lab studies. Final Clinical Impressions(s) / ED Diagnoses   Final diagnoses:  None    ED Discharge Orders    None       Loralyn Rachel, Jeannett Senior, MD 03/05/18 [phone]

## 2018-03-05 NOTE — ED Notes (Signed)
Pt called from triage with no answer 

## 2018-03-05 NOTE — Tx Team (Signed)
Initial Treatment Plan 03/05/2018 8:03 PM Betty Parrish [id number]    PATIENT STRESSORS: Medication change or noncompliance   PATIENT STRENGTHS: Physical Health Supportive family/friends   PATIENT IDENTIFIED PROBLEMS: "get back on medications"  "get back with my ACTT team"  Psychosis                 DISCHARGE CRITERIA:  Ability to meet basic life and health needs Adequate post-discharge living arrangements Improved stabilization in mood, thinking, and/or behavior Medical problems require only outpatient monitoring Motivation to continue treatment in a less acute level of care Need for constant or close observation no longer present Reduction of life-threatening or endangering symptoms to within safe limits Safe-care adequate arrangements made Verbal commitment to aftercare and medication compliance  PRELIMINARY DISCHARGE PLAN: Outpatient therapy  PATIENT/FAMILY INVOLVEMENT: This treatment plan has been presented to and reviewed with the patient, Betty BrinkKapri Galloway.  The patient and family have been given the opportunity to ask questions and make suggestions.  Ferrel LoganAmanda A Anselma Herbel, RN 03/05/2018, 8:03 PM

## 2018-03-05 NOTE — BH Assessment (Signed)
[facility]  Per Juanetta BeetsJacqueline Norman, DO, this pt requires psychiatric hospitalization.  Berneice Heinrichina Tate, RN, [id number]; [facility] 16[facility]00.  Pt presents under IVC initiated by EDP Glynn OctaveStephen Rancour, MD and [facility].  Pt's nurse, Donavan FoilKinesha, has been notified, and agrees to call report to [phone].  Pt is to be transported via [employer] examinerlaw enforcement.   Doylene Canninghomas Latrell Potempa, [place] Behavioral Health Coordinator [phone]

## 2018-03-06 DIAGNOSIS — F250 Schizoaffective disorder, bipolar type: Principal | ICD-10-CM

## 2018-03-06 MED ORDER — ARIPIPRAZOLE 5 MG PO TABS
5.0000 mg | ORAL_TABLET | Freq: Every day | ORAL | Status: DC
  Administered 2018-03-06 – 2018-03-08 (×3): 5 mg via ORAL
  Filled 2018-03-06 (×5): qty 1

## 2018-03-06 MED ORDER — HALOPERIDOL 5 MG PO TABS
5.0000 mg | ORAL_TABLET | Freq: Four times a day (QID) | ORAL | Status: DC | PRN
  Administered 2018-03-07 – 2018-03-08 (×2): 5 mg via ORAL
  Filled 2018-03-06 (×2): qty 1

## 2018-03-06 MED ORDER — BENZTROPINE MESYLATE 1 MG PO TABS
1.0000 mg | ORAL_TABLET | Freq: Four times a day (QID) | ORAL | Status: DC | PRN
  Administered 2018-03-07 – 2018-03-08 (×2): 1 mg via ORAL
  Filled 2018-03-06 (×2): qty 1
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
## 2018-03-06 NOTE — Plan of Care (Signed)
Progress note  D: pt found in bed; compliant with medication administration. Pt states she slept well though still groggy. Pt rates her depression/hopelessness/anxiety a 5/5/8 out of 10 respectively. Pt has complaints of diarrhea, cravings, chills, nausea, irritability, lightheadedness, dizziness, headaches, rash, and blurred vision on her self inventory, all of which she denied to this Clinical research associatewriter. Pt states her goal for today is to get medicine to stable her mood long enough for court January 2nd to get her children. Pt will achieve this by taking her meds. Pt denies any si/hi/ah/vh and verbally agrees to approach staff if these become apparent or before harm[facility]. Pt has been more interactive in the dayroom today and doesn't seem to be as paranoid as she was yesterday upon admission. A: pt provided support and encouragement. Pt given medication per protocol and standing orders. [id number] safety checks implemented and continued.  R: pt safe on the unit; will continue to monitor.  Pt progressing in the following metrics  Problem: Education: Goal: Ability to verbalize precipitating factors for violent behavior will improve Outcome: Progressing   Problem: Coping: Goal: Ability to verbalize frustrations and anger appropriately will improve Outcome: Progressing   Problem: Health Behavior/Discharge Planning: Goal: Ability to implement measures to prevent violent behavior in the future will improve Outcome: Progressing   Problem: Safety: Goal: Ability to demonstrate self-control will improve Outcome: Progressing

## 2018-03-06 NOTE — BHH Group Notes (Signed)
[facility] Group Notes: (Clinical Social Work)   03/06/2018      Type of Therapy:  Group Therapy   Participation Level:  Did Not Attend despite MHT prompting   Bonita Brindisi Grossman-Orr, LCSW 03/06/2018, 12:23 PM     

## 2018-03-06 NOTE — BHH Counselor (Signed)
Clinical Social Work Note  Psychosocial Assessment was started with pt, but she kept falling asleep and had to be awakened.  She finally went into a deep sleep and could not be roused.  This will continued tomorrow by CSW.  Ambrose MantleMareida Grossman-Orr, LCSW 03/06/2018, 3:03 PM

## 2018-03-06 NOTE — Progress Notes (Signed)
   03/06/18 0500  Sleep  Number of Hours 6.75

## 2018-03-06 NOTE — Progress Notes (Signed)
Pt observe resting in bed with eyes closed. Pt was awaken for assessment. Pt appears flat/anxious in affect and mood. Pt denies SI/HI/AVH at this time. Pt states she is going to be compliant with her meds and treatment, because she wants to go home. Pt made a phone call to mother this evening.Pt received snacks and fluids. PRN ativan offered and accepted. Support and encouragement offered. Will continue with POC.
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
## 2018-03-06 NOTE — [facility] Suicide Risk Assessment (Signed)
[facility]   Nursing information obtained from:  Patient, Review of record Demographic factors:  Adolescent or young adult Current Mental Status:  NA Loss Factors:  NA Historical Factors:  NA Risk Reduction Factors:  Positive social support, Responsible for children under 318 years of age  Total Time spent with patient: 45 minutes Principal Problem: Schizoaffective Disorder Diagnosis:  Active Problems:   Schizoaffective disorder, bipolar type (HCC)  Subjective Data:   Continued Clinical Symptoms:  Alcohol Use Disorder Identification Test Final Score (AUDIT): 0 The "Alcohol Use Disorders Identification Test", Guidelines for Use in Primary Care, Second Edition.  World Science writerHealth Organization [facility](WHO). Score between 0-7:  no or low risk or alcohol related problems. Score between 8-15:  moderate risk of alcohol related problems. Score between 16-19:  high risk of alcohol related problems. Score 20 or above:  warrants further diagnostic evaluation for alcohol dependence and treatment.   CLINICAL FACTORS:  27 year old female, presented to ED voluntarily reporting not doing well, feeling overwhelmed, presenting with psychotic symptoms ( people screaming) and requesting to be restarted on psychiatric medications. States she has been diagnosed with Schizoaffective Disorder in the past and that she had done very well on Abilify, which she has been off of for several months. As per chart was initially agitated /disorganized/ irritable on admission to unit  but currently presents calm, cooperative.   Psychiatric Specialty Exam: Physical Exam  ROS  Blood pressure 120/83, pulse 77, temperature 97.9 F (36.6 C), temperature source Oral, resp. rate 18, height 5\' 7"  (1.702 m), weight 77.1 kg, SpO2 100 %.Body mass index is 26.63 kg/m.  See admit note MSE   COGNITIVE FEATURES THAT CONTRIBUTE TO RISK:  Closed-mindedness and Loss of executive function    SUICIDE RISK:   Moderate:  Frequent suicidal ideation with limited intensity, and duration, some specificity in terms of plans, no associated intent, good self-control, limited dysphoria/symptomatology, some risk factors present, and identifiable protective factors, including available and accessible social support.  PLAN OF CARE: Patient will be admitted to inpatient psychiatric unit for stabilization and safety. Will provide and encourage milieu participation. Provide medication management and maked adjustments as needed.  Will follow daily.    I certify that inpatient services furnished can reasonably be expected to improve the patient's condition.   Craige CottaFernando A Powell Halbert, MD 03/06/2018, 12:01 PM

## 2018-03-07 MED ORDER — ONDANSETRON HCL 4 MG PO TABS
4.0000 mg | ORAL_TABLET | Freq: Three times a day (TID) | ORAL | Status: DC | PRN
  Administered 2018-03-07: 4 mg via ORAL
  Filled 2018-03-07: qty 1

## 2018-03-07 MED ORDER — NICOTINE 21 MG/24HR TD PT24
21.0000 mg | MEDICATED_PATCH | Freq: Every day | TRANSDERMAL | Status: DC
  Administered 2018-03-08: 21 mg via TRANSDERMAL
  Filled 2018-03-07 (×2): qty 1

## 2018-03-07 MED ORDER — VALACYCLOVIR HCL 500 MG PO TABS
1000.0000 mg | ORAL_TABLET | Freq: Every day | ORAL | Status: DC
  Administered 2018-03-07 – 2018-03-08 (×2): 1000 mg via ORAL
  Filled 2018-03-07 (×4): qty 2

## 2018-03-07 NOTE — Progress Notes (Addendum)
[facility] MD Progress Note  03/07/2018 2:03 PM Betty Parrish  MRN:  [id number] Subjective:  Im really sleepy.  This most likely due to the meds that I am taking.  But I have been able to sleep much better.  I do home that I get to go home soon, as I have a 27-year-old daughter who turns 8 on January 1.  Mentally I want to go home.  Objective: 27 year old female presents to ED with complaints of worsening depression, psychosis, and personal history of schizoaffective disorder.  Patient assessed during today's evaluation, presents with much clear orientation.  Patient is alert and oriented, calm and cooperative.  She does appear disheveled, however improved thought processes.  She continues to remain hyper focused on discharge.  She is able to list multiple reasons for discharge to include upcoming daughter's birthday, court case pending.  She states the next time she would try and get $27 for medication versus coming to the hospital.  At this time she denies any agitation and or psychosis.  She is able to contract for safety while on the unit.  She denies any suicidal ideations, homicidal ideations, and or psychosis. Principal Problem: Schizoaffective disorder, bipolar type (HCC) Diagnosis: Active Problems:   Schizoaffective disorder, bipolar type (HCC)  Total Time spent with patient: 20 minutes  Past Psychiatric History: reports history of prior psychiatric admissions, last time about 4-5 ago . Reports she has been diagnosed with Schizoaffective Disorder and Bipolar Disorder in the past, and with ODD and ADHD as a child . Reports history of suicide attempt by hanging self in 2016. History of self cutting but not recently.  Reports history of PTSD symptoms as above . States she had done well on Abilify ( depot IM injection) in the past, and that this medication has been very effective for her as monotherapy , but that she had been off injection for more than a year, and off oral Abilify for months  .  Substance Abuse History in the last 12 months:  Denies alcohol abuse, describes regular cannabis use, uses daily, denies other drug abuse  Consequences of Substance Abuse: Denies  Previous Psychotropic Medications:  Abilify x several years, to include Abilify Maintena IM in the past ( last injection was more than a year ago)  . Vistaril as needed . States Abilify has helped and been well tolerated. Reports she had run out of medication several months ago.  Psychological Evaluations:  No  Past Medical History: denies medical illnesses, she states she is allergic to Zyprexa, but describes side effect rather than allergic reaction, reports  it as " causing twitches" Prior Outpatient Therapy:  [facility], states she had been followed by ACT team in the past, but not recently  Past Medical History:  Past Medical History:  Diagnosis Date  . ADHD (attention deficit hyperactivity disorder)   . Anxiety   . Heart murmur   . Herpes   . Schizo-affective psychosis (HCC)   . Sickle cell trait (HCC)    History reviewed. No pertinent surgical history. Family History:  Family History  Problem Relation Age of Onset  . Sickle cell anemia Mother    Family Psychiatric  History: reports father has history of PTSD from being in combat , states father has attempted suicide in the past, reports father and a sister have history of alcohol use disorder  Social History:  Social History   Substance and Sexual Activity  Alcohol Use No  . Frequency: Never     Social  History   Substance and Sexual Activity  Drug Use Yes  . Types: Marijuana, Cocaine   Comment: last used marijuana yesterday, denies cocaine use    Social History   Socioeconomic History  . Marital status: Significant Other    Spouse name: Not on file  . Number of children: Not on file  . Years of education: Not on file  . Highest education level: Not on file  Occupational History  . Not on file  Social Needs  . Financial resource  strain: Not on file  . Food insecurity:    Worry: Not on file    Inability: Not on file  . Transportation needs:    Medical: Not on file    Non-medical: Not on file  Tobacco Use  . Smoking status: Current Every Day Smoker    Packs/day: 1.00    Types: Cigarettes  . Smokeless tobacco: Never Used  Substance and Sexual Activity  . Alcohol use: No    Frequency: Never  . Drug use: Yes    Types: Marijuana, Cocaine    Comment: last used marijuana yesterday, denies cocaine use  . Sexual activity: Yes    Birth control/protection: I.U.D.  Lifestyle  . Physical activity:    Days per week: Not on file    Minutes per session: Not on file  . Stress: Not on file  Relationships  . Social connections:    Talks on phone: Not on file    Gets together: Not on file    Attends religious service: Not on file    Active member of club or organization: Not on file    Attends meetings of clubs or organizations: Not on file    Relationship status: Not on file  Other Topics Concern  . Not on file  Social History Narrative  . Not on file   Additional Social History:    Sleep: Fair  Appetite:  Poor  Current Medications: Current Facility-Administered Medications  Medication Dose Route Frequency Provider Last Rate Last Dose  . acetaminophen (TYLENOL) tablet 650 mg  650 mg Oral Q6H PRN Charm RingsLord, Jamison Y, NP      . ARIPiprazole (ABILIFY) tablet 5 mg  5 mg Oral Daily Cobos, Fernando A, MD   5 mg at 03/07/18 0901  . haloperidol (HALDOL) tablet 5 mg  5 mg Oral Q6H PRN Cobos, Rockey SituFernando A, MD   5 mg at 03/07/18 0047   And  . benztropine (COGENTIN) tablet 1 mg  1 mg Oral Q6H PRN Cobos, Rockey SituFernando A, MD   1 mg at 03/07/18 0047  . LORazepam (ATIVAN) tablet 1 mg  1 mg Oral Q6H PRN Cobos, Rockey SituFernando A, MD   1 mg at 03/06/18 2031   Or  . LORazepam (ATIVAN) injection 1 mg  1 mg Intramuscular Q6H PRN Cobos, Fernando A, MD      . magnesium hydroxide (MILK OF MAGNESIA) suspension 30 mL  30 mL Oral Daily PRN Charm RingsLord,  Jamison Y, NP        Lab Results:  Results for orders placed or performed during the hospital encounter of 03/05/18 (from the past 48 hour(s))  Rapid urine drug screen (hospital performed)     Status: Abnormal   Collection Time: 03/05/18  3:30 PM  Result Value Ref Range   Opiates NONE DETECTED NONE DETECTED   Cocaine NONE DETECTED NONE DETECTED   Benzodiazepines NONE DETECTED NONE DETECTED   Amphetamines NONE DETECTED NONE DETECTED   Tetrahydrocannabinol POSITIVE (A) NONE DETECTED  Barbiturates NONE DETECTED NONE DETECTED    Comment: (NOTE) DRUG SCREEN FOR MEDICAL PURPOSES ONLY.  IF CONFIRMATION IS NEEDED FOR ANY PURPOSE, NOTIFY LAB WITHIN 5 DAYS. LOWEST DETECTABLE LIMITS FOR URINE DRUG SCREEN Drug Class                     Cutoff (ng/mL) Amphetamine and metabolites    1000 Barbiturate and metabolites    200 Benzodiazepine                 200 Tricyclics and metabolites     300 Opiates and metabolites        300 Cocaine and metabolites        300 THC                            50 Performed at [facility], [address] [place], [place] [address]   Pregnancy, urine     Status: None   Collection Time: 03/05/18  3:30 PM  Result Value Ref Range   Preg Test, Ur NEGATIVE NEGATIVE    Comment:        THE SENSITIVITY OF THIS METHODOLOGY IS >20 mIU/mL. Performed at [facility], [address] [place], [place] [address]     Blood Alcohol level:  Lab Results  Component Value Date   ETH <10 03/05/2018   ETH <10 08/30/2017    Metabolic Disorder Labs: No results found for: HGBA1C, MPG No results found for: PROLACTIN No results found for: CHOL, TRIG, HDL, CHOLHDL, VLDL, LDLCALC  Physical Findings: AIMS: Facial and Oral Movements Muscles of Facial Expression: None, normal Lips and Perioral Area: None, normal Jaw: None, normal Tongue: None, normal,Extremity Movements Upper (arms, wrists, hands, fingers): None, normal Lower (legs,  knees, ankles, toes): None, normal, Trunk Movements Neck, shoulders, hips: None, normal, Overall Severity Severity of abnormal movements (highest score from questions above): None, normal Incapacitation due to abnormal movements: None, normal Patient's awareness of abnormal movements (rate only patient's report): No Awareness, Dental Status Current problems with teeth and/or dentures?: No Does patient usually wear dentures?: No  CIWA:    COWS:     Musculoskeletal: Strength & Muscle Tone: within normal limits Gait & Station: normal Patient leans: N/A  Psychiatric Specialty Exam: Physical Exam  ROS  Blood pressure 97/76, pulse 86, temperature 97.9 F (36.6 C), temperature source Oral, resp. rate 18, height 5\' 7"  (1.702 m), weight 77.1 kg, SpO2 100 %.Body mass index is 26.63 kg/m.  General Appearance: Disheveled  Eye Contact:  Minimal  Speech:  Clear and Coherent and Normal Rate  Volume:  Normal  Mood:  Depressed  Affect:  Depressed and Flat  Thought Process:  Linear and Descriptions of Associations: Intact  Orientation:  Full (Time, Place, and Person)  Thought Content:  Rumination and Tangential  Suicidal Thoughts:  No  Homicidal Thoughts:  No  Memory:  Immediate;   Poor Recent;   Fair  Judgement:  Fair  Insight:  Lacking  Psychomotor Activity:  Decreased and Psychomotor Retardation  Concentration:  Concentration: Poor and Attention Span: Fair  Recall:  [employer] of Knowledge:  Poor  Language:  Poor  Akathisia:  No  Handed:  Right  AIMS (if indicated):     Assets:  Desire for Improvement Financial Resources/Insurance Physical Health Social Support Transportation Vocational/Educational  ADL's:  Intact  Cognition:  WNL  Sleep:  Number of Hours: 4.5  Treatment Plan Summary: Daily contact with patient to assess and evaluate symptoms and progress in treatment and Medication management  Treatment Plan/Recommendations:  1 Admit for crisis management and  stabilization. Estimated length of stay 5-7 days past his current stay of 1.  2 Individual and group therapy. 3 Medication management for depression, and anxiety to reduce current symptoms to base line and improve the overall levels of functioning: Medications reviewed with the patient and she stated no untoward effects, home medications in place.   Continue Abilify Maintenna ( IM ) management . Continue Abilify 5 mgrs QDAY Haldol/Ativan PRN for Agitation if needed  4 Coping skills for depression and anxiety developing.  5 Continue crisis stabilization and management.  6 Address health issues- monitor vital signs, stable;  7 Treatment plan in progress to prevent relapse prevention and self care.  8 Psychosocial education regarding relapse prevention and self care 9 Heath care follow up as needed for any health concerns 10 Call for consult with hospitalist for additional specialty patient services as needed.    Maryagnes Amosakia S Starkes-Perry, FNP 03/07/2018, 2:03 PM    ..Agree with NP Progress Note

## 2018-03-07 NOTE — [facility] Group Notes (Signed)
[facility] LCSW Group Therapy Note  Date/Time:  03/07/2018  11:00AM-12:00PM  Type of Therapy and Topic:  Group Therapy:  Music and Mood  Participation Level:  None   Description of Group: In this process group, members listened to a variety of genres of music and identified that different types of music evoke different responses.  Patients were encouraged to identify music that was soothing for them and music that was energizing for them.  Patients discussed how this knowledge can help with wellness and recovery in various ways including managing depression and anxiety as well as encouraging healthy sleep habits.    Therapeutic Goals: 1. Patients will explore the impact of different varieties of music on mood 2. Patients will verbalize the thoughts they have when listening to different types of music 3. Patients will identify music that is soothing to them as well as music that is energizing to them 4. Patients will discuss how to use this knowledge to assist in maintaining wellness and recovery 5. Patients will explore the use of music as a coping skill  Summary of Patient Progress:  At the beginning of group, patient expressed that she felt "okay."  She was in and out of the room multiple times, never sitting more than a few seconds, moving from one part of the room to another over and over.   Her affect was flat and her mood was irritable.  This did not change throughout group.  Therapeutic Modalities: Solution Focused Brief Therapy Activity   Ambrose MantleMareida Grossman-Orr, LCSW

## 2018-03-07 NOTE — [facility] Suicide Risk Assessment (Signed)
[facility] INPATIENT:  Family/Significant Other Suicide Prevention Education  Suicide Prevention Education:  Contact Attempts: mother and legal guardian [name] [phone], (name of family member/significant other) has been identified by the patient as the family member/significant other with whom the patient will be residing, and identified as the person(s) who will aid the patient in the event of a mental health crisis.  With written consent from the patient, two attempts were made to provide suicide prevention education, prior to and/or following the patient's discharge.  We were unsuccessful in providing suicide prevention education.  A suicide education pamphlet was given to the patient to share with family/significant other.  Date and time of first attempt: 03/07/18 at 11:59 am - voicemail says another name no vm left. Date and time of second attempt: to be completed at a later date  Shellia CleverlyStephanie N Maison Kestenbaum 03/07/2018, 11:59 AM

## 2018-03-07 NOTE — Plan of Care (Addendum)
  Problem: Safety: Goal: Ability to demonstrate self-control will improve Outcome: Not Progressing   Problem: Activity: Goal: Interest or engagement in activities will improve Outcome: Progressing   D: Pt alert and oriented on the unit. Pt engaging with RN staff and other pts. Pt denies SI/HI, A/VH. Pt participated during unit groups and activities. Pt is cooperative but has been irritable this morning and during group. Pt talked on the telephone and cursed and yelled and was argumentative with whom she was speaking to. A: Education, support and encouragement provided, q15 minute safety checks remain in effect. Medications administered per MD orders. R: No reactions/side effects to medicine noted. Pt denies any concerns at this time, and verbally contracts for safety. Pt ambulating on the unit with no issues. Pt remains safe on and off the unit.

## 2018-03-07 NOTE — Progress Notes (Signed)
   03/07/18 0500  Sleep  Number of Hours 4.5

## 2018-03-08 MED ORDER — ARIPIPRAZOLE ER 400 MG IM SRER: 400.0000 mg | INTRAMUSCULAR | 11 refills | Status: DC

## 2018-03-08 MED ORDER — ARIPIPRAZOLE 10 MG PO TABS: 10.0000 mg | ORAL_TABLET | Freq: Every day | ORAL | 1 refills | Status: DC

## 2018-03-08 MED ORDER — VALACYCLOVIR HCL 1 G PO TABS: 1000.0000 mg | ORAL_TABLET | Freq: Every day | ORAL | 1 refills | Status: AC

## 2018-03-08 MED ORDER — ARIPIPRAZOLE ER 400 MG IM SRER
400.0000 mg | INTRAMUSCULAR | Status: DC
  Administered 2018-03-08: 400 mg via INTRAMUSCULAR

## 2018-03-08 NOTE — Progress Notes (Signed)
Recreation Therapy Notes  Date: 12.30.19 Time: 1015 Location: [address]  Group Topic: Anger Management  Goal Area(s) Addresses:  Pt will identify things that cause them to get angry. Pt will identify their reaction to anger. Pt will identify things their anger has cost them. Pt will identify coping skills to dealing with anger.  Behavioral Response: Engaged  Intervention: Worksheet  Activity: Introduction to Anger Management.  Patients were to identify the things that cause their anger, their reactions to anger, the things their anger has cost them and positive coping skills they can use when angry.  Education: PharmacologistCoping Skills, Building control surveyorDischarge Planning.   Education Outcome: Acknowledges understanding/In group clarification offered/Needs additional education.   Clinical Observations/Feedback: Pt stated "my child's father, my child and family" cause her to get angry.  Pt expressed when she is angry, she screams, yells, curses and fights.  Pt stated her anger has caused her to lose jobs, damage relationships and have public altercations.  Pt identified her coping skills as "drawing, working, journal, listen to music, color and keep my mind occupied from negative energy".    Caroll RancherMarjette Shoua Ulloa, LRT/CTRS      Lillia AbedLindsay, [name] 03/08/2018 11:13 AM

## 2018-03-08 NOTE — Tx Team (Signed)
Interdisciplinary Treatment and Diagnostic Plan Update  03/08/2018 Time of Session: 9:00am Glennis BrinkKapri Maready MRN: [id number]  Principal Diagnosis: Schizoaffective disorder, bipolar type [facility](HCC)  Secondary Diagnoses: Principal Problem:   Schizoaffective disorder, bipolar type (HCC)   Current Medications:  Current Facility-Administered Medications  Medication Dose Route Frequency Provider Last Rate Last Dose  . acetaminophen (TYLENOL) tablet 650 mg  650 mg Oral Q6H PRN Charm RingsLord, Jamison Y, NP   650 mg at 03/08/18 0825  . ARIPiprazole (ABILIFY) tablet 5 mg  5 mg Oral Daily Cobos, Rockey SituFernando A, MD   5 mg at 03/08/18 0815  . ARIPiprazole ER (ABILIFY MAINTENA) injection 400 mg  400 mg Intramuscular Q28 days Malvin JohnsFarah, Brian, MD      . haloperidol (HALDOL) tablet 5 mg  5 mg Oral Q6H PRN Cobos, Rockey SituFernando A, MD   5 mg at 03/08/18 0019   And  . benztropine (COGENTIN) tablet 1 mg  1 mg Oral Q6H PRN Cobos, Rockey SituFernando A, MD   1 mg at 03/08/18 0019  . LORazepam (ATIVAN) tablet 1 mg  1 mg Oral Q6H PRN Cobos, Rockey SituFernando A, MD   1 mg at 03/07/18 2045   Or  . LORazepam (ATIVAN) injection 1 mg  1 mg Intramuscular Q6H PRN Cobos, Fernando A, MD      . magnesium hydroxide (MILK OF MAGNESIA) suspension 30 mL  30 mL Oral Daily PRN Charm RingsLord, Jamison Y, NP      . nicotine (NICODERM CQ - dosed in mg/24 hours) patch 21 mg  21 mg Transdermal Daily Malvin JohnsFarah, Brian, MD   21 mg at 03/08/18 0816  . ondansetron (ZOFRAN) tablet 4 mg  4 mg Oral Q8H PRN Maryagnes AmosStarkes-Perry, Takia S, FNP   4 mg at 03/07/18 1535  . valACYclovir (VALTREX) tablet 1,000 mg  1,000 mg Oral Daily Nira ConnBerry, Jason A, NP   1,000 mg at 03/08/18 0815   PTA Medications: Medications Prior to Admission  Medication Sig Dispense Refill Last Dose  . dicyclomine (BENTYL) 20 MG tablet Take 1 tablet (20 mg total) by mouth 2 (two) times daily. (Patient not taking: Reported on 03/05/2018) 20 tablet 0 Not Taking at Unknown time  . haloperidol (HALDOL) 5 MG tablet Take 1 tablet (5 mg total) by mouth 2  (two) times daily. (Patient not taking: Reported on 03/05/2018) 30 tablet 0 Not Taking at Unknown time  . hydrOXYzine (ATARAX/VISTARIL) 25 MG tablet Take 1 tablet (25 mg total) by mouth 2 (two) times daily. 30 tablet 0 summer at Unknown time  . ibuprofen (ADVIL,MOTRIN) 800 MG tablet Take 1 tablet (800 mg total) by mouth 3 (three) times daily. (Patient taking differently: Take 400-600 mg by mouth every 6 (six) hours as needed for headache or mild pain. ) 21 tablet 0 Past Month at Unknown time  . lidocaine (LIDODERM) 5 % Place 1 patch onto the skin daily. Remove & Discard patch within 12 hours or as directed by MD (Patient not taking: Reported on 01/12/2018) 10 patch 0 Not Taking at Unknown time  . polyethylene glycol powder (GLYCOLAX/MIRALAX) powder Take 17 g by mouth 2 (two) times daily. Mix 1 capful in 10 oz of fluid and drink 2 times daily (Patient not taking: Reported on 03/05/2018) 255 g 0 Not Taking at Unknown time  . [DISCONTINUED] ARIPiprazole (ABILIFY) 10 MG tablet Take 10 mg by mouth daily.   summer at Unknown time    Patient Stressors: Medication change or noncompliance  Patient Strengths: Physical Health Supportive family/friends  Treatment Modalities: Medication Management, Group  therapy, Case management,  1 to 1 session with clinician, Psychoeducation, Recreational therapy.   Physician Treatment Plan for Primary Diagnosis: Schizoaffective disorder, bipolar type (HCC) Long Term Goal(s): Improvement in symptoms so as ready for discharge Improvement in symptoms so as ready for discharge   Short Term Goals: Ability to identify changes in lifestyle to reduce recurrence of condition will improve Ability to maintain clinical measurements within normal limits will improve Ability to identify changes in lifestyle to reduce recurrence of condition will improve Ability to verbalize feelings will improve Ability to disclose and discuss suicidal ideas Ability to demonstrate self-control will  improve Ability to identify and develop effective coping behaviors will improve Ability to maintain clinical measurements within normal limits will improve  Medication Management: Evaluate patient's response, side effects, and tolerance of medication regimen.  Therapeutic Interventions: 1 to 1 sessions, Unit Group sessions and Medication administration.  Evaluation of Outcomes: Adequate for Discharge  Physician Treatment Plan for Secondary Diagnosis: Principal Problem:   Schizoaffective disorder, bipolar type (HCC)  Long Term Goal(s): Improvement in symptoms so as ready for discharge Improvement in symptoms so as ready for discharge   Short Term Goals: Ability to identify changes in lifestyle to reduce recurrence of condition will improve Ability to maintain clinical measurements within normal limits will improve Ability to identify changes in lifestyle to reduce recurrence of condition will improve Ability to verbalize feelings will improve Ability to disclose and discuss suicidal ideas Ability to demonstrate self-control will improve Ability to identify and develop effective coping behaviors will improve Ability to maintain clinical measurements within normal limits will improve     Medication Management: Evaluate patient's response, side effects, and tolerance of medication regimen.  Therapeutic Interventions: 1 to 1 sessions, Unit Group sessions and Medication administration.  Evaluation of Outcomes: Adequate for Discharge   RN Treatment Plan for Primary Diagnosis: Schizoaffective disorder, bipolar type (HCC) Long Term Goal(s): Knowledge of disease and therapeutic regimen to maintain health will improve  Short Term Goals: Ability to remain free from injury will improve, Ability to demonstrate self-control, Ability to participate in decision making will improve, Ability to verbalize feelings will improve, Ability to disclose and discuss suicidal ideas and Compliance with  prescribed medications will improve  Medication Management: RN will administer medications as ordered by provider, will assess and evaluate patient's response and provide education to patient for prescribed medication. RN will report any adverse and/or side effects to prescribing provider.  Therapeutic Interventions: 1 on 1 counseling sessions, Psychoeducation, Medication administration, Evaluate responses to treatment, Monitor vital signs and CBGs as ordered, Perform/monitor CIWA, COWS, AIMS and Fall Risk screenings as ordered, Perform wound care treatments as ordered.  Evaluation of Outcomes: Adequate for Discharge   LCSW Treatment Plan for Primary Diagnosis: Schizoaffective disorder, bipolar type (HCC) Long Term Goal(s): Safe transition to appropriate next level of care at discharge, Engage patient in therapeutic group addressing interpersonal concerns.  Short Term Goals: Engage patient in aftercare planning with referrals and resources, Increase social support, Increase ability to appropriately verbalize feelings, Increase emotional regulation and Increase skills for wellness and recovery  Therapeutic Interventions: Assess for all discharge needs, 1 to 1 time with Social worker, Explore available resources and support systems, Assess for adequacy in community support network, Educate family and significant other(s) on suicide prevention, Complete Psychosocial Assessment, Interpersonal group therapy.  Evaluation of Outcomes: Adequate for Discharge   Progress in Treatment: Attending groups: Yes. Participating in groups: Yes. Taking medication as prescribed: Yes. Toleration medication: Yes. Family/Significant  other contact made: Yes, individual(s) contacted:  4 attempts with mom. Complete SPE with patient at discharge. Patient understands diagnosis: Yes. Discussing patient identified problems/goals with staff: Yes. Medical problems stabilized or resolved: Yes. Denies suicidal/homicidal  ideation: Yes. Issues/concerns per patient self-inventory: No.  New problem(s) identified: No, Describe:  none  New Short Term/Long Term Goal(s): medication management for mood stabilization; elimination of SI thoughts; development of comprehensive mental wellness/sobriety plan.  Patient Goals: "Take my meds."  Discharge Plan or Barriers: MHAG pamphlet, Mobile Crisis information, and AA/NA information provided to patient for additional community support and resources.   Reason for Continuation of Hospitalization: Anxiety Depression  Estimated Length of Stay: discharge today  Attendees: Patient: 03/08/2018 11:05 AM  Physician: Dr.Farah 03/08/2018 11:05 AM  Nursing: Arlyss Repress, RN 03/08/2018 11:05 AM  RN Care Manager: 03/08/2018 11:05 AM  Social Worker: Enid Cutter, LCSWA 03/08/2018 11:05 AM  Recreational Therapist:  03/08/2018 11:05 AM  Other:  03/08/2018 11:05 AM  Other:  03/08/2018 11:05 AM  Other: 03/08/2018 11:05 AM    Scribe for Treatment Team: Darreld Mclean, LCSWA 03/08/2018 11:05 AM

## 2018-03-08 NOTE — Progress Notes (Signed)
D:  Alveda ReasonsKapri was up and visible on the unit this evening.  She denied SI/HI or A/V.   She reported having an outbreak of genital herpes.  Staff with Barbara CowerJason NP and new order noted.  She was noted to be loud and arguing on the phone.  She was easily redirected and was able to calm down and apologize.  She had difficulty falling asleep due to peer loud and pacing the floor outside her room.  She did come up for extra medications to help her calm down.  She is currently resting with her eyes closed and appears to be asleep. A:  1:1 with RN for support and encouragement.  Medications as ordered.  Q 15 minute checks maintained for safety.  Encouraged participation in group and unit activities.   R:  Cambelle remains safe on the unit.  We will continue to monitor the progress towards her goals.

## 2018-03-08 NOTE — Progress Notes (Signed)
  [facility] :  Will you be returning to the same living situation after discharge:  Yes,  home At discharge, do you have transportation home?: Yes,  friend picking up around 12:30pm Do you have the ability to pay for your medications: Yes,  Medicaid  Release of information consent forms completed and in the chart. Work letter for patient on chart.  Patient to Follow up at: Follow-up Information    [facility]. Go on 03/11/2018.   Why:  Please attend your hospital discharge appointment 03/11/18 at 8:00am Contact information: [address] [place] [address] [phone]           Next level of care provider [facility] Link:no  Safety Planning and Suicide Prevention discussed: Yes,  4 attempts to reach mom and legal guardian. SPE to be completed with patient at discharge.  Have you used any form of tobacco in the last 30 days? (Cigarettes, Smokeless Tobacco, Cigars, and/or Pipes): Yes  Has patient been referred to the Quitline?: Patient refused referral  Patient has been referred for addiction treatment: Yes  Darreld McleanCharlotte C Celise Bazar, LCSWA 03/08/2018, 11:40 AM

## 2018-03-08 NOTE — [facility] Suicide Risk Assessment (Signed)
[facility]   Principal Problem: Schizoaffective disorder, bipolar type [facility](HCC) Discharge Diagnoses: Principal Problem:   Schizoaffective disorder, bipolar type (HCC)   Total Time spent with patient: 45 minutes  Alert fully oriented cooperative but very intrusive and very focused on discharge but not manic that I can discern a denies wanting to harm self or others denies auditory and visual hallucinations agrees to long-acting injectable and compliance   Mental Status Per Nursing Assessment::   On Admission:  NA  Demographic Factors:  Unemployed  Loss Factors: Decrease in vocational status  Historical Factors: Impulsivity  Risk Reduction Factors:   Religious beliefs about death  Continued Clinical Symptoms:  Alcohol/Substance Abuse/Dependencies  Cognitive Features That Contribute To Risk:  Polarized thinking    Suicide Risk:  Minimal: No identifiable suicidal ideation.  Patients presenting with no risk factors but with morbid ruminations; may be classified as minimal risk based on the severity of the depressive symptoms  Follow-up Information    [facility] Follow up.   Why:  A call will be made to see if you can be readmitted to the ACTT Team. Contact information: [address] [place] [address] [phone]        [facility] Follow up.   Contact information: [address] [place] [address] [phone]           Plan Of Care/Follow-up recommendations:  Activity:  full  Shana Zavaleta, MD 03/08/2018, 10:09 AM

## 2018-03-08 NOTE — Progress Notes (Signed)
D: Pt A & O to self, place, time and situations. Denies SI, HI, AVH and pain at this time. Presents animated and pleasant on interactions. D/C home as ordered. Picked up in lobby by "my mom's best friend".  A: D/C instructions reviewed with pt including prescriptions and follow up appointments; compliance encouraged. All belongings from assigned locker given to pt at time of departure. Scheduled medications given with verbal education and effects monitored. Safety checks maintained without incident till time of d/c.  R: Pt receptive to care. Compliant with medications when offered. Denies adverse drug reactions when assessed. Verbalized understanding related to d/c instructions. Signed belonging sheet in agreement with items received from locker. Ambulatory with a steady gait. Appears to be in no physical distress at time of departure.

## 2018-03-08 NOTE — Discharge Summary (Signed)
Physician Discharge Summary Note  Patient:  Betty Parrish is an 27 y.o., female MRN:  [id number] DOB:  07-Feb-1991 Patient phone:  [phone] (home)  Patient address[place]   [address] [place] [id number],  Total Time spent with patient: 45 minutes  Date of Admission:  03/05/2018 Date of Discharge: 03/08/18  Reason for Admission:    Patient had been noncompliant with her antipsychotic/mood stabilizer medication had an exacerbation of underlying bipolar condition, also had been abusing cannabis which is a chronic issue for her she denied other drugs of abuse drug screen only showed cannabis. Principal Problem: Schizoaffective disorder, bipolar type [facility](HCC) Discharge Diagnoses: Principal Problem:   Schizoaffective disorder, bipolar type [facility](HCC)   Past Psychiatric History: extensive/complicated by polysubstance abuse chronically Past Medical History:  Past Medical History:  Diagnosis Date  . ADHD (attention deficit hyperactivity disorder)   . Anxiety   . Heart murmur   . Herpes   . Schizo-affective psychosis (HCC)   . Sickle cell trait (HCC)    History reviewed. No pertinent surgical history. Family History:  Family History  Problem Relation Age of Onset  . Sickle cell anemia Mother   Social History:  Social History   Substance and Sexual Activity  Alcohol Use No  . Frequency: Never     Social History   Substance and Sexual Activity  Drug Use Yes  . Types: Marijuana, Cocaine   Comment: last used marijuana yesterday, denies cocaine use    Social History   Socioeconomic History  . Marital status: Significant Other    Spouse name: Not on file  . Number of children: Not on file  . Years of education: Not on file  . Highest education level: Not on file  Occupational History  . Not on file  Social Needs  . Financial resource strain: Not on file  . Food insecurity:    Worry: Not on file    Inability: Not on file  . Transportation needs:    Medical: Not on file     Non-medical: Not on file  Tobacco Use  . Smoking status: Current Every Day Smoker    Packs/day: 1.00    Types: Cigarettes  . Smokeless tobacco: Never Used  Substance and Sexual Activity  . Alcohol use: No    Frequency: Never  . Drug use: Yes    Types: Marijuana, Cocaine    Comment: last used marijuana yesterday, denies cocaine use  . Sexual activity: Yes    Birth control/protection: I.U.D.  Lifestyle  . Physical activity:    Days per week: Not on file    Minutes per session: Not on file  . Stress: Not on file  Relationships  . Social connections:    Talks on phone: Not on file    Gets together: Not on file    Attends religious service: Not on file    Active member of club or organization: Not on file    Attends meetings of clubs or organizations: Not on file    Relationship status: Not on file  Other Topics Concern  . Not on file  Social History Narrative  . Not on file    Hospital Course:    Once here the patient was restarted on her aripiprazole, she got good sleep and went through no withdrawal symptoms so her initial presentation of psychosis resolved very quickly by the time I picked uppercase on Monday she was noted to be alert oriented to person place time situation generally cooperative but  very intrusive and requesting discharge but not manic.  We discussed that she had act team services before but she states they did not help her, however in the context of the act team we simply could not keep her from a binging on cocaine or cannabis and disappearing for days despite frequent visits with the caseworkers nurses so forth at any rate she at this point is requesting discharge and is not dangerous or psychotic or manic at this point in time States she will follow-[facility] Due to noncompliance that is a chronic issue added long-acting injectable Abilify   Physical Findings: AIMS: Facial and Oral Movements Muscles of Facial Expression: None, normal Lips and  Perioral Area: None, normal Jaw: None, normal Tongue: None, normal,Extremity Movements Upper (arms, wrists, hands, fingers): None, normal Lower (legs, knees, ankles, toes): None, normal, Trunk Movements Neck, shoulders, hips: None, normal, Overall Severity Severity of abnormal movements (highest score from questions above): None, normal Incapacitation due to abnormal movements: None, normal Patient's awareness of abnormal movements (rate only patient's report): No Awareness, Dental Status Current problems with teeth and/or dentures?: No Does patient usually wear dentures?: No  CIWA:    COWS:     Musculoskeletal: Strength & Muscle Tone: within normal limits Gait & Station: normal Patient leans: N/A  Psychiatric Specialty Exam: Physical Exam  ROS  Blood pressure 113/85, pulse 67, temperature 98.2 F (36.8 C), temperature source Oral, resp. rate 18, height 5\' 7"  (1.702 m), weight 77.1 kg, SpO2 100 %.Body mass index is 26.63 kg/m.  General Appearance: Fairly Groomed  Eye Contact:  Good  Speech:  Clear and Coherent  Volume:  Normal  Mood:  Euthymic  Affect:  Congruent  Thought Process:  Coherent  Orientation:  Full (Time, Place, and Person)  Thought Content:  Logical  Suicidal Thoughts:  No  Homicidal Thoughts:  No  Memory:  Immediate;   Good  Judgement:  Fair  Insight:  Good  Psychomotor Activity:  Normal  Concentration:  Concentration: Fair  Recall:  Fair  Fund of Knowledge:  Fair  Language:  Fair  Akathisia:  Negative  Handed:  Right  AIMS (if indicated):     Assets:  Communication Skills  ADL's:  Intact  Cognition:  WNL  Sleep:  Number of Hours: 2.25     Have you used any form of tobacco in the last 30 days? (Cigarettes, Smokeless Tobacco, Cigars, and/or Pipes): Yes  Has this patient used any form of tobacco in the last 30 days? (Cigarettes, Smokeless Tobacco, Cigars, and/or Pipes) Yes, No  Blood Alcohol level:  Lab Results  Component Value Date   ETH <10  03/05/2018   ETH <10 08/30/2017    Metabolic Disorder Labs:  No results found for: HGBA1C, MPG No results found for: PROLACTIN No results found for: CHOL, TRIG, HDL, CHOLHDL, VLDL, LDLCALC  See Psychiatric Specialty Exam and Suicide Risk Assessment completed by Attending Physician prior to discharge.  Discharge destination:  Home  Is patient on multiple antipsychotic therapies at discharge:  No   Has Patient had three or more failed trials of antipsychotic monotherapy by history:  No  Recommended Plan for Multiple Antipsychotic Therapies: NA   Allergies as of 03/08/2018      Reactions   Red Dye Hives   Hawaiian punch   Zyprexa [olanzapine] Other (See Comments)   Muscle pain and jerking       Medication List    STOP taking these medications   dicyclomine 20 MG tablet  Commonly known as:  BENTYL   haloperidol 5 MG tablet Commonly known as:  HALDOL   hydrOXYzine 25 MG tablet Commonly known as:  ATARAX/VISTARIL   ibuprofen 800 MG tablet Commonly known as:  ADVIL,MOTRIN   lidocaine 5 % Commonly known as:  LIDODERM   polyethylene glycol powder powder Commonly known as:  GLYCOLAX/MIRALAX     TAKE these medications     Indication  ARIPiprazole 10 MG tablet Commonly known as:  ABILIFY Take 1 tablet (10 mg total) by mouth daily for 14 days. What changed:  Another medication with the same name was added. Make sure you understand how and when to take each.  Indication:  MIXED BIPOLAR AFFECTIVE DISORDER   ARIPiprazole ER 400 MG Srer injection Commonly known as:  ABILIFY MAINTENA Inject 2 mLs (400 mg total) into the muscle every 28 (twenty-eight) days. What changed:  You were already taking a medication with the same name, and this prescription was added. Make sure you understand how and when to take each.  Indication:  MIXED BIPOLAR AFFECTIVE DISORDER   valACYclovir 1000 MG tablet Commonly known as:  VALTREX Take 1 tablet (1,000 mg total) by mouth daily for 5  days. Start taking on:  March 09, 2018  Indication:  Herpes Simplex Infection      Follow-up Information    [facility] Follow up.   Why:  A call will be made to see if you can be readmitted to the ACTT Team. Contact information: [address] [place] [address] [phone]        [facility] Follow up.   Contact information: [address] [place] [address] [phone]           Follow-up recommendations:  Activity:  ful;  Comments:   Final diagnosis schizoaffective bipolar type manic with psychosis/cannabis dependence and abuse, generally stable for release Added long-acting injectable prior to discharge Abilify 400 mg today due to recent noncompliance  SignedMalvin Johns: Orrin Yurkovich, MD 03/08/2018, 10:12 AM

## 2018-03-08 NOTE — Progress Notes (Addendum)
Patient asked to speak with CSW in [place] regarding discharge planning. Patient hoping to discharge today, CSW not aware of scheduled discharge for this date.   Of note, patient has a legal guardian (mother, [name], [phone]). Two attempts to reach legal guardian without success, voicemails left.   Patient agreeable to resuming services with [facility]. CSW to make referral.     Update 10:48am Patient expected to discharge today. CSW provided two other phone numbers for legal guardian by patient [phone]). CSW left voicemails on each line. The legal guardian has been attempted to reach 4x.   Patient [facility] and a letter for work. CSW to schedule follow up.  Betty Cutterharlotte Jax Abdelrahman, LCSW-A Clinical Social Worker

## 2018-03-08 NOTE — [facility] Suicide Risk Assessment (Signed)
[facility] INPATIENT:  Family/Significant Other Suicide Prevention Education   Suicide Prevention Education:  Contact Attempts: mother and legal guardian [name] [phone], (name of family member/significant other) has been identified by the patient as the family member/significant other with whom the patient will be residing, and identified as the person(s) who will aid the patient in the event of a mental health crisis.  With written consent from the patient, two attempts were made to provide suicide prevention education, prior to and/or following the patient's discharge.  We were unsuccessful in providing suicide prevention education.  A suicide education pamphlet was given to the patient to share with family/significant other.  Date and time of first attempt: 03/07/18 at 11:59 am - voicemail says another name no vm left. Date and time of second attempt: 03/08/18 at 10:00am. No name on VM, left HIPPA compliant VM requesting returned call.  Enid Cutterharlotte Alejos Reinhardt, LCSW-A Clinical Social Worker

## 2018-03-08 NOTE — Plan of Care (Signed)
  Problem: Safety: Goal: Ability to redirect hostility and anger into socially appropriate behaviors will improve Outcome: Progressing   Problem: Education: Goal: Emotional status will improve Outcome: Progressing

## 2018-07-05 ENCOUNTER — Encounter (HOSPITAL_COMMUNITY): Payer: Self-pay | Admitting: Emergency Medicine

## 2018-07-05 ENCOUNTER — Other Ambulatory Visit: Payer: Self-pay

## 2018-07-05 ENCOUNTER — Emergency Department (HOSPITAL_COMMUNITY)
Admission: EM | Admit: 2018-07-05 | Discharge: 2018-07-05 | Disposition: A | Discharge status: Home / Self Care | Payer: Medicaid Other | Attending: Emergency Medicine | Admitting: Emergency Medicine

## 2018-07-05 DIAGNOSIS — F17210 Nicotine dependence, cigarettes, uncomplicated: Secondary | ICD-10-CM | POA: Diagnosis not present

## 2018-07-05 DIAGNOSIS — Z760 Encounter for issue of repeat prescription: Secondary | ICD-10-CM

## 2018-07-05 MED ORDER — ARIPIPRAZOLE 10 MG PO TABS: 10.0000 mg | ORAL_TABLET | Freq: Every day | ORAL | 1 refills | Status: DC

## 2018-07-05 MED ORDER — TRAZODONE HCL 50 MG PO TABS: 50.0000 mg | ORAL_TABLET | Freq: Every day | ORAL | 0 refills | Status: DC

## 2018-07-05 NOTE — ED Notes (Signed)
Pt is alert and oriented x 4 and is verbally responsive. Pt denies pain at this time.  

## 2018-07-05 NOTE — Discharge Instructions (Signed)
Prescriptions for Abilify and trazodone have been provided as a Research officer, political party.  Please follow-up with [facility] for future refills.

## 2018-07-05 NOTE — ED Triage Notes (Signed)
Pt reports needs refill of trazodone and Abilify. Reports went to [facility] but couldn't get it since missed an appt with them. Doesn't have ACT team yet.

## 2018-07-05 NOTE — ED Provider Notes (Signed)
[facility]-EMER[facility]PT Provider Note   CSN: [id number] Arrival date & time: 07/05/18  1307    History   Chief Complaint Chief Complaint  Patient presents with  . Medication Refill    HPI Betty Parrish is a 28 y.o. female.     Patient is a 28 year old female with history of ADHD, schizoaffective disorder, anxiety.  She presents today for a refill of her medications.  She states that she has been out of her trazodone and Abilify since this morning.  This is normally prescribed by [facility], however she missed her appointment due to the ongoing pandemic.  She has no other complaints.  The history is provided by the patient.  Medication Refill  Reason for request:  Medications ran out   Past Medical History:  Diagnosis Date  . ADHD (attention deficit hyperactivity disorder)   . Anxiety   . Heart murmur   . Herpes   . Schizo-affective psychosis (HCC)   . Sickle cell trait [facility])     Patient Active Problem List   Diagnosis Date Noted  . Cannabis abuse   . Bipolar disorder, manic phase (HCC) 09/30/2014  . Schizoaffective disorder, bipolar type (HCC) 09/29/2014  . Mania (HCC) 12/18/2012    History reviewed. No pertinent surgical history.   OB History   No obstetric history on file.      Home Medications    Prior to Admission medications   Medication Sig Start Date End Date Taking? Authorizing Provider  ARIPiprazole (ABILIFY) 10 MG tablet Take 1 tablet (10 mg total) by mouth daily for 14 days. 03/08/18 03/22/18  Malvin Johns, MD  ARIPiprazole ER (ABILIFY MAINTENA) 400 MG SRER injection Inject 2 mLs (400 mg total) into the muscle every 28 (twenty-eight) days. 03/08/18   Malvin Johns, MD    Family History Family History  Problem Relation Age of Onset  . Sickle cell anemia Mother     Social History Social History   Tobacco Use  . Smoking status: Current Every Day Smoker    Packs/day: 1.00    Types: Cigarettes  . Smokeless tobacco: Never  Used  Substance Use Topics  . Alcohol use: No    Frequency: Never  . Drug use: Yes    Types: Marijuana, Cocaine    Comment: last used marijuana yesterday, denies cocaine use     Allergies   Red dye and Zyprexa [olanzapine]   Review of Systems Review of Systems  All other systems reviewed and are negative.    Physical Exam Updated Vital Signs BP 122/78 (BP Location: Left Arm)   Pulse 99   Temp 98.8 F (37.1 C) (Oral)   Resp 18   SpO2 100%   Physical Exam Vitals signs and nursing note reviewed.  Constitutional:      General: She is not in acute distress.    Appearance: Normal appearance. She is not ill-appearing.  HENT:     Head: Normocephalic and atraumatic.  Pulmonary:     Effort: Pulmonary effort is normal.  Skin:    General: Skin is warm and dry.  Neurological:     Mental Status: She is alert and oriented to person, place, and time.      ED Treatments / Results  Labs (all labs ordered are listed, but only abnormal results are displayed) Labs Reviewed - No data to display  EKG None  Radiology No results found.  Procedures Procedures (including critical care time)  Medications Ordered in ED Medications - No data to  display   Initial Impression / Assessment and Plan / ED Course  I have reviewed the triage vital signs and the nursing notes.  Pertinent labs & imaging results that were available during my care of the patient were reviewed by me and considered in my medical decision making (see chart for details).  Medication refills provided at patient's request.  [facility].  Final Clinical Impressions(s) / ED Diagnoses   Final diagnoses:  None    ED Discharge Orders    None       Geoffery Lyonselo, Adama Ivins, MD 07/05/18 1357

## 2018-07-12 ENCOUNTER — Emergency Department (HOSPITAL_COMMUNITY)
Admission: EM | Admit: 2018-07-12 | Discharge: 2018-07-12 | Disposition: A | Discharge status: Home / Self Care | Payer: Medicaid Other | Attending: Emergency Medicine | Admitting: Emergency Medicine

## 2018-07-12 ENCOUNTER — Encounter (HOSPITAL_COMMUNITY): Payer: Self-pay

## 2018-07-12 ENCOUNTER — Other Ambulatory Visit: Payer: Self-pay

## 2018-07-12 DIAGNOSIS — Z3200 Encounter for pregnancy test, result unknown: Secondary | ICD-10-CM | POA: Diagnosis not present

## 2018-07-12 DIAGNOSIS — Z760 Encounter for issue of repeat prescription: Secondary | ICD-10-CM | POA: Diagnosis not present

## 2018-07-12 DIAGNOSIS — Z5321 Procedure and treatment not carried out due to patient leaving prior to being seen by health care provider: Secondary | ICD-10-CM | POA: Insufficient documentation

## 2018-07-12 LAB — PREGNANCY, URINE: Preg Test, Ur: NEGATIVE

## 2018-07-12 NOTE — ED Notes (Signed)
Pt asked registration if they could print out her IRS tax refund information for her. when registration told her they could not do that for her, she gave her stickers back and left.

## 2018-07-12 NOTE — ED Triage Notes (Signed)
Pt states that she needs a refill of Abilify 10 mg, trazadone 50 mg, ambien. Pt unable to f/u with [facility].  Pt also wants a pregnancy test. Pt states she has a Mirena in place, but it expired 3 years ago. Has not taken home preg test yet.

## 2019-03-25 ENCOUNTER — Emergency Department (HOSPITAL_COMMUNITY)
Admission: EM | Admit: 2019-03-25 | Discharge: 2019-03-26 | Disposition: A | Discharge status: Home / Self Care | Payer: Medicaid Other | Attending: Emergency Medicine | Admitting: Emergency Medicine

## 2019-03-25 ENCOUNTER — Other Ambulatory Visit: Payer: Self-pay

## 2019-03-25 DIAGNOSIS — F1290 Cannabis use, unspecified, uncomplicated: Secondary | ICD-10-CM | POA: Diagnosis not present

## 2019-03-25 DIAGNOSIS — R440 Auditory hallucinations: Secondary | ICD-10-CM | POA: Diagnosis present

## 2019-03-25 DIAGNOSIS — Z915 Personal history of self-harm: Secondary | ICD-10-CM | POA: Insufficient documentation

## 2019-03-25 DIAGNOSIS — R45851 Suicidal ideations: Secondary | ICD-10-CM | POA: Insufficient documentation

## 2019-03-25 DIAGNOSIS — Z79899 Other long term (current) drug therapy: Secondary | ICD-10-CM | POA: Diagnosis not present

## 2019-03-25 DIAGNOSIS — D573 Sickle-cell trait: Secondary | ICD-10-CM | POA: Diagnosis not present

## 2019-03-25 DIAGNOSIS — F319 Bipolar disorder, unspecified: Secondary | ICD-10-CM | POA: Diagnosis not present

## 2019-03-25 DIAGNOSIS — F17210 Nicotine dependence, cigarettes, uncomplicated: Secondary | ICD-10-CM | POA: Diagnosis not present

## 2019-03-25 DIAGNOSIS — F250 Schizoaffective disorder, bipolar type: Secondary | ICD-10-CM | POA: Insufficient documentation

## 2019-03-25 DIAGNOSIS — Z20822 Contact with and (suspected) exposure to covid-19: Secondary | ICD-10-CM | POA: Insufficient documentation

## 2019-03-25 LAB — CBC WITH DIFFERENTIAL/PLATELET
Abs Immature Granulocytes: 0.02 10*3/uL (ref 0.00–0.07)
Basophils Absolute: 0 10*3/uL (ref 0.0–0.1)
Basophils Relative: 1 %
Eosinophils Absolute: 0.1 10*3/uL (ref 0.0–0.5)
Eosinophils Relative: 1 %
HCT: 37.8 % (ref 36.0–46.0)
Hemoglobin: 13.5 g/dL (ref 12.0–15.0)
Immature Granulocytes: 0 %
Lymphocytes Relative: 23 %
Lymphs Abs: 1.7 10*3/uL (ref 0.7–4.0)
MCH: 32.8 pg (ref 26.0–34.0)
MCHC: 35.7 g/dL (ref 30.0–36.0)
MCV: 92 fL (ref 80.0–100.0)
Monocytes Absolute: 0.7 10*3/uL (ref 0.1–1.0)
Monocytes Relative: 10 %
Neutro Abs: 4.6 10*3/uL (ref 1.7–7.7)
Neutrophils Relative %: 65 %
Platelets: 272 10*3/uL (ref 150–400)
RBC: 4.11 MIL/uL (ref 3.87–5.11)
RDW: 12.8 % (ref 11.5–15.5)
WBC: 7.2 10*3/uL (ref 4.0–10.5)
nRBC: 0 % (ref 0.0–0.2)

## 2019-03-25 LAB — COMPREHENSIVE METABOLIC PANEL
ALT: 23 U/L (ref 0–44)
AST: 21 U/L (ref 15–41)
Albumin: 4.4 g/dL (ref 3.5–5.0)
Alkaline Phosphatase: 55 U/L (ref 38–126)
Anion gap: 12 (ref 5–15)
BUN: 6 mg/dL (ref 6–20)
CO2: 24 mmol/L (ref 22–32)
Calcium: 9.3 mg/dL (ref 8.9–10.3)
Chloride: 103 mmol/L (ref 98–111)
Creatinine, Ser: 0.59 mg/dL (ref 0.44–1.00)
GFR calc Af Amer: >60 mL/min (ref >60)
GFR calc non Af Amer: >60 mL/min (ref >60)
Glucose, Bld: 110 mg/dL — ABNORMAL HIGH (ref 70–99)
Potassium: 2.9 mmol/L — ABNORMAL LOW (ref 3.5–5.1)
Sodium: 139 mmol/L (ref 135–145)
Total Bilirubin: 1.6 mg/dL — ABNORMAL HIGH (ref 0.3–1.2)
Total Protein: 7.4 g/dL (ref 6.5–8.1)

## 2019-03-25 LAB — RESPIRATORY PANEL BY RT PCR (FLU A&B, COVID)
Influenza A by PCR: NEGATIVE
Influenza B by PCR: NEGATIVE
SARS Coronavirus 2 by RT PCR: NEGATIVE

## 2019-03-25 LAB — I-STAT BETA HCG BLOOD, ED (MC, WL, AP ONLY): I-stat hCG, quantitative: <5 m[IU]/mL (ref <5)

## 2019-03-25 LAB — SALICYLATE LEVEL: Salicylate Lvl: <7 mg/dL — ABNORMAL LOW (ref 7.0–30.0)

## 2019-03-25 LAB — ETHANOL: Alcohol, Ethyl (B): <10 mg/dL (ref <10)

## 2019-03-25 LAB — ACETAMINOPHEN LEVEL: Acetaminophen (Tylenol), Serum: <10 ug/mL — ABNORMAL LOW (ref 10–30)

## 2019-03-25 MED ORDER — ARIPIPRAZOLE 10 MG PO TABS: 10.0000 mg | ORAL_TABLET | Freq: Every day | ORAL | Status: DC

## 2019-03-25 MED ORDER — NICOTINE 21 MG/24HR TD PT24: 21.0000 mg | MEDICATED_PATCH | Freq: Every day | TRANSDERMAL | Status: DC

## 2019-03-25 MED ORDER — ONDANSETRON HCL 4 MG PO TABS: 4.0000 mg | ORAL_TABLET | Freq: Three times a day (TID) | ORAL | Status: DC | PRN

## 2019-03-25 MED ORDER — POTASSIUM CHLORIDE CRYS ER 20 MEQ PO TBCR
40.0000 meq | EXTENDED_RELEASE_TABLET | Freq: Once | ORAL | Status: AC
  Administered 2019-03-26: 40 meq via ORAL
  Filled 2019-03-25: qty 2

## 2019-03-25 MED ORDER — ACETAMINOPHEN 325 MG PO TABS: 650.0000 mg | ORAL_TABLET | ORAL | Status: DC | PRN

## 2019-03-25 MED ORDER — ALUM & MAG HYDROXIDE-SIMETH 200-200-20 MG/5ML PO SUSP: 30.0000 mL | Freq: Four times a day (QID) | ORAL | Status: DC | PRN

## 2019-03-25 NOTE — ED Provider Notes (Signed)
[facility]EM[facility]PT Provider Note   CSN: [id number] Arrival date & time: 03/25/19  1849     History No chief complaint on file.   Betty Parrish is a 29 y.o. female.  The history is provided by the patient. No language interpreter was used.     29 year old female with history of schizoaffective disorder, ADHD, anxiety, sickle cell trait, presenting with report of auditory hallucination and request to have psychiatric medication adjusted.  Patient is a poor historian.  She mention she hears voices telling her to harm herself even though she does not have any intention of harming herself.  She reported in the past she had Abilify injection that worked well.  She is currently taking oral medication in which she tries to be compliance but would prefer to defer her medication to be changed for better mood stability.  She endorsed eating more and sleeping less.  She expressed her psychiatric illness to her mom who recommend patient come to the ER to request help.  She denies any homicidal ideation.  She denies prior self-harm.  She admits to using marijuana on a daily basis.  She is unable to recall her last menstrual period.  She has no other complaint.  Past Medical History:  Diagnosis Date  . ADHD (attention deficit hyperactivity disorder)   . Anxiety   . Heart murmur   . Herpes   . Schizo-affective psychosis (HCC)   . Sickle cell trait [facility])     Patient Active Problem List   Diagnosis Date Noted  . Cannabis abuse   . Bipolar disorder, manic phase (HCC) 09/30/2014  . Schizoaffective disorder, bipolar type (HCC) 09/29/2014  . Mania (HCC) 12/18/2012    No past surgical history on file.   OB History   No obstetric history on file.     Family History  Problem Relation Age of Onset  . Sickle cell anemia Mother     Social History   Tobacco Use  . Smoking status: Current Every Day Smoker    Packs/day: 1.00    Types: Cigarettes  . Smokeless tobacco:  Never Used  Substance Use Topics  . Alcohol use: No  . Drug use: Yes    Types: Marijuana, Cocaine    Comment: last used marijuana yesterday, denies cocaine use    Home Medications Prior to Admission medications   Medication Sig Start Date End Date Taking? Authorizing Provider  ARIPiprazole (ABILIFY) 10 MG tablet Take 1 tablet (10 mg total) by mouth daily for 28 days. 07/05/18 08/02/18  Geoffery Lyons, MD  traZODone (DESYREL) 50 MG tablet Take 1 tablet (50 mg total) by mouth at bedtime. 07/05/18   Geoffery Lyons, MD    Allergies    Red dye and Zyprexa [olanzapine]  Review of Systems   Review of Systems  All other systems reviewed and are negative.   Physical Exam Updated Vital Signs BP 129/70 (BP Location: Left Arm)   Pulse 70   Temp 98.4 F (36.9 C) (Oral)   Resp 15   LMP  (LMP Unknown)   SpO2 97%   Physical Exam Vitals and nursing note reviewed.  Constitutional:      General: She is not in acute distress.    Appearance: She is well-developed. She is obese.  HENT:     Head: Atraumatic.  Eyes:     Conjunctiva/sclera: Conjunctivae normal.  Cardiovascular:     Rate and Rhythm: Normal rate and regular rhythm.     Pulses: Normal pulses.  Heart sounds: Normal heart sounds.  Pulmonary:     Effort: Pulmonary effort is normal.     Breath sounds: Normal breath sounds.  Abdominal:     Palpations: Abdomen is soft.     Tenderness: There is no abdominal tenderness.  Musculoskeletal:     Cervical back: Neck supple.  Skin:    Findings: No rash.  Neurological:     Mental Status: She is alert and oriented to person, place, and time.     GCS: GCS eye subscore is 4. GCS verbal subscore is 5. GCS motor subscore is 6.  Psychiatric:        Mood and Affect: Mood normal.        Speech: Speech normal.        Behavior: Behavior is cooperative.        Thought Content: Thought content does not include homicidal or suicidal ideation.     ED Results / Procedures / Treatments    Labs (all labs ordered are listed, but only abnormal results are displayed) Labs Reviewed  COMPREHENSIVE METABOLIC PANEL - Abnormal; Notable for the following components:      Result Value   Potassium 2.9 (*)    Glucose, Bld 110 (*)    Total Bilirubin 1.6 (*)    All other components within normal limits  ACETAMINOPHEN LEVEL - Abnormal; Notable for the following components:   Acetaminophen (Tylenol), Serum <10 (*)    All other components within normal limits  SALICYLATE LEVEL - Abnormal; Notable for the following components:   Salicylate Lvl <1.6 (*)    All other components within normal limits  RESPIRATORY PANEL BY RT PCR (FLU A&B, COVID)  ETHANOL  CBC WITH DIFFERENTIAL/PLATELET  RAPID URINE DRUG SCREEN, HOSP PERFORMED  I-STAT BETA HCG BLOOD, ED ([facility], [facility], [facility] ONLY)  POC URINE PREG, ED    EKG None  Radiology No results found.  Procedures Procedures (including critical care time)  Medications Ordered in ED Medications  acetaminophen (TYLENOL) tablet 650 mg (has no administration in time range)  ondansetron (ZOFRAN) tablet 4 mg (has no administration in time range)  alum & mag hydroxide-simeth (MAALOX/MYLANTA) 200-200-20 MG/5ML suspension 30 mL (has no administration in time range)  nicotine (NICODERM CQ - dosed in mg/24 hours) patch 21 mg (has no administration in time range)  ARIPiprazole (ABILIFY) tablet 10 mg (has no administration in time range)  potassium chloride SA (KLOR-CON) CR tablet 40 mEq (has no administration in time range)    ED Course  I have reviewed the triage vital signs and the nursing notes.  Pertinent labs & imaging results that were available during my care of the patient were reviewed by me and considered in my medical decision making (see chart for details).    MDM Rules/Calculators/A&P                      BP 129/70 (BP Location: Left Arm)   Pulse 70   Temp 98.4 F (36.9 C) (Oral)   Resp 15   LMP  (LMP Unknown)   SpO2 97%   Final  Clinical Impression(s) / ED Diagnoses Final diagnoses:  Auditory hallucination  Suicidal ideation    Rx / DC Orders ED Discharge Orders    None     8:46 PM Patient with underlying history of schizoaffective disorder here with auditory hallucination with command hallucination telling her to harm herself.  She request psychiatric help.  At this time patient is medically clear and stable  for further psychiatric assessment.  11:17 PM Potassium is 2.9, supplementation given.  Labs otherwise reassuring, COVID-19 test is currently pending, UDS have not been obtained yet.  Appreciate consultation from TTS who state patient meets [facility] obs status pending the remainder of her labs.   Betty Parrish was evaluated in Emergency Department on 03/25/2019 for the symptoms described in the history of present illness. She was evaluated in the context of the global COVID-19 pandemic, which necessitated consideration that the patient might be at risk for infection with the SARS-CoV-2 virus that causes COVID-19. Institutional protocols and algorithms that pertain to the evaluation of patients at risk for COVID-19 are in a state of rapid change based on information released by regulatory bodies including the CDC and federal and state organizations. These policies and algorithms were followed during the patient's care in the ED.    Fayrene Helper, PA-C 03/25/19 2318    Donnetta Hutching, MD 03/26/19 [phone]

## 2019-03-25 NOTE — Progress Notes (Signed)
Betty Conn, NP recommends overnight OBS pending AM psych assessment. EDP Fayrene Helper, PA-C and pt's nurse Alfonzo Feller, RN have been advised. Pt tentatively accepted to [facility] OBS unit pending negative Covid test and UDS.  Princess Bruins, MSW, LCSW Therapeutic Triage Specialist  [phone]
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
## 2019-03-25 NOTE — ED Triage Notes (Signed)
Pt from home states she lives with her 29 yr old daughter. Pt admits to hearing voices that are telling her to hurt herself. She also admits to AV hallucinations. Pt admits to using pot and ETOH denies other drugs.

## 2019-03-26 ENCOUNTER — Encounter (HOSPITAL_COMMUNITY): Payer: Self-pay | Admitting: Psychiatry

## 2019-03-26 ENCOUNTER — Observation Stay (HOSPITAL_BASED_OUTPATIENT_CLINIC_OR_DEPARTMENT_OTHER)
Admission: AD | Admit: 2019-03-26 | Discharge: 2019-03-26 | Disposition: A | Discharge status: Home / Self Care | Payer: Medicaid Other | Source: Intra-hospital | Attending: Psychiatry | Admitting: Psychiatry

## 2019-03-26 ENCOUNTER — Other Ambulatory Visit: Payer: Self-pay

## 2019-03-26 DIAGNOSIS — D573 Sickle-cell trait: Secondary | ICD-10-CM | POA: Insufficient documentation

## 2019-03-26 DIAGNOSIS — F250 Schizoaffective disorder, bipolar type: Secondary | ICD-10-CM | POA: Insufficient documentation

## 2019-03-26 DIAGNOSIS — F259 Schizoaffective disorder, unspecified: Secondary | ICD-10-CM | POA: Diagnosis present

## 2019-03-26 DIAGNOSIS — F17210 Nicotine dependence, cigarettes, uncomplicated: Secondary | ICD-10-CM | POA: Insufficient documentation

## 2019-03-26 DIAGNOSIS — Z915 Personal history of self-harm: Secondary | ICD-10-CM | POA: Insufficient documentation

## 2019-03-26 DIAGNOSIS — Z79899 Other long term (current) drug therapy: Secondary | ICD-10-CM | POA: Insufficient documentation

## 2019-03-26 LAB — RAPID URINE DRUG SCREEN, HOSP PERFORMED
Amphetamines: NOT DETECTED
Barbiturates: NOT DETECTED
Benzodiazepines: NOT DETECTED
Cocaine: NOT DETECTED
Opiates: NOT DETECTED
Tetrahydrocannabinol: POSITIVE — AB

## 2019-03-26 MED ORDER — HYDROXYZINE HCL 25 MG PO TABS: 25.0000 mg | ORAL_TABLET | Freq: Three times a day (TID) | ORAL | Status: DC | PRN

## 2019-03-26 MED ORDER — BENZOCAINE 10 % MT GEL
Freq: Two times a day (BID) | OROMUCOSAL | Status: DC | PRN
  Administered 2019-03-26: 1 via OROMUCOSAL
  Filled 2019-03-26 (×2): qty 9

## 2019-03-26 MED ORDER — ALUM & MAG HYDROXIDE-SIMETH 200-200-20 MG/5ML PO SUSP: 30.0000 mL | ORAL | Status: DC | PRN

## 2019-03-26 MED ORDER — ARIPIPRAZOLE 15 MG PO TABS
15.0000 mg | ORAL_TABLET | Freq: Every day | ORAL | Status: DC
  Administered 2019-03-26: 15 mg via ORAL
  Filled 2019-03-26: qty 1

## 2019-03-26 MED ORDER — POTASSIUM CHLORIDE CRYS ER 20 MEQ PO TBCR
20.0000 meq | EXTENDED_RELEASE_TABLET | Freq: Once | ORAL | Status: AC
  Administered 2019-03-26: 20 meq via ORAL
  Filled 2019-03-26: qty 1

## 2019-03-26 MED ORDER — NICOTINE 21 MG/24HR TD PT24
21.0000 mg | MEDICATED_PATCH | Freq: Every day | TRANSDERMAL | Status: DC
  Administered 2019-03-26: 08:00:00 21 mg via TRANSDERMAL
  Filled 2019-03-26: qty 1

## 2019-03-26 MED ORDER — ONDANSETRON HCL 4 MG PO TABS
4.0000 mg | ORAL_TABLET | Freq: Three times a day (TID) | ORAL | Status: DC | PRN
  Filled 2019-03-26: qty 1

## 2019-03-26 MED ORDER — ACETAMINOPHEN 325 MG PO TABS
650.0000 mg | ORAL_TABLET | Freq: Four times a day (QID) | ORAL | Status: DC | PRN
  Administered 2019-03-26: 08:00:00 650 mg via ORAL
  Filled 2019-03-26: qty 2

## 2019-03-26 MED ORDER — TRAZODONE HCL 50 MG PO TABS: 50.0000 mg | ORAL_TABLET | Freq: Every evening | ORAL | Status: DC | PRN

## 2019-03-26 MED ORDER — MAGNESIUM HYDROXIDE 400 MG/5ML PO SUSP
30.0000 mL | Freq: Every day | ORAL | Status: DC | PRN
  Administered 2019-03-26: 08:00:00 30 mL via ORAL

## 2019-03-26 NOTE — Discharge Summary (Addendum)
Physician Discharge Summary Note  Patient:  Betty Parrish is an 29 y.o., female MRN:  [id number] DOB:  September 21, 1990 Patient phone:  [phone] (home)  Patient address:   [place] [address],  Total Time spent with patient: 15 minutes  Date of Admission:  03/26/2019 Date of Discharge: 0/16/2021  Evaluation: Patient was seen and evaluated by attending psychiatrist Lache Dagher and nurse practitioners.  Denying suicidal or homicidal ideations.  Denies auditory or visual hallucinations.  States she has been off her medications for the past 2 months.  Reports she had an anxiety attack and attempted to take her Abilify yesterday however the medication did not work.  Reports she is followed by a [place] [facility] for medication management.  Reports using marijuana.  Patient has requested to be discharged as she states she cares for 69 year old daughter.  Was currently under the care of her child's father.  Patient reports feeling better today.  Staff to continue to monitor for safety.  Support,encouragement and  reassurance was provided.  Per admission assessment note: History of Present Illness:  TTS Assessment: Betty Parrish is an 29 y.o. female who presents to the ED voluntarily. Pt reports she has been experiencing panic attacks and it began today after she was getting her nails done. Pt states the person was not doing her nails the way she wanted them done. Pt then stated she is upset because she has herpes. TTS asked the pt if she just learned this and she states no that she has known since she was 29 years old. Pt is disorganized and states that she lives alone. TTS then questioned if the pt has a daughter as reported in chart review and pt states "oh yes, my daughter lives with me." Pt states she began to cry and scream during her panic attack prompting her to come to the ED. Pt reports to ED staff that she experienced AVH with command that tell her to harm herself. TTS asked the pt  to provide additional details regarding the AVH and she denies states she does not experience AVH although she reported it earlier to several ED staff. Pt provides consent for TTS to speak with her boyfriend, Almon Hercules at [phone]. Boyfriend reports the pt has been experiencing blackout episodes for the past week that have been getting worse in which she becomes erratic. Boyfriend reports it may be due to the medication that she is taking that's causing her to behavior to change over the past week.  Principal Problem: Schizoaffective disorder, bipolar type [facility]) Discharge Diagnoses: Principal Problem:   Schizoaffective disorder, bipolar type ([place]) Active Problems:   Schizoaffective disorder ([place])   Past Psychiatric History:   Past Medical History:  Past Medical History:  Diagnosis Date   ADHD (attention deficit hyperactivity disorder)    Anxiety    Heart murmur    Herpes    Schizo-affective psychosis ([place])    Sickle cell trait ([place])    History reviewed. No pertinent surgical history. Family History:  Family History  Problem Relation Age of Onset   Sickle cell anemia Mother    Family Psychiatric  History:  Social History:  Social History   Substance and Sexual Activity  Alcohol Use No     Social History   Substance and Sexual Activity  Drug Use Yes   Types: Marijuana, Cocaine   Comment: last used marijuana yesterday, denies cocaine use    Social History   Socioeconomic History   Marital status: Significant  Other    Spouse name: Not on file   Number of children: Not on file   Years of education: Not on file   Highest education level: Not on file  Occupational History   Not on file  Tobacco Use   Smoking status: Current Every Day Smoker    Packs/day: 1.00    Types: Cigarettes   Smokeless tobacco: Never Used  Substance and Sexual Activity   Alcohol use: No   Drug use: Yes    Types: Marijuana, Cocaine    Comment: last used marijuana yesterday, denies  cocaine use   Sexual activity: Yes    Birth control/protection: I.U.D.  Other Topics Concern   Not on file  Social History Narrative   Not on file   Social Determinants of Health   Financial Resource Strain:    Difficulty of Paying Living Expenses: Not on file  Food Insecurity:    Worried About Running Out of Food in the Last Year: Not on file   Ran Out of Food in the Last Year: Not on file  Transportation Needs:    Lack of Transportation (Medical): Not on file   Lack of Transportation (Non-Medical): Not on file  Physical Activity:    Days of Exercise per Week: Not on file   Minutes of Exercise per Session: Not on file  Stress:    Feeling of Stress : Not on file  Social Connections:    Frequency of Communication with Friends and Family: Not on file   Frequency of Social Gatherings with Friends and Family: Not on file   Attends Religious Services: Not on file   Active Member of Clubs or Organizations: Not on file   Attends Banker Meetings: Not on file   Marital Status: Not on file    Hospital Course:  Betty Parrish was admitted for Schizoaffective disorder, bipolar type (HCC) and crisis management.  Pt was treated discharged with the medications listed below under Medication List.  Medical problems were identified and treated as needed.  Home medications were restarted as appropriate.  Improvement was monitored by observation and Betty Parrish 's daily report of symptom reduction.  Emotional and mental status was monitored by daily self-inventory reports completed by Betty Parrish and clinical staff.         Betty Parrish was evaluated by the treatment team for stability and plans for continued recovery upon discharge. Betty Parrish 's motivation was an integral factor for scheduling further treatment. Employment, transportation, bed availability, health status, family support, and any pending legal issues were also considered during hospital stay. Pt was offered further treatment  options upon discharge including but not limited to Residential, Intensive Outpatient, and Outpatient treatment.  Betty Parrish will follow up with the services as listed below under Follow Up Information.     Upon completion of this admission the patient was both mentally and medically stable for discharge denying suicidal/homicidal ideation, auditory/visual/tactile hallucinations, delusional thoughts and paranoia.    Betty Parrish  demonstrated improvement without reported or observed adverse effects to the point of stability appropriate for outpatient management. Pertinent labs include: CMP: Low Potassium 2.9 for which outpatient follow-up is necessary for lab recheck as mentioned below. Reviewed CBC, CMP, BAL, and UDS+ THC; all unremarkable aside from noted exceptions.   Physical Findings: AIMS: Facial and Oral Movements Muscles of Facial Expression: None, normal Lips and Perioral Area: None, normal Jaw: None, normal Tongue: None, normal,Extremity Movements Upper (arms, wrists, hands, fingers): None, normal Lower (legs,  knees, ankles, toes): None, normal, Trunk Movements Neck, shoulders, hips: None, normal, Overall Severity Severity of abnormal movements (highest score from questions above): None, normal Incapacitation due to abnormal movements: None, normal Patient's awareness of abnormal movements (rate only patient's report): No Awareness, Dental Status Current problems with teeth and/or dentures?: No Does patient usually wear dentures?: No  CIWA:    COWS:     Musculoskeletal: Strength & Muscle Tone: within normal limits Gait & Station: normal Patient leans: N/A  Psychiatric Specialty Exam: See SRA by MD  Physical Exam  Nursing note and vitals reviewed.   Review of Systems  Psychiatric/Behavioral: The patient is nervous/anxious.     Blood pressure 120/87, pulse 86, temperature 98.7 F (37.1 C), temperature source Oral, resp. rate 18, height 5\' 8"  (1.727 m), weight 98 kg, SpO2 99  %.Body mass index is 32.84 kg/m.  Has this patient used any form of tobacco in the last 30 days? (Cigarettes, Smokeless Tobacco, Cigars, and/or Pipes) Yes, Yes, A prescription for an FDA-approved tobacco cessation medication was offered at discharge and the patient refused  Blood Alcohol level:  Lab Results  Component Value Date   ETH <10 03/25/2019   ETH <10 03/05/2018    Metabolic Disorder Labs:  No results found for: HGBA1C, MPG No results found for: PROLACTIN No results found for: CHOL, TRIG, HDL, CHOLHDL, VLDL, LDLCALC  See Psychiatric Specialty Exam and Suicide Risk Assessment completed by Attending Physician prior to discharge.  Discharge destination:  Home  Is patient on multiple antipsychotic therapies at discharge:  No   Has Patient had three or more failed trials of antipsychotic monotherapy by history:  No  Recommended Plan for Multiple Antipsychotic Therapies: NA  Discharge Instructions     Diet - low sodium heart healthy   Complete by: As directed    Discharge instructions   Complete by: As directed    Take all medications as prescribed. Keep all follow-up appointments as scheduled.  Do not consume alcohol or use illegal drugs while on prescription medications. Report any adverse effects from your medications to your primary care provider promptly.  In the event of recurrent symptoms or worsening symptoms, call 911, a crisis hotline, or go to the nearest emergency department for evaluation.   Increase activity slowly   Complete by: As directed       Allergies as of 03/26/2019       Reactions   Red Dye Hives   Haw[place]ian punch   Zyprexa [olanzapine] Other (See Comments)   Muscle pain and jerking         Medication List     STOP taking these medications    ARIPiprazole 10 MG tablet Commonly known as: ABILIFY   ARIPiprazole 15 MG tablet Commonly known as: ABILIFY   traZODone 50 MG tablet Commonly known as: DESYREL          Follow-up  recommendations:  Activity:  as tolerated Diet:  heart healthy  Comments:  Take all medications as prescribed. Keep all follow-up appointments as scheduled.  Do not consume alcohol or use illegal drugs while on prescription medications. Report any adverse effects from your medications to your primary care provider promptly.  In the event of recurrent symptoms or worsening symptoms, call 911, a crisis hotline, or go to the nearest emergency department for evaluation.   Signed: 03/28/2019, NP 03/26/2019, 11:10 AM Patient seen face-to-face for psychiatric evaluation, chart reviewed and case discussed with the physician extender and developed treatment plan. Reviewed  the information documented and agree with the treatment plan. Corena Pilgrim, MD

## 2019-03-26 NOTE — [facility] Suicide Risk Assessment (Cosign Needed)
Suicide Risk Assessment  Discharge Assessment   [facility] Discharge Suicide Risk Assessment   Principal Problem: Schizoaffective disorder, bipolar type [facility]) Discharge Diagnoses: Principal Problem:   Schizoaffective disorder, bipolar type (HCC) Active Problems:   Schizoaffective disorder (HCC)   Total Time spent with patient: 15 minutes  Musculoskeletal: Strength & Muscle Tone: within normal limits Gait & Station: normal Patient leans: N/A  Psychiatric Specialty Exam:   Blood pressure 120/87, pulse 86, temperature 98.7 F (37.1 C), temperature source Oral, resp. rate 18, height 5\' 8"  (1.727 m), weight 98 kg, SpO2 99 %.Body mass index is 32.84 kg/m.  General Appearance: Casual  Eye Contact::  Good  Speech:  Clear and Coherent409  Volume:  Normal  Mood:  Anxious and Depressed  Affect:  Congruent  Thought Process:  Coherent  Orientation:  Full (Time, Place, and Person)  Thought Content:  Logical  Suicidal Thoughts:  No  Homicidal Thoughts:  No  Memory:  Immediate;   Fair Recent;   Fair  Judgement:  Fair  Insight:  Fair  Psychomotor Activity:  Normal  Concentration:  Fair  Recall:  [phone] of Knowledge:Fair  Language: Fair  Akathisia:  No  Handed:  Right  AIMS (if indicated):     Assets:  Communication Skills Desire for Improvement Physical Health Resilience Social Support  Sleep:     Cognition: WNL  ADL's:  Intact   Mental Status Per Nursing Assessment::   On Admission:  Self-harm thoughts  Demographic Factors:  Living alone  Loss Factors: NA  Historical Factors: Family history of mental illness or substance abuse  Risk Reduction Factors:   Responsible for children under 59 years of age  Continued Clinical Symptoms:  Panic Attacks  Cognitive Features That Contribute To Risk:  Closed-mindedness    Suicide Risk:  Minimal: No identifiable suicidal ideation.  Patients presenting with no risk factors but with morbid ruminations; may be classified as  minimal risk based on the severity of the depressive symptoms    Plan Of Care/Follow-up recommendations:  Activity:  as tolerated Diet:  heart healthy  15, NP 03/26/2019, 11:07 AM

## 2019-03-26 NOTE — ED Notes (Signed)
Pt transport to [facility]
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
## 2019-03-26 NOTE — Progress Notes (Signed)

## 2019-03-26 NOTE — Progress Notes (Signed)
Patient ID: Betty Parrish, female   DOB: [birth year], 29 y.o.   MRN: [id number] Pt reports she has been experiencing panic attacks recently. Pt reports it began today after she was getting her nails done. Pt states the person was not doing her nails the way she wanted them done. Pt reports she called EMS to take her to a safe place because she was having thoughts of hurting herself. Pt denies hallucination with Clinical research associate but told NP she sees police officer pointing a gun to her head. Pt reports her goal is to get an ACT team and get her medication adjusted. Pt reports hx of ADHD and sickle cell trait.

## 2019-03-26 NOTE — ED Notes (Signed)
Pt  States unable to void fluids encourged

## 2019-03-26 NOTE — Progress Notes (Addendum)
D: Pt A & O X 3. Presents animated with bright affect, logical speech and is pleasant on interactions. Pt denies SI, HI, AVH and pain at this time. D/C home as ordered and was excited "Yeah, I get to leave today".  Escorted to lobby by Clinical research associate at time of d/c. A: D/C instructions reviewed with pt including upcoming follow up appointment with her PCP; compliance encouraged. All belongings from locker 12 returned to pt at time of departure. Instructions provided to the bus stop. Scheduled and PRN  medications administered as ordered with verbal education and effects monitored. Safety checks maintained without incident till time of d/c.  R: Pt receptive to care. Compliant with medications when offered this morning. Denies adverse drug reactions when assessed. Verbalized understanding related to d/c instructions. Signed belonging sheet in agreement with items received from locker. Ambulatory with a steady gait. Appears to be in no physical distress at time of departure.

## 2019-05-25 ENCOUNTER — Emergency Department (HOSPITAL_COMMUNITY)
Admission: EM | Admit: 2019-05-25 | Discharge: 2019-05-26 | Disposition: A | Discharge status: Home / Self Care | Payer: Medicaid Other | Attending: Emergency Medicine | Admitting: Emergency Medicine

## 2019-05-25 ENCOUNTER — Encounter (HOSPITAL_COMMUNITY): Payer: Self-pay | Admitting: Emergency Medicine

## 2019-05-25 DIAGNOSIS — Z20822 Contact with and (suspected) exposure to covid-19: Secondary | ICD-10-CM | POA: Diagnosis not present

## 2019-05-25 DIAGNOSIS — F250 Schizoaffective disorder, bipolar type: Secondary | ICD-10-CM | POA: Insufficient documentation

## 2019-05-25 DIAGNOSIS — D573 Sickle-cell trait: Secondary | ICD-10-CM | POA: Diagnosis not present

## 2019-05-25 DIAGNOSIS — F17210 Nicotine dependence, cigarettes, uncomplicated: Secondary | ICD-10-CM | POA: Diagnosis not present

## 2019-05-25 DIAGNOSIS — R4586 Emotional lability: Secondary | ICD-10-CM

## 2019-05-25 DIAGNOSIS — R45851 Suicidal ideations: Secondary | ICD-10-CM | POA: Diagnosis not present

## 2019-05-25 DIAGNOSIS — F309 Manic episode, unspecified: Secondary | ICD-10-CM | POA: Diagnosis present

## 2019-05-25 LAB — RAPID URINE DRUG SCREEN, HOSP PERFORMED
Amphetamines: NOT DETECTED
Barbiturates: NOT DETECTED
Benzodiazepines: NOT DETECTED
Cocaine: NOT DETECTED
Opiates: NOT DETECTED
Tetrahydrocannabinol: POSITIVE — AB

## 2019-05-25 LAB — CBC
HCT: 41.4 % (ref 36.0–46.0)
Hemoglobin: 13.9 g/dL (ref 12.0–15.0)
MCH: 31.5 pg (ref 26.0–34.0)
MCHC: 33.6 g/dL (ref 30.0–36.0)
MCV: 93.9 fL (ref 80.0–100.0)
Platelets: 264 10*3/uL (ref 150–400)
RBC: 4.41 MIL/uL (ref 3.87–5.11)
RDW: 12.7 % (ref 11.5–15.5)
WBC: 6.9 10*3/uL (ref 4.0–10.5)
nRBC: 0 % (ref 0.0–0.2)

## 2019-05-25 LAB — ETHANOL: Alcohol, Ethyl (B): <10 mg/dL (ref <10)

## 2019-05-25 LAB — SALICYLATE LEVEL: Salicylate Lvl: <7 mg/dL — ABNORMAL LOW (ref 7.0–30.0)

## 2019-05-25 LAB — COMPREHENSIVE METABOLIC PANEL
ALT: 24 U/L (ref 0–44)
AST: 25 U/L (ref 15–41)
Albumin: 4.4 g/dL (ref 3.5–5.0)
Alkaline Phosphatase: 63 U/L (ref 38–126)
Anion gap: 10 (ref 5–15)
BUN: 8 mg/dL (ref 6–20)
CO2: 23 mmol/L (ref 22–32)
Calcium: 9.2 mg/dL (ref 8.9–10.3)
Chloride: 104 mmol/L (ref 98–111)
Creatinine, Ser: 0.74 mg/dL (ref 0.44–1.00)
GFR calc Af Amer: >60 mL/min (ref >60)
GFR calc non Af Amer: >60 mL/min (ref >60)
Glucose, Bld: 100 mg/dL — ABNORMAL HIGH (ref 70–99)
Potassium: 3.5 mmol/L (ref 3.5–5.1)
Sodium: 137 mmol/L (ref 135–145)
Total Bilirubin: 0.8 mg/dL (ref 0.3–1.2)
Total Protein: 7.7 g/dL (ref 6.5–8.1)

## 2019-05-25 LAB — ACETAMINOPHEN LEVEL: Acetaminophen (Tylenol), Serum: <10 ug/mL — ABNORMAL LOW (ref 10–30)

## 2019-05-25 LAB — RESPIRATORY PANEL BY RT PCR (FLU A&B, COVID)
Influenza A by PCR: NEGATIVE
Influenza B by PCR: NEGATIVE
SARS Coronavirus 2 by RT PCR: NEGATIVE

## 2019-05-25 LAB — I-STAT BETA HCG BLOOD, ED (MC, WL, AP ONLY): I-stat hCG, quantitative: <5 m[IU]/mL (ref <5)

## 2019-05-25 MED ORDER — ACETAMINOPHEN 325 MG PO TABS
650.0000 mg | ORAL_TABLET | Freq: Once | ORAL | Status: AC
  Administered 2019-05-25: 650 mg via ORAL
  Filled 2019-05-25: qty 2

## 2019-05-25 MED ORDER — ARIPIPRAZOLE 10 MG PO TABS
10.0000 mg | ORAL_TABLET | Freq: Every day | ORAL | Status: DC
  Administered 2019-05-25: 10 mg via ORAL
  Filled 2019-05-25: qty 1

## 2019-05-25 MED ORDER — ARIPIPRAZOLE ER 400 MG IM SRER
400.0000 mg | INTRAMUSCULAR | Status: DC
  Administered 2019-05-25: 400 mg via INTRAMUSCULAR
  Filled 2019-05-25: qty 2

## 2019-05-25 NOTE — ED Provider Notes (Signed)
[facility]-EMERGENCY DEPT Provider Note   CSN: [id number] Arrival date & time: 05/25/19  1159     History Chief Complaint  Patient presents with  . IVC    Betty Parrish is a 29 y.o. female.  HPI    Patient presents under involuntary commitment via police officers.  History is obtained by police officers, and the patient herself. The patient had involuntary commitment executed by her mother, with concern for medication noncompliance, labile behavior, possible danger to self. The patient herself denies any complaints, including physical or psychiatric.  She states that she has not been taking her medication because she ran out.  She denies any suicidal thoughts. EMS providers report the patient had reportedly labile interactivity, but was not violent, aggressive towards them in transport.  Past Medical History:  Diagnosis Date  . ADHD (attention deficit hyperactivity disorder)   . Anxiety   . Heart murmur   . Herpes   . Schizo-affective psychosis (HCC)   . Sickle cell trait [facility])     Patient Active Problem List   Diagnosis Date Noted  . Schizoaffective disorder (HCC) 03/26/2019  . Cannabis abuse   . Bipolar disorder, manic phase (HCC) 09/30/2014  . Schizoaffective disorder, bipolar type (HCC) 09/29/2014  . Mania (HCC) 12/18/2012    History reviewed. No pertinent surgical history.   OB History   No obstetric history on file.     Family History  Problem Relation Age of Onset  . Sickle cell anemia Mother     Social History   Tobacco Use  . Smoking status: Current Every Day Smoker    Packs/day: 1.00    Types: Cigarettes  . Smokeless tobacco: Never Used  Substance Use Topics  . Alcohol use: No  . Drug use: Yes    Types: Marijuana, Cocaine    Comment: last used marijuana yesterday, denies cocaine use    Home Medications Prior to Admission medications   Not on File    Allergies    Red dye and Zyprexa [olanzapine]  Review of Systems    Review of Systems  Constitutional:       Per HPI, otherwise negative  HENT:       Per HPI, otherwise negative  Respiratory:       Per HPI, otherwise negative  Cardiovascular:       Per HPI, otherwise negative  Gastrointestinal: Negative for vomiting.  Endocrine:       Negative aside from HPI  Genitourinary:       Neg aside from HPI   Musculoskeletal:       Per HPI, otherwise negative  Skin: Negative.   Neurological: Negative for syncope.  Psychiatric/Behavioral:       Per HPI  Some question of the validity of the review of systems secondary to the patient's known psychiatric disease. Physical Exam Updated Vital Signs BP 139/86   Pulse 88   Temp 98.5 F (36.9 C) (Oral)   Resp 20   SpO2 97%   Physical Exam Vitals and nursing note reviewed.  Constitutional:      General: She is not in acute distress.    Appearance: She is well-developed.  HENT:     Head: Normocephalic and atraumatic.  Eyes:     Conjunctiva/sclera: Conjunctivae normal.  Pulmonary:     Effort: Pulmonary effort is normal. No respiratory distress.     Breath sounds: No stridor.  Abdominal:     General: There is no distension.  Skin:  General: Skin is warm and dry.  Neurological:     Mental Status: She is alert and oriented to person, place, and time.     Cranial Nerves: No cranial nerve deficit.  Psychiatric:        Mood and Affect: Mood is anxious.        Speech: Speech is rapid and pressured.     ED Results / Procedures / Treatments   Labs (all labs ordered are listed, but only abnormal results are displayed) Labs Reviewed  COMPREHENSIVE METABOLIC PANEL - Abnormal; Notable for the following components:      Result Value   Glucose, Bld 100 (*)    All other components within normal limits  SALICYLATE LEVEL - Abnormal; Notable for the following components:   Salicylate Lvl <8.4 (*)    All other components within normal limits  ACETAMINOPHEN LEVEL - Abnormal; Notable for the following  components:   Acetaminophen (Tylenol), Serum <10 (*)    All other components within normal limits  RESPIRATORY PANEL BY RT PCR (FLU A&B, COVID)  ETHANOL  CBC  RAPID URINE DRUG SCREEN, HOSP PERFORMED  I-STAT BETA HCG BLOOD, ED ([facility], [facility], [facility] ONLY)    EKG None  Radiology No results found.  Procedures Procedures (including critical care time)  Medications Ordered in ED Medications - No data to display  ED Course  I have reviewed the triage vital signs and the nursing notes.  Pertinent labs & imaging results that were available during my care of the patient were reviewed by me and considered in my medical decision making (see chart for details).  Female with bipolar disorder, schizophrenia presents under involuntary commitment via police officers due to concern of labile behavior, medication noncompliance.  The patient denies complaints, but has minimal insight into her presentation, or recognition of rationale for involuntary commitment.  She denies any physical complaints, is hemodynamically unremarkable, is awake, alert, afebrile.  Patient medically cleared for psychiatric consultation. Final Clinical Impression(s) / ED Diagnoses Final diagnoses:  Labile mood     Carmin Muskrat, MD 05/25/19 1323
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
## 2019-05-25 NOTE — ED Notes (Signed)
Pt to room. Pt oriented to unit. Pt calm. Pt given food and blanket.  TTS assessed pt.

## 2019-05-25 NOTE — ED Notes (Signed)
Patient changed into burgundy scrubs and wanded by security. Three labeled belongings bags placed in cabinet by 5-8.
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
## 2019-05-26 DIAGNOSIS — F250 Schizoaffective disorder, bipolar type: Secondary | ICD-10-CM

## 2019-05-26 NOTE — Progress Notes (Signed)
05/26/2019  1200 Patient states she is ready for discharge. Patient states she has al the contact numbers she need to make her appointments.

## 2019-05-26 NOTE — BHH Suicide Risk Assessment (Cosign Needed)
Suicide Risk Assessment  Discharge Assessment   [facility] Discharge Suicide Risk Assessment   Principal Problem: Schizoaffective disorder, bipolar type [facility]) Discharge Diagnoses: Principal Problem:   Schizoaffective disorder, bipolar type (HCC) Active Problems:   Mania (HCC)   Total Time spent with patient: 30 minutes  Musculoskeletal: Strength & Muscle Tone: within normal limits Gait & Station: normal Patient leans: N/A  Psychiatric Specialty Exam:   Blood pressure 114/67, pulse (!) 49, temperature 98.6 F (37 C), temperature source Oral, resp. rate 19, SpO2 96 %.There is no height or weight on file to calculate BMI.  General Appearance: Casual and sitting upright in hospital bed and appropiately interacts during assessment  Eye Contact::  Good  Speech:  Clear and Coherent and Normal Rate409  Volume:  Normal  Mood:  Euthymic and slightly irritable as she relates the circumstances of HPI  Affect:  Congruent  Thought Process:  Coherent and Descriptions of Associations: Intact  Orientation:  Full (Time, Place, and Person)  Thought Content:  Logical  Suicidal Thoughts:  No  Homicidal Thoughts:  No  Memory:  Immediate;   Good Recent;   Good Remote;   Good  Judgement:  Good at the time of assessment but otherwise appears to be baseline in the setting of medication non-compliance.   Insight:  Good  Psychomotor Activity:  Normal  Concentration:  Good  Recall:  Good  Fund of Knowledge:Good  Language: Good  Akathisia:  Negative  Handed:  Right  AIMS (if indicated):     Assets:  Architect Housing  Sleep:     Cognition: WNL  ADL's:  Intact   Mental Status Per Nursing Assessment::   On Admission:     Demographic Factors:  Living alone  Loss Factors: NA  Historical Factors: NA  Risk Reduction Factors:   Sense of responsibility to family and states she has a 29 year old daughther that she loves and shares joint custody with her  father.  Continued Clinical Symptoms:  More than one psychiatric diagnosis  Cognitive Features That Contribute To Risk:  None    Suicide Risk:  Mild:  Suicidal ideation of limited frequency, intensity, duration, and specificity.  There are no identifiable plans, no associated intent, mild dysphoria and related symptoms, good self-control (both objective and subjective assessment), few other risk factors, and identifiable protective factors, including available and accessible social support.   Plan Of Care/Follow-up recommendations:    Elyzabeth Goatley was treated at the [facility] ED for Schizoaffective disorder, bipolar type [facility]) and crisis management. She was treated with the following medications, Aripiprazole 10mg  po and Aripiprazole ER 400mg  injection.  Mialani Reicks was discharged with current medication and was instructed on how to take medications as prescribed; (details listed below under Medication List).  Medical problems were identified and treated as needed.  Home medications were restarted as appropriate.  Improvement was monitored by observation and daily report of symptom reduction.        SW consultation was ordered to follow-up with ACTT for resumed outpatient counseling and medication management.   At discharge, Adryan Druckenmiller was both mentally and medically stable for discharge denying suicidal/homicidal ideation, auditory/visual/tactile hallucinations, delusional thoughts and paranoia.     Recommend Discharge home.      Spoke with EDP and notified of above recommendations.   Glennis Brink, NP 05/26/2019, 10:29 AM

## 2019-05-26 NOTE — ED Provider Notes (Signed)
Emergency Medicine Observation Re-evaluation Note  Betty Parrish is a 29 y.o. female, seen on rounds today.  Pt initially presented to the ED for complaints of IVC Currently, the patient is awaiting placement  Physical Exam  BP 114/67 (BP Location: Right Arm)   Pulse (!) 49   Temp 98.6 F (37 C) (Oral)   Resp 19   SpO2 96%  Physical Exam Pt alert in nad ED Course / MDM  EKG:    I have reviewed the labs performed to date as well as medications administered while in observation.  Plan  Current plan is for placement Patient is under full IVC at this time.   Bethann Berkshire, MD 05/26/19 [phone]

## 2019-05-26 NOTE — Discharge Instructions (Addendum)
For your behavioral health needs, you are advised to follow up with an outpatient provider.  You may be eligible for ACT Team services, which would include frequent visits with your provider, as well as in-home services.  You have been referred to [facility] for consideration for their ACT Team.  You are scheduled for an intake appointment on Thursday, June 16, 2019 at 1:00 pm.  If you have any questions or if you will be unable to keep this appointment, call them at the number listed below at your earliest opportunity:       [facility]      [address]      [place], [place] [address]      [phone]

## 2019-05-26 NOTE — BH Assessment (Addendum)
BHH Assessment Progress Note  Per Ophelia Shoulder, NP, this pt does not require psychiatric hospitalization at this time.  Pt presents under IVC initiated by pt's mother, which has been rescinded by Landry Mellow, MD.  Pt is to be discharged from [facility].  EPIC record indicates that pt's mother, [name], is pt's legal guardian, but there is no letter of guardianship found in pt's record.  Pt refuses to sign Consent to Release Information to the mother.  At 11:25 this writer called Isabell Jarvis, ascertaining that pt may have signed a healthcare power of attorney to the mother in the past, but that the mother is not legal guardian.  FYI has been modified accordingly.  Pt would benefit from receiving ACT Team services, which is consistent with her wishes.  Arlys John Degraphenreid has referred pt to the [facility] ACT Team.  Their contact information has been included in pt's discharge instructions with recommendation that she contact them.  If this Clinical research associate receives an intake appointment from them before pt departs, it will be added to discharge instructions.  Pt's nurse has been notified.  Doylene Canning, MA Triage Specialist [phone]  Addendum:  Pt is scheduled for intake appointment on Thursday, 06/16/2019 at 13:00.  This has been included in pt's AVS.  Pt's nurse, Addison Naegeli, has been notified.  Doylene Canning, [place] Behavioral Health Coordinator [phone]

## 2019-05-26 NOTE — Progress Notes (Signed)
TOC consult recevied to assist patient in reconnecting with [facility] MH services. CSW made contact with Janice Coffin with TTS who reports he is currently working on this. CSW signing off. Please consult again for any further needs.   Geralyn Corwin, LCSW Transitions of Care Department [facility] ED [phone]

## 2019-06-08 ENCOUNTER — Ambulatory Visit (HOSPITAL_COMMUNITY)
Admission: AD | Admit: 2019-06-08 | Discharge: 2019-06-08 | Disposition: A | Discharge status: Home / Self Care | Payer: Medicaid Other | Attending: Psychiatry | Admitting: Psychiatry

## 2019-06-08 DIAGNOSIS — Z1330 Encounter for screening examination for mental health and behavioral disorders, unspecified: Secondary | ICD-10-CM | POA: Insufficient documentation
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
## 2019-08-19 ENCOUNTER — Emergency Department (HOSPITAL_COMMUNITY)
Admission: EM | Admit: 2019-08-19 | Discharge: 2019-08-19 | Disposition: A | Discharge status: Home / Self Care | Payer: Medicaid Other | Attending: Emergency Medicine | Admitting: Emergency Medicine

## 2019-08-19 ENCOUNTER — Other Ambulatory Visit: Payer: Self-pay

## 2019-08-19 ENCOUNTER — Encounter (HOSPITAL_COMMUNITY): Payer: Self-pay

## 2019-08-19 DIAGNOSIS — K029 Dental caries, unspecified: Secondary | ICD-10-CM

## 2019-08-19 DIAGNOSIS — K047 Periapical abscess without sinus: Secondary | ICD-10-CM

## 2019-08-19 DIAGNOSIS — R220 Localized swelling, mass and lump, head: Secondary | ICD-10-CM | POA: Insufficient documentation

## 2019-08-19 DIAGNOSIS — F17210 Nicotine dependence, cigarettes, uncomplicated: Secondary | ICD-10-CM | POA: Insufficient documentation

## 2019-08-19 DIAGNOSIS — Z91018 Allergy to other foods: Secondary | ICD-10-CM | POA: Diagnosis not present

## 2019-08-19 DIAGNOSIS — Z79899 Other long term (current) drug therapy: Secondary | ICD-10-CM | POA: Insufficient documentation

## 2019-08-19 DIAGNOSIS — K0889 Other specified disorders of teeth and supporting structures: Secondary | ICD-10-CM

## 2019-08-19 MED ORDER — AMOXICILLIN 500 MG PO CAPS
1000.0000 mg | ORAL_CAPSULE | Freq: Once | ORAL | Status: AC
  Administered 2019-08-19: 1000 mg via ORAL
  Filled 2019-08-19: qty 2

## 2019-08-19 MED ORDER — IBUPROFEN 600 MG PO TABS: 600.0000 mg | ORAL_TABLET | Freq: Three times a day (TID) | ORAL | 0 refills | Status: DC | PRN

## 2019-08-19 MED ORDER — AMOXICILLIN 500 MG PO CAPS: 500.0000 mg | ORAL_CAPSULE | Freq: Three times a day (TID) | ORAL | 0 refills | Status: DC

## 2019-08-19 MED ORDER — TRAMADOL HCL 50 MG PO TABS
50.0000 mg | ORAL_TABLET | Freq: Once | ORAL | Status: AC
  Administered 2019-08-19: 50 mg via ORAL
  Filled 2019-08-19: qty 1

## 2019-08-19 NOTE — ED Provider Notes (Addendum)
[facility]-EMERGENCY DEPT Provider Note   CSN: [id number] Arrival date & time: 08/19/19  1941     History Chief Complaint  Patient presents with  . Dental Pain    Betty Parrish is a 29 y.o. female.  Patient c/o right lower dental pain for the past couple months, worse in past week. Symptoms gradual onset, moderate, persistent, worse in past few days, worse w eating. States has local dentist but has not seen. No recent antibiotic use. Tried acetaminophen earlier without relief. No sore throat, no trouble breathing or swallowing. No neck pain or swelling. No fever or chills, otherwise does not feel sick or ill. Normal appetite. No recent trauma.   The history is provided by the patient.  Dental Pain Associated symptoms: no drooling, no fever, no headaches and no neck pain        Past Medical History:  Diagnosis Date  . ADHD (attention deficit hyperactivity disorder)   . Anxiety   . Heart murmur   . Herpes   . Schizo-affective psychosis (HCC)   . Sickle cell trait [facility])     Patient Active Problem List   Diagnosis Date Noted  . Schizoaffective disorder (HCC) 03/26/2019  . Cannabis abuse   . Bipolar disorder, manic phase (HCC) 09/30/2014  . Schizoaffective disorder, bipolar type (HCC) 09/29/2014  . Mania (HCC) 12/18/2012    History reviewed. No pertinent surgical history.   OB History   No obstetric history on file.     Family History  Problem Relation Age of Onset  . Sickle cell anemia Mother     Social History   Tobacco Use  . Smoking status: Current Every Day Smoker    Packs/day: 1.00    Types: Cigarettes  . Smokeless tobacco: Never Used  Vaping Use  . Vaping Use: Never used  Substance Use Topics  . Alcohol use: No  . Drug use: Yes    Types: Marijuana, Cocaine    Comment: last used marijuana yesterday, denies cocaine use    Home Medications Prior to Admission medications   Medication Sig Start Date End Date Taking? Authorizing  Provider  ARIPiprazole (ABILIFY) 15 MG tablet Take 15 mg by mouth daily. 04/15/19   [provider]  traZODone (DESYREL) 50 MG tablet Take 50 mg by mouth at bedtime. 04/15/19   [provider]    Allergies    Red dye and Zyprexa [olanzapine]  Review of Systems   Review of Systems  Constitutional: Negative for chills and fever.  HENT: Positive for dental problem. Negative for drooling, sore throat and trouble swallowing.   Respiratory: Negative for shortness of breath.   Musculoskeletal: Negative for neck pain and neck stiffness.  Skin: Negative for rash.  Neurological: Negative for headaches.    Physical Exam Updated Vital Signs BP 130/79   Pulse 100   Temp 99.1 F (37.3 C) (Oral)   Resp 16   SpO2 98%   Physical Exam Vitals and nursing note reviewed.  Constitutional:      Appearance: Normal appearance. She is well-developed.  HENT:     Head: Atraumatic.     Nose: Nose normal.     Mouth/Throat:     Mouth: Mucous membranes are moist.     Pharynx: Oropharynx is clear. No oropharyngeal exudate or posterior oropharyngeal erythema.     Comments: No pharyngeal swelling. No swelling to tongue, floor of mouth, or neck. Mild swelling right mandible. Right lower molar dental decay, with associated gum swelling/tenderness. No  trismus.  Eyes:     General: No scleral icterus.    Conjunctiva/sclera: Conjunctivae normal.  Neck:     Trachea: No tracheal deviation.     Comments: Trachea midline. No neck pain, swelling, or tenderness.  Cardiovascular:     Rate and Rhythm: Normal rate.     Pulses: Normal pulses.  Pulmonary:     Effort: Pulmonary effort is normal. No respiratory distress.     Breath sounds: No stridor.  Genitourinary:    Comments: No cva tenderness.  Musculoskeletal:        General: No swelling.     Cervical back: Normal range of motion and neck supple. No rigidity. No muscular tenderness.  Lymphadenopathy:     Cervical: No cervical adenopathy.    Skin:    General: Skin is warm and dry.     Findings: No rash.  Neurological:     Mental Status: She is alert.     Comments: Alert, speech normal. Steady gait.   Psychiatric:        Mood and Affect: Mood normal.     ED Results / Procedures / Treatments   Labs (all labs ordered are listed, but only abnormal results are displayed) Labs Reviewed - No data to display  EKG None  Radiology No results found.  Procedures Procedures (including critical care time)  Medications Ordered in ED Medications  traMADol (ULTRAM) tablet 50 mg (has no administration in time range)  amoxicillin (AMOXIL) capsule 1,000 mg (has no administration in time range)    ED Course  I have reviewed the triage vital signs and the nursing notes.  Pertinent labs & imaging results that were available during my care of the patient were reviewed by me and considered in my medical decision making (see chart for details).    MDM Rules/Calculators/A&P                          Confirmed no antibiotic/pain med allergies w pt. Patient has ride, does not have to drive. Ultram po. Po fluids. Amoxicillin po.   Discussed need for dental f/u Monday.  Reviewed nursing notes and prior charts for additional history.   Rx provided.   Return precautions provided.    Final Clinical Impression(s) / ED Diagnoses Final diagnoses:  None    Rx / DC Orders ED Discharge Orders    None          Lajean Saver, MD 08/19/19 2030

## 2019-08-19 NOTE — Discharge Instructions (Signed)
It was our pleasure to provide your ER care today - we hope that you feel better.  Follow up with dentist Monday - call office Monday AM to arrange appointment.   Take antibiotic as prescribed. Take acetaminophen and/or ibuprofen as need for pain.   Warm compresses to sore area.   Return to ER if worse, severe swelling, spreading redness, high fevers, trouble breathing or swallowing, or other concern.

## 2019-08-19 NOTE — ED Triage Notes (Signed)
Pt reports R lower dental pain that has worsened over the last few months. Swelling noted. Denies fevers or bleeding/discharge.

## 2019-12-26 ENCOUNTER — Other Ambulatory Visit: Payer: Self-pay

## 2019-12-26 ENCOUNTER — Encounter (HOSPITAL_COMMUNITY): Payer: Self-pay

## 2019-12-26 ENCOUNTER — Ambulatory Visit (HOSPITAL_COMMUNITY)
Admission: EM | Admit: 2019-12-26 | Discharge: 2019-12-27 | Disposition: A | Discharge status: Home / Self Care | Payer: Medicaid Other | Attending: Nurse Practitioner | Admitting: Nurse Practitioner

## 2019-12-26 DIAGNOSIS — F250 Schizoaffective disorder, bipolar type: Secondary | ICD-10-CM | POA: Insufficient documentation

## 2019-12-26 DIAGNOSIS — F909 Attention-deficit hyperactivity disorder, unspecified type: Secondary | ICD-10-CM | POA: Insufficient documentation

## 2019-12-26 DIAGNOSIS — Z20822 Contact with and (suspected) exposure to covid-19: Secondary | ICD-10-CM | POA: Insufficient documentation

## 2019-12-26 DIAGNOSIS — F419 Anxiety disorder, unspecified: Secondary | ICD-10-CM | POA: Insufficient documentation

## 2019-12-26 DIAGNOSIS — F17210 Nicotine dependence, cigarettes, uncomplicated: Secondary | ICD-10-CM | POA: Insufficient documentation

## 2019-12-26 LAB — POCT URINE DRUG SCREEN - MANUAL ENTRY (I-SCREEN)
POC Amphetamine UR: NOT DETECTED
POC Buprenorphine (BUP): NOT DETECTED
POC Cocaine UR: NOT DETECTED
POC Marijuana UR: POSITIVE — AB
POC Methadone UR: NOT DETECTED
POC Methamphetamine UR: NOT DETECTED
POC Morphine: NOT DETECTED
POC Oxazepam (BZO): NOT DETECTED
POC Oxycodone UR: NOT DETECTED
POC Secobarbital (BAR): NOT DETECTED

## 2019-12-26 LAB — POC SARS CORONAVIRUS 2 AG -  ED: SARS Coronavirus 2 Ag: NEGATIVE

## 2019-12-26 LAB — POCT PREGNANCY, URINE: Preg Test, Ur: NEGATIVE

## 2019-12-26 MED ORDER — ALUM & MAG HYDROXIDE-SIMETH 200-200-20 MG/5ML PO SUSP: 30.0000 mL | ORAL | Status: DC | PRN

## 2019-12-26 MED ORDER — MAGNESIUM HYDROXIDE 400 MG/5ML PO SUSP: 30.0000 mL | Freq: Every day | ORAL | Status: DC | PRN

## 2019-12-26 MED ORDER — ACETAMINOPHEN 325 MG PO TABS: 650.0000 mg | ORAL_TABLET | Freq: Four times a day (QID) | ORAL | Status: DC | PRN

## 2019-12-26 NOTE — ED Notes (Signed)
Patient has belongings in locker 1 including bb gun

## 2019-12-26 NOTE — ED Provider Notes (Signed)
Behavioral Health Admission H&P [facility] & OBS)  Date: 12/27/19 Patient Name: Betty Parrish MRN: [id number] Chief Complaint:  Chief Complaint  Patient presents with  . Suicidal   Chief Complaint/Presenting Problem: Suicidal thoughts, AVH.  Diagnoses:  Final diagnoses:  Schizoaffective disorder, bipolar type (HCC)    HPI: Betty Parrish is a 29 y.o. female with a history of schizoaffective disorder who presents to [facility] voluntarily due to suicidal thoughts. Patient is drowsy. She arouses to voice, but only briefly remains alert and to be wakened multiple times during the evaluation. Her speech is garbled at times. She reports that she is having suicidal thoughts. When asked about a plan she made some unintelligible remarks. She denies homicidal thoughts. She reports auditory and visual hallucinations. When asked to describe the AVH she again made some unintelligible remarks.   Patient reports that the only medications that she takes are the ones that she brought with her to [facility]. Medication bottles include Abilify 15 mg daily and Trazodone. The prescription labels are difficult to read and the medications do not appear to be recently filled. At this time it is not clear if the patient has continued to take abilify.She has received Abilify maintnena in the past.  She has a history of noncompliance. It appears that she was once followed by [facility], but those services were discontinued. DC note from [facility] OBS unit on 03/26/2019 indicates that abilify was discontinued at discharge.  PHQ 2-9:     ED from 12/26/2019 in [facility] Admission (Discharged) from 03/26/2019 in [facility] OBSERV[facility] UNIT Admission (Discharged) from [facility]  C-SSRS RISK CATEGORY Low Risk Low Risk No Risk       Total Time spent with patient: 20 minutes  Musculoskeletal  Strength & Muscle Tone: within normal  limits Gait & Station: normal Patient leans: N/A  Psychiatric Specialty Exam  Presentation General Appearance: Casual;Disheveled  Eye Contact:Minimal  Speech:Garbled  Speech Volume:Decreased  Handedness:Right   Mood and Affect  Mood:Anxious;Depressed;Hopeless;Worthless  Affect:Congruent   Thought Process  Thought Processes:Coherent  Descriptions of Associations:Intact  Orientation:Full (Time, Place and Person)  Thought Content:No data recorded Hallucinations:Hallucinations: Auditory;Visual Description of Auditory Hallucinations: patient reports AVH, however, she makes unintelligble comments about AVH  Ideas of Reference:No data recorded Suicidal Thoughts:Suicidal Thoughts: Yes, Active (reports SI plan, but speech is unintelligible when asked about plan/intent)  Homicidal Thoughts:Homicidal Thoughts: No   Sensorium  Memory:Immediate Fair  Judgment:Impaired  Insight:Lacking   Executive Functions  Concentration:No data recorded Attention Span:Other (comment) (patient is drowsy)  Recall:Fair  Fund of Knowledge:No data recorded Language:Fair   Psychomotor Activity  Psychomotor Activity:Psychomotor Activity: Normal   Assets  Assets:No data recorded  Sleep  Sleep:Sleep: Poor (reports that she has not slept in several days)   Physical Exam Constitutional:      General: She is not in acute distress.    Appearance: She is not ill-appearing, toxic-appearing or diaphoretic.  HENT:     Head: Normocephalic.     Right Ear: External ear normal.     Left Ear: External ear normal.  Eyes:     Conjunctiva/sclera: Conjunctivae normal.     Pupils: Pupils are equal, round, and reactive to light.  Cardiovascular:     Rate and Rhythm: Normal rate.  Pulmonary:     Effort: Pulmonary effort is normal. No respiratory distress.  Musculoskeletal:        General: Normal range of motion.  Skin:  General: Skin is warm and dry.  Neurological:     Mental Status:  She is alert and oriented to person, place, and time.  Psychiatric:        Mood and Affect: Mood is anxious and depressed.        Thought Content: Thought content is not paranoid or delusional. Thought content includes suicidal ideation. Thought content does not include homicidal ideation.    Review of Systems  Constitutional: Negative for chills, diaphoresis, fever, malaise/fatigue and weight loss.  HENT: Negative for congestion.   Respiratory: Negative for cough and shortness of breath.   Cardiovascular: Negative for chest pain and palpitations.  Gastrointestinal: Negative for diarrhea, nausea and vomiting.  Neurological: Negative for dizziness and seizures.  Psychiatric/Behavioral: Positive for depression, hallucinations, substance abuse and suicidal ideas. Negative for memory loss. The patient is nervous/anxious and has insomnia.   All other systems reviewed and are negative.   Blood pressure 135/84, pulse 92, temperature (!) 97.5 F (36.4 C), resp. rate 18, SpO2 98 %. There is no height or weight on file to calculate BMI.  Past Psychiatric History: Schizoaffective Disorder  Is the patient at risk to self? Yes  Has the patient been a risk to self in the past 6 months? No .    Has the patient been a risk to self within the distant past? Yes   Is the patient a risk to others? No   Has the patient been a risk to others in the past 6 months? No   Has the patient been a risk to others within the distant past? No   Past Medical History:  Past Medical History:  Diagnosis Date  . ADHD (attention deficit hyperactivity disorder)   . Anxiety   . Heart murmur   . Herpes   . Schizo-affective psychosis (HCC)   . Sickle cell trait (HCC)    History reviewed. No pertinent surgical history.  Family History:  Family History  Problem Relation Age of Onset  . Sickle cell anemia Mother     Social History:  Social History   Socioeconomic History  . Marital status: Significant Other     Spouse name: Not on file  . Number of children: Not on file  . Years of education: Not on file  . Highest education level: Not on file  Occupational History  . Not on file  Tobacco Use  . Smoking status: Current Every Day Smoker    Packs/day: 1.00    Types: Cigarettes  . Smokeless tobacco: Never Used  Vaping Use  . Vaping Use: Never used  Substance and Sexual Activity  . Alcohol use: No  . Drug use: Yes    Types: Marijuana, Cocaine    Comment: last used marijuana yesterday, denies cocaine use  . Sexual activity: Yes    Birth control/protection: I.U.D.  Other Topics Concern  . Not on file  Social History Narrative  . Not on file   Social Determinants of Health   Financial Resource Strain:   . Difficulty of Paying Living Expenses: Not on file  Food Insecurity:   . Worried About Programme researcher, broadcasting/film/video in the Last Year: Not on file  . Ran Out of Food in the Last Year: Not on file  Transportation Needs:   . Lack of Transportation (Medical): Not on file  . Lack of Transportation (Non-Medical): Not on file  Physical Activity:   . Days of Exercise per Week: Not on file  . Minutes of Exercise per  Session: Not on file  Stress:   . Feeling of Stress : Not on file  Social Connections:   . Frequency of Communication with Friends and Family: Not on file  . Frequency of Social Gatherings with Friends and Family: Not on file  . Attends Religious Services: Not on file  . Active Member of Clubs or Organizations: Not on file  . Attends BankerClub or Organization Meetings: Not on file  . Marital Status: Not on file  Intimate Partner Violence:   . Fear of Current or Ex-Partner: Not on file  . Emotionally Abused: Not on file  . Physically Abused: Not on file  . Sexually Abused: Not on file    SDOH:  SDOH Screenings   Alcohol Screen: Low Risk   . Last Alcohol Screening Score (AUDIT): 0  Depression (PHQ2-9):   . PHQ-2 Score: Not on file  Financial Resource Strain:   . Difficulty of  Paying Living Expenses: Not on file  Food Insecurity:   . Worried About Programme researcher, broadcasting/film/videounning Out of Food in the Last Year: Not on file  . Ran Out of Food in the Last Year: Not on file  Housing:   . Last Housing Risk Score: Not on file  Physical Activity:   . Days of Exercise per Week: Not on file  . Minutes of Exercise per Session: Not on file  Social Connections:   . Frequency of Communication with Friends and Family: Not on file  . Frequency of Social Gatherings with Friends and Family: Not on file  . Attends Religious Services: Not on file  . Active Member of Clubs or Organizations: Not on file  . Attends BankerClub or Organization Meetings: Not on file  . Marital Status: Not on file  Stress:   . Feeling of Stress : Not on file  Tobacco Use: High Risk  . Smoking Tobacco Use: Current Every Day Smoker  . Smokeless Tobacco Use: Never Used  Transportation Needs:   . Freight forwarderLack of Transportation (Medical): Not on file  . Lack of Transportation (Non-Medical): Not on file    Last Labs:  Admission on 12/26/2019  Component Date Value Ref Range Status  . SARS Coronavirus 2 by RT PCR 12/26/2019 NEGATIVE  NEGATIVE Final   Comment: (NOTE) SARS-CoV-2 target nucleic acids are NOT DETECTED.  The SARS-CoV-2 RNA is generally detectable in upper respiratoy specimens during the acute phase of infection. The lowest concentration of SARS-CoV-2 viral copies this assay can detect is 131 copies/mL. A negative result does not preclude SARS-Cov-2 infection and should not be used as the sole basis for treatment or other patient management decisions. A negative result may occur with  improper specimen collection/handling, submission of specimen other than nasopharyngeal swab, presence of viral mutation(s) within the areas targeted by this assay, and inadequate number of viral copies (<131 copies/mL). A negative result must be combined with clinical observations, patient history, and epidemiological information.  The expected result is Negative.  Fact Sheet for Patients:  https://www.moore.com/https://www.fda.gov/media/142436/download  Fact Sheet for Healthcare Providers:  https://www.young.biz/https://www.fda.gov/media/142435/download  This test is no                          t yet approved or cleared by the [place] FDA and  has been authorized for detection and/or diagnosis of SARS-CoV-2 by FDA under an Emergency Use Authorization (EUA). This EUA will remain  in effect (meaning this test can be used) for the duration of the COVID-19 declaration under  Section 564(b)(1) of the Act, 21 U.S.C. section 360bbb-3(b)(1), unless the authorization is terminated or revoked sooner.    . Influenza A by PCR 12/26/2019 NEGATIVE  NEGATIVE Final  . Influenza B by PCR 12/26/2019 NEGATIVE  NEGATIVE Final   Comment: (NOTE) The Xpert Xpress SARS-CoV-2/FLU/RSV assay is intended as an aid in  the diagnosis of influenza from Nasopharyngeal swab specimens and  should not be used as a sole basis for treatment. Nasal washings and  aspirates are unacceptable for Xpert Xpress SARS-CoV-2/FLU/RSV  testing.  Fact Sheet for Patients: https://www.moore.com/  Fact Sheet for Healthcare Providers: https://www.young.biz/  This test is not yet approved or cleared by the [place] FDA and  has been authorized for detection and/or diagnosis of SARS-CoV-2 by  FDA under an Emergency Use Authorization (EUA). This EUA will remain  in effect (meaning this test can be used) for the duration of the  Covid-19 declaration under Section 564(b)(1) of the Act, 21  U.S.C. section 360bbb-3(b)(1), unless the authorization is  terminated or revoked. Performed at [facility], [address] [place], [place] [address]   . WBC 12/26/2019 8.3  4.0 - 10.5 K/uL Final  . RBC 12/26/2019 4.52  3.87 - 5.11 MIL/uL Final  . Hemoglobin 12/26/2019 14.2  12.0 - 15.0 g/dL Final  . HCT 30/86/5784 41.4  36 - 46 % Final  . MCV 12/26/2019  91.6  80.0 - 100.0 fL Final  . MCH 12/26/2019 31.4  26.0 - 34.0 pg Final  . MCHC 12/26/2019 34.3  30.0 - 36.0 g/dL Final  . RDW 69/62/9528 12.8  11.5 - 15.5 % Final  . Platelets 12/26/2019 PLATELET CLUMPS NOTED ON SMEAR, UNABLE TO ESTIMATE  150 - 400 K/uL Final   Comment: REPEATED TO VERIFY SPECIMEN CHECKED FOR CLOTS   . nRBC 12/26/2019 0.0  0.0 - 0.2 % Final  . Neutrophils Relative % 12/26/2019 52  % Final  . Neutro Abs 12/26/2019 4.3  1.7 - 7.7 K/uL Final  . Lymphocytes Relative 12/26/2019 34  % Final  . Lymphs Abs 12/26/2019 2.8  0.7 - 4.0 K/uL Final  . Monocytes Relative 12/26/2019 8  % Final  . Monocytes Absolute 12/26/2019 0.7  0.1 - 1.0 K/uL Final  . Eosinophils Relative 12/26/2019 5  % Final  . Eosinophils Absolute 12/26/2019 0.4  0.0 - 0.5 K/uL Final  . Basophils Relative 12/26/2019 1  % Final  . Basophils Absolute 12/26/2019 0.0  0.0 - 0.1 K/uL Final  . Immature Granulocytes 12/26/2019 0  % Final  . Abs Immature Granulocytes 12/26/2019 0.01  0.00 - 0.07 K/uL Final   Performed at [facility], [address] [place], [place] [address]  . Sodium 12/26/2019 138  135 - 145 mmol/L Final  . Potassium 12/26/2019 3.6  3.5 - 5.1 mmol/L Final  . Chloride 12/26/2019 102  98 - 111 mmol/L Final  . CO2 12/26/2019 26  22 - 32 mmol/L Final  . Glucose, Bld 12/26/2019 93  70 - 99 mg/dL Final   Glucose reference range applies only to samples taken after fasting for at least 8 hours.  . BUN 12/26/2019 6  6 - 20 mg/dL Final  . Creatinine, Ser 12/26/2019 0.79  0.44 - 1.00 mg/dL Final  . Calcium 40/12/2723 9.6  8.9 - 10.3 mg/dL Final  . Total Protein 12/26/2019 7.4  6.5 - 8.1 g/dL Final  . Albumin 36/64/4034 4.1  3.5 - 5.0 g/dL Final  . AST 74/25/9563 24  15 -  41 U/L Final  . ALT 12/26/2019 28  0 - 44 U/L Final  . Alkaline Phosphatase 12/26/2019 55  38 - 126 U/L Final  . Total Bilirubin 12/26/2019 1.0  0.3 - 1.2 mg/dL Final  . GFR, Estimated 12/26/2019 >60  >60 mL/min Final  . Anion  gap 12/26/2019 10  5 - 15 Final   Performed at [facility], [address], [place], [place] [address]  . Cholesterol 12/26/2019 150  0 - 200 mg/dL Final  . Triglycerides 12/26/2019 46  <150 mg/dL Final  . HDL 60/45/4098 57  >40 mg/dL Final  . Total CHOL/HDL Ratio 12/26/2019 2.6  RATIO Final  . VLDL 12/26/2019 9  0 - 40 mg/dL Final  . LDL Cholesterol 12/26/2019 84  0 - 99 mg/dL Final   Comment:        Total Cholesterol/HDL:CHD Risk Coronary Heart Disease Risk Table                     Men   Women  1/2 Average Risk   3.4   3.3  Average Risk       5.0   4.4  2 X Average Risk   9.6   7.1  3 X Average Risk  23.4   11.0        Use the calculated Patient Ratio above and the CHD Risk Table to determine the patient's CHD Risk.        ATP III CLASSIFICATION (LDL):  <100     mg/dL   Optimal  119-147  mg/dL   Near or Above                    Optimal  130-159  mg/dL   Borderline  829-562  mg/dL   High  >130     mg/dL   Very High Performed at [facility], [address] [place], [place] [address]   . TSH 12/26/2019 0.869  0.350 - 4.500 uIU/mL Final   Comment: Performed by a 3rd Generation assay with a functional sensitivity of <=0.01 uIU/mL. Performed at [facility], [address] [place], [place] [address]   . POC Amphetamine UR 12/26/2019 None Detected  None Detected Final  . POC Secobarbital (BAR) 12/26/2019 None Detected  None Detected Final  . POC Buprenorphine (BUP) 12/26/2019 None Detected  None Detected Final  . POC Oxazepam (BZO) 12/26/2019 None Detected  None Detected Final  . POC Cocaine UR 12/26/2019 None Detected  None Detected Final  . POC Methamphetamine UR 12/26/2019 None Detected  None Detected Final  . POC Morphine 12/26/2019 None Detected  None Detected Final  . POC Oxycodone UR 12/26/2019 None Detected  None Detected Final  . POC Methadone UR 12/26/2019 None Detected  None Detected Final  . POC Marijuana UR 12/26/2019 Positive* None Detected  Final  . Preg Test, Ur 12/26/2019 NEGATIVE  NEGATIVE Final   Comment:        THE SENSITIVITY OF THIS METHODOLOGY IS >24 mIU/mL   . SARS Coronavirus 2 Ag 12/26/2019 Negative  Negative Preliminary    Allergies: Red dye and Zyprexa [olanzapine]  PTA Medications: (Not in a hospital admission)   Medical Decision Making  Admission labs ordered  Restart abilify at 5 mg daily for schizoaffective disorder  Clinical Course as of Dec 27 439  Tue Dec 27, 2019  0216 UDS positive for [facility] otherwise negative  POCT Urine Drug Screen - (ICup)(!) [JB]  0216 CBC unremarkable  CBC with Differential/Platelet [JB]  0216 CMP unremarkable  Comprehensive metabolic panel [JB]  0216 TSH: 0.869 [JB]    Clinical Course User Index [JB] Jackelyn Poling, NP    Recommendations  Based on my evaluation the patient does not appear to have an emergency medical condition.   Patient will be placed in the continuous assessment area at [facility] for treatment and stabilization. She will be reevaluated on 12/27/2019. The treatment team will determine disposition at that time.      Jackelyn Poling, NP 12/27/19  4:41 AM

## 2019-12-26 NOTE — ED Notes (Signed)
Patient belongings placed in [place] [address]
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
## 2019-12-26 NOTE — ED Triage Notes (Signed)
Pt very sleepy upon assessment, unable to stay awake for more than a minute or two at a time. Does endorse SI but states no plan or intent. Cooperative with labs and skin assessment. Ambulates without issue. Shown to pullout in obs.

## 2019-12-27 LAB — CBC WITH DIFFERENTIAL/PLATELET
Abs Immature Granulocytes: 0.01 10*3/uL (ref 0.00–0.07)
Basophils Absolute: 0 10*3/uL (ref 0.0–0.1)
Basophils Relative: 1 %
Eosinophils Absolute: 0.4 10*3/uL (ref 0.0–0.5)
Eosinophils Relative: 5 %
HCT: 41.4 % (ref 36.0–46.0)
Hemoglobin: 14.2 g/dL (ref 12.0–15.0)
Immature Granulocytes: 0 %
Lymphocytes Relative: 34 %
Lymphs Abs: 2.8 10*3/uL (ref 0.7–4.0)
MCH: 31.4 pg (ref 26.0–34.0)
MCHC: 34.3 g/dL (ref 30.0–36.0)
MCV: 91.6 fL (ref 80.0–100.0)
Monocytes Absolute: 0.7 10*3/uL (ref 0.1–1.0)
Monocytes Relative: 8 %
Neutro Abs: 4.3 10*3/uL (ref 1.7–7.7)
Neutrophils Relative %: 52 %
Platelets: UNDETERMINED 10*3/uL (ref 150–400)
RBC: 4.52 MIL/uL (ref 3.87–5.11)
RDW: 12.8 % (ref 11.5–15.5)
WBC: 8.3 10*3/uL (ref 4.0–10.5)
nRBC: 0 % (ref 0.0–0.2)

## 2019-12-27 LAB — RESPIRATORY PANEL BY RT PCR (FLU A&B, COVID)
Influenza A by PCR: NEGATIVE
Influenza B by PCR: NEGATIVE
SARS Coronavirus 2 by RT PCR: NEGATIVE

## 2019-12-27 LAB — COMPREHENSIVE METABOLIC PANEL
ALT: 28 U/L (ref 0–44)
AST: 24 U/L (ref 15–41)
Albumin: 4.1 g/dL (ref 3.5–5.0)
Alkaline Phosphatase: 55 U/L (ref 38–126)
Anion gap: 10 (ref 5–15)
BUN: 6 mg/dL (ref 6–20)
CO2: 26 mmol/L (ref 22–32)
Calcium: 9.6 mg/dL (ref 8.9–10.3)
Chloride: 102 mmol/L (ref 98–111)
Creatinine, Ser: 0.79 mg/dL (ref 0.44–1.00)
GFR, Estimated: >60 mL/min (ref >60)
Glucose, Bld: 93 mg/dL (ref 70–99)
Potassium: 3.6 mmol/L (ref 3.5–5.1)
Sodium: 138 mmol/L (ref 135–145)
Total Bilirubin: 1 mg/dL (ref 0.3–1.2)
Total Protein: 7.4 g/dL (ref 6.5–8.1)

## 2019-12-27 LAB — LIPID PANEL
Cholesterol: 150 mg/dL (ref 0–200)
HDL: 57 mg/dL (ref >40)
LDL Cholesterol: 84 mg/dL (ref 0–99)
Total CHOL/HDL Ratio: 2.6 RATIO
Triglycerides: 46 mg/dL (ref <150)
VLDL: 9 mg/dL (ref 0–40)

## 2019-12-27 LAB — TSH: TSH: 0.869 u[IU]/mL (ref 0.350–4.500)

## 2019-12-27 MED ORDER — ARIPIPRAZOLE 5 MG PO TABS: 5.0000 mg | ORAL_TABLET | Freq: Every day | ORAL | Status: DC

## 2019-12-27 MED ORDER — ARIPIPRAZOLE 10 MG PO TABS
10.0000 mg | ORAL_TABLET | Freq: Every day | ORAL | Status: DC
  Administered 2019-12-27: 10 mg via ORAL
  Filled 2019-12-27: qty 7
  Filled 2019-12-27: qty 1

## 2019-12-27 MED ORDER — ARIPIPRAZOLE 10 MG PO TABS: 10.0000 mg | ORAL_TABLET | Freq: Every day | ORAL | 0 refills | Status: DC

## 2019-12-27 NOTE — ED Notes (Signed)
Nutrigrain bar and OJ

## 2019-12-27 NOTE — Discharge Instructions (Signed)

## 2019-12-27 NOTE — ED Provider Notes (Signed)
[facility]BC/OBS ASAP Discharge Summary  Date and Time: 12/27/2019 10:19 AM  Name: Betty Parrish  MRN:  [id number]   Discharge Diagnoses:  Final diagnoses:  Schizoaffective disorder, bipolar type (HCC)    Subjective: Patient reports today that she is feeling better.  She denies having any suicidal or homicidal ideations and denies any hallucinations.  Patient reports that she feels that she does need to be restarted on her medications.  She states that now that she is got her medications that she can follow-up with outpatient.  Patient states that she is trying to get her own place to live and is currently living with her mom but her mom wants her to move out as soon as possible.  Patient reports that she gets a disability check the beginning of the month that she plans on finding her own place to live.  Patient states that the Abilify does work for her and that she plans to continue it.  She reports that she was going to [place] but has not been there in a while and states that she will be open to coming to the open Access at the [facility].  Stay Summary: Patient is a 29 year old female that presented to the [facility] voluntarily reporting suicidal ideations and patient has a history of schizoaffective disorder.  Patient is drowsy but arouses to voice.  Patient was transferred to the [facility] for overnight observation.  Patient was restarted on her Abilify 10 mg p.o. daily.  Today the patient had reported that she does need to get in and get restarted on her medications and she can follow-up with outpatient.  She states that she used to go to [place] but has not been in a while.  Patient was informed that the [facility] can see her upstairs for outpatient services and she request information for the walk-in appointments.  Patient had continued to deny any suicidal or homicidal ideations and denied any hallucinations.  Patient was provided with a prescription of her Abilify 10 mg p.o. daily and  also with information for open access at the [facility].   Total Time spent with patient: 30 minutes  Past Psychiatric History: schizoaffective disorder, ADHD, anxiety Past Medical History:  Past Medical History:  Diagnosis Date  . ADHD (attention deficit hyperactivity disorder)   . Anxiety   . Heart murmur   . Herpes   . Schizo-affective psychosis (HCC)   . Sickle cell trait (HCC)    History reviewed. No pertinent surgical history. Family History:  Family History  Problem Relation Age of Onset  . Sickle cell anemia Mother    Family Psychiatric History: None reported Social History:  Social History   Substance and Sexual Activity  Alcohol Use No     Social History   Substance and Sexual Activity  Drug Use Yes  . Types: Marijuana, Cocaine   Comment: last used marijuana yesterday, denies cocaine use    Social History   Socioeconomic History  . Marital status: Significant Other    Spouse name: Not on file  . Number of children: Not on file  . Years of education: Not on file  . Highest education level: Not on file  Occupational History  . Not on file  Tobacco Use  . Smoking status: Current Every Day Smoker    Packs/day: 1.00    Types: Cigarettes  . Smokeless tobacco: Never Used  Vaping Use  . Vaping Use: Never used  Substance and Sexual Activity  .  Alcohol use: No  . Drug use: Yes    Types: Marijuana, Cocaine    Comment: last used marijuana yesterday, denies cocaine use  . Sexual activity: Yes    Birth control/protection: I.U.D.  Other Topics Concern  . Not on file  Social History Narrative  . Not on file   Social Determinants of Health   Financial Resource Strain:   . Difficulty of Paying Living Expenses: Not on file  Food Insecurity:   . Worried About Programme researcher, broadcasting/film/video in the Last Year: Not on file  . Ran Out of Food in the Last Year: Not on file  Transportation Needs:   . Lack of Transportation (Medical): Not on file  . Lack of Transportation  (Non-Medical): Not on file  Physical Activity:   . Days of Exercise per Week: Not on file  . Minutes of Exercise per Session: Not on file  Stress:   . Feeling of Stress : Not on file  Social Connections:   . Frequency of Communication with Friends and Family: Not on file  . Frequency of Social Gatherings with Friends and Family: Not on file  . Attends Religious Services: Not on file  . Active Member of Clubs or Organizations: Not on file  . Attends Banker Meetings: Not on file  . Marital Status: Not on file   SDOH:  SDOH Screenings   Alcohol Screen: Low Risk   . Last Alcohol Screening Score (AUDIT): 0  Depression (PHQ2-9):   . PHQ-2 Score: Not on file  Financial Resource Strain:   . Difficulty of Paying Living Expenses: Not on file  Food Insecurity:   . Worried About Programme researcher, broadcasting/film/video in the Last Year: Not on file  . Ran Out of Food in the Last Year: Not on file  Housing:   . Last Housing Risk Score: Not on file  Physical Activity:   . Days of Exercise per Week: Not on file  . Minutes of Exercise per Session: Not on file  Social Connections:   . Frequency of Communication with Friends and Family: Not on file  . Frequency of Social Gatherings with Friends and Family: Not on file  . Attends Religious Services: Not on file  . Active Member of Clubs or Organizations: Not on file  . Attends Banker Meetings: Not on file  . Marital Status: Not on file  Stress:   . Feeling of Stress : Not on file  Tobacco Use: High Risk  . Smoking Tobacco Use: Current Every Day Smoker  . Smokeless Tobacco Use: Never Used  Transportation Needs:   . Freight forwarder (Medical): Not on file  . Lack of Transportation (Non-Medical): Not on file    Has this patient used any form of tobacco in the last 30 days? (Cigarettes, Smokeless Tobacco, Cigars, and/or Pipes) Prescription not provided because: doesn't smoke  Current Medications:  Current  Facility-Administered Medications  Medication Dose Route Frequency Provider Last Rate Last Admin  . acetaminophen (TYLENOL) tablet 650 mg  650 mg Oral Q6H PRN Nira Conn A, NP      . alum & mag hydroxide-simeth (MAALOX/MYLANTA) 200-200-20 MG/5ML suspension 30 mL  30 mL Oral Q4H PRN Nira Conn A, NP      . ARIPiprazole (ABILIFY) tablet 10 mg  10 mg Oral Daily Adam Demary, Feliz Beam B, FNP   10 mg at 12/27/19 0900  . magnesium hydroxide (MILK OF MAGNESIA) suspension 30 mL  30 mL Oral Daily PRN Allyson Sabal,  Tomasa Hose, NP       Current Outpatient Medications  Medication Sig Dispense Refill  . ARIPiprazole (ABILIFY) 15 MG tablet Take 15 mg by mouth daily.    . traZODone (DESYREL) 50 MG tablet Take 50 mg by mouth at bedtime.    Marland Kitchen amoxicillin (AMOXIL) 500 MG capsule Take 1 capsule (500 mg total) by mouth 3 (three) times daily. (Patient not taking: Reported on 12/27/2019) 21 capsule 0  . ibuprofen (ADVIL) 600 MG tablet Take 1 tablet (600 mg total) by mouth every 8 (eight) hours as needed. Take with food. (Patient not taking: Reported on 12/27/2019) 20 tablet 0    PTA Medications: (Not in a hospital admission)   Musculoskeletal  Strength & Muscle Tone: within normal limits Gait & Station: normal Patient leans: N/A  Psychiatric Specialty Exam  Presentation  General Appearance: Appropriate for Environment;Disheveled  Eye Contact:Fair  Speech:Clear and Coherent;Normal Rate  Speech Volume:Normal  Handedness:Right   Mood and Affect  Mood:Euthymic  Affect:Appropriate;Congruent   Thought Process  Thought Processes:Coherent  Descriptions of Associations:Intact  Orientation:Full (Time, Place and Person)  Thought Content:WDL  Hallucinations:Hallucinations: None Description of Auditory Hallucinations: patient reports AVH, however, she makes unintelligble comments about AVH  Ideas of Reference:None  Suicidal Thoughts:Suicidal Thoughts: No  Homicidal Thoughts:Homicidal Thoughts:  No   Sensorium  Memory:Immediate Good;Recent Good;Remote Good  Judgment:Fair  Insight:Fair   Executive Functions  Concentration:Good  Attention Span:Good  Recall:Good  Fund of Knowledge:Good  Language:Good   Psychomotor Activity  Psychomotor Activity:Psychomotor Activity: Normal   Assets  Assets:Communication Skills;Desire for Improvement;Housing;Physical Health;Social Support   Sleep  Sleep:Sleep: Good   Physical Exam  Physical Exam Vitals and nursing note reviewed.  Constitutional:      Appearance: She is well-developed.  HENT:     Head: Normocephalic.  Eyes:     Pupils: Pupils are equal, round, and reactive to light.  Cardiovascular:     Rate and Rhythm: Normal rate.  Pulmonary:     Effort: Pulmonary effort is normal.  Musculoskeletal:        General: Normal range of motion.  Neurological:     Mental Status: She is alert and oriented to person, place, and time.    Review of Systems  Constitutional: Negative.   HENT: Negative.   Eyes: Negative.   Respiratory: Negative.   Cardiovascular: Negative.   Gastrointestinal: Negative.   Genitourinary: Negative.   Musculoskeletal: Negative.   Skin: Negative.   Neurological: Negative.   Endo/Heme/Allergies: Negative.   Psychiatric/Behavioral: Negative.  Depression: due to recent break up.   Blood pressure 126/78, pulse 64, temperature 99 F (37.2 C), temperature source Oral, resp. rate 18, SpO2 100 %. There is no height or weight on file to calculate BMI.  Demographic Factors:  Low socioeconomic status  Loss Factors: NA  Historical Factors: Prior suicide attempts  Risk Reduction Factors:   Sense of responsibility to family, Living with another person, especially a relative, Positive social support and Positive therapeutic relationship  Continued Clinical Symptoms:  Previous Psychiatric Diagnoses and Treatments  Cognitive Features That Contribute To Risk:  None    Suicide Risk:  Mild:   Suicidal ideation of limited frequency, intensity, duration, and specificity.  There are no identifiable plans, no associated intent, mild dysphoria and related symptoms, good self-control (both objective and subjective assessment), few other risk factors, and identifiable protective factors, including available and accessible social support.  Plan Of Care/Follow-up recommendations:  Continue activity as tolerated. Continue diet as recommended by your PCP.  Ensure to keep all appointments with outpatient providers.  Disposition: Discharge home  Maryfrances Bunnellravis B Carrine Kroboth, FNP 12/27/2019, 10:19 AM

## 2019-12-27 NOTE — ED Notes (Signed)
Patient denies SI/HI. Patient did talk minimal with Clinical research associate. Monitoring continues.

## 2019-12-27 NOTE — ED Notes (Signed)
Nurse Discharge Note:  D:Patient denies SI/HI/AVH at this time. Pt appears calm and cooperative, and no distress noted.  A: All Personal items in locker returned to pt. Pt given AVS/Follow-up/Prescriptions. Copy prescriptions with samples and AVS given to patient.Pt escorted to the lobby to catch bus home.  R:  Pt States she will comply with outpatient  services, and take medications as prescribed.

## 2019-12-28 LAB — HEMOGLOBIN A1C
Hgb A1c MFr Bld: 5.1 % (ref 4.8–5.6)
Mean Plasma Glucose: 100 mg/dL

## 2020-01-22 ENCOUNTER — Ambulatory Visit (HOSPITAL_COMMUNITY)
Admission: EM | Admit: 2020-01-22 | Discharge: 2020-01-22 | Disposition: A | Discharge status: Home / Self Care | Payer: Medicaid Other | Attending: Behavioral Health | Admitting: Behavioral Health

## 2020-01-22 ENCOUNTER — Other Ambulatory Visit: Payer: Self-pay

## 2020-01-22 DIAGNOSIS — F259 Schizoaffective disorder, unspecified: Secondary | ICD-10-CM | POA: Insufficient documentation

## 2020-01-22 DIAGNOSIS — Z20822 Contact with and (suspected) exposure to covid-19: Secondary | ICD-10-CM | POA: Insufficient documentation

## 2020-01-22 DIAGNOSIS — F17210 Nicotine dependence, cigarettes, uncomplicated: Secondary | ICD-10-CM | POA: Insufficient documentation

## 2020-01-22 LAB — LIPID PANEL
Cholesterol: 136 mg/dL (ref 0–200)
HDL: 57 mg/dL (ref >40)
LDL Cholesterol: 69 mg/dL (ref 0–99)
Total CHOL/HDL Ratio: 2.4 RATIO
Triglycerides: 48 mg/dL (ref <150)
VLDL: 10 mg/dL (ref 0–40)

## 2020-01-22 LAB — CBC WITH DIFFERENTIAL/PLATELET
Abs Immature Granulocytes: 0.01 10*3/uL (ref 0.00–0.07)
Basophils Absolute: 0.1 10*3/uL (ref 0.0–0.1)
Basophils Relative: 1 %
Eosinophils Absolute: 0.3 10*3/uL (ref 0.0–0.5)
Eosinophils Relative: 4 %
HCT: 43.5 % (ref 36.0–46.0)
Hemoglobin: 14.8 g/dL (ref 12.0–15.0)
Immature Granulocytes: 0 %
Lymphocytes Relative: 40 %
Lymphs Abs: 2.6 10*3/uL (ref 0.7–4.0)
MCH: 31.6 pg (ref 26.0–34.0)
MCHC: 34 g/dL (ref 30.0–36.0)
MCV: 92.8 fL (ref 80.0–100.0)
Monocytes Absolute: 0.8 10*3/uL (ref 0.1–1.0)
Monocytes Relative: 13 %
Neutro Abs: 2.8 10*3/uL (ref 1.7–7.7)
Neutrophils Relative %: 42 %
Platelets: 178 10*3/uL (ref 150–400)
RBC: 4.69 MIL/uL (ref 3.87–5.11)
RDW: 13 % (ref 11.5–15.5)
WBC: 6.5 10*3/uL (ref 4.0–10.5)
nRBC: 0 % (ref 0.0–0.2)

## 2020-01-22 LAB — GLUCOSE, CAPILLARY: Glucose-Capillary: 85 mg/dL (ref 70–99)

## 2020-01-22 LAB — HEMOGLOBIN A1C
Hgb A1c MFr Bld: 5.2 % (ref 4.8–5.6)
Mean Plasma Glucose: 102.54 mg/dL

## 2020-01-22 LAB — COMPREHENSIVE METABOLIC PANEL
ALT: 37 U/L (ref 0–44)
AST: 33 U/L (ref 15–41)
Albumin: 4.3 g/dL (ref 3.5–5.0)
Alkaline Phosphatase: 51 U/L (ref 38–126)
Anion gap: 12 (ref 5–15)
BUN: 7 mg/dL (ref 6–20)
CO2: 23 mmol/L (ref 22–32)
Calcium: 9.6 mg/dL (ref 8.9–10.3)
Chloride: 102 mmol/L (ref 98–111)
Creatinine, Ser: 0.77 mg/dL (ref 0.44–1.00)
GFR, Estimated: >60 mL/min (ref >60)
Glucose, Bld: 85 mg/dL (ref 70–99)
Potassium: 3.4 mmol/L — ABNORMAL LOW (ref 3.5–5.1)
Sodium: 137 mmol/L (ref 135–145)
Total Bilirubin: 1.2 mg/dL (ref 0.3–1.2)
Total Protein: 7.3 g/dL (ref 6.5–8.1)

## 2020-01-22 LAB — POCT URINE DRUG SCREEN - MANUAL ENTRY (I-SCREEN)
POC Amphetamine UR: NOT DETECTED
POC Buprenorphine (BUP): NOT DETECTED
POC Cocaine UR: NOT DETECTED
POC Marijuana UR: POSITIVE — AB
POC Methadone UR: NOT DETECTED
POC Methamphetamine UR: NOT DETECTED
POC Morphine: NOT DETECTED
POC Oxazepam (BZO): NOT DETECTED
POC Oxycodone UR: NOT DETECTED
POC Secobarbital (BAR): NOT DETECTED

## 2020-01-22 LAB — RESPIRATORY PANEL BY RT PCR (FLU A&B, COVID)
Influenza A by PCR: NEGATIVE
Influenza B by PCR: NEGATIVE
SARS Coronavirus 2 by RT PCR: NEGATIVE

## 2020-01-22 LAB — POC SARS CORONAVIRUS 2 AG -  ED: SARS Coronavirus 2 Ag: NEGATIVE

## 2020-01-22 LAB — ETHANOL: Alcohol, Ethyl (B): <10 mg/dL (ref <10)

## 2020-01-22 LAB — TSH: TSH: 0.621 u[IU]/mL (ref 0.350–4.500)

## 2020-01-22 LAB — POCT PREGNANCY, URINE: Preg Test, Ur: NEGATIVE

## 2020-01-22 LAB — MAGNESIUM: Magnesium: 2.2 mg/dL (ref 1.7–2.4)

## 2020-01-22 MED ORDER — ARIPIPRAZOLE 10 MG PO TABS: 10.0000 mg | ORAL_TABLET | Freq: Every day | ORAL | 0 refills | Status: DC

## 2020-01-22 MED ORDER — ALUM & MAG HYDROXIDE-SIMETH 200-200-20 MG/5ML PO SUSP: 30.0000 mL | ORAL | Status: DC | PRN

## 2020-01-22 MED ORDER — ACETAMINOPHEN 325 MG PO TABS: 650.0000 mg | ORAL_TABLET | Freq: Four times a day (QID) | ORAL | Status: DC | PRN

## 2020-01-22 MED ORDER — ARIPIPRAZOLE 10 MG PO TABS
10.0000 mg | ORAL_TABLET | Freq: Every day | ORAL | Status: DC
  Administered 2020-01-22: 10 mg via ORAL
  Filled 2020-01-22: qty 1

## 2020-01-22 MED ORDER — MAGNESIUM HYDROXIDE 400 MG/5ML PO SUSP: 30.0000 mL | Freq: Every day | ORAL | Status: DC | PRN

## 2020-01-22 NOTE — ED Notes (Signed)
Breakfast given.  

## 2020-01-22 NOTE — Discharge Instructions (Signed)
Take all medications as prescribed. Keep all follow-up appointments as scheduled.  Do not consume alcohol or use illegal drugs while on prescription medications. Report any adverse effects from your medications to your primary care provider promptly.  In the event of recurrent symptoms or worsening symptoms, call 911, a crisis hotline, or go to the nearest emergency department for evaluation.   

## 2020-01-22 NOTE — ED Notes (Signed)
LOCKER #[id number] 

## 2020-01-22 NOTE — ED Notes (Signed)
Pt sleeping in no acute distress. RR even and nonlabored. Safety maintained. 

## 2020-01-22 NOTE — ED Triage Notes (Signed)
Pt arrives via [name] with complaint of suicidal thought without plan going on for about a week. Pt is requesting therapy, a prescription for abilify and an ACTT team. Pt A&Ox4, very sleepy and needing to be aroused multiple times during assessment. In no acute distress at this time.

## 2020-01-22 NOTE — ED Notes (Signed)
Patient A&O x 4, ambulatory. Patient discharged in no acute distress. Patient denied SI/HI, A/VH upon discharge. Patient verbalized understanding of all discharge instructions explained by staff, to include follow up appointments, RX's and safety plan. Pt belongings returned to patient from locker #15 intact. Patient escorted to lobby via staff for transport to destination via bus pass. Safety maintained.

## 2020-01-22 NOTE — ED Provider Notes (Signed)
Behavioral Health Admission [facility] & OBS)  Date: 01/22/20 Patient Name: Betty Parrish MRN: [id number] Chief Complaint:  Chief Complaint  Patient presents with  . Suicidal      Diagnoses: Schizo-affective psychosis (HCC)  HPI: Betty Parrish is a 29 year old female who presents voluntarily and unaccompanied to [facility] for her suicidal ideation.The patient was seen three weeks ago and was given a prescription for Abilify 10 mg daily.  The patient disclosed she misplaced it at someone's home. Therefore, she never got the medication. She is here asking for the prescription and requesting an ACT-Team. The patient does not appear to be responding to internal or external stimuli. Neither is the patient presenting with any delusional thinking. The patient admits to auditory and visual hallucinations. The patient admits to suicidal ideation but denies homicidal or self-harm ideations. The patient is not presenting with any psychotic or paranoid behaviors. During an encounter with the patient, she was able to answer questions appropriately. During the assessment, the patient presented as alert and oriented.  She had poor eye contact. Her demeanor was withdrawn. The patient was dressed in street clothes, and he appeared appropriately groomed. The patient's mood was depressed and irritated.  Affect was mood-congruent. The patient's speech was normal in rate, rhythm, and volume.  Thought processes were within normal range and thought content was goal-oriented.  There was no evidence of delusion. The patient's memory and concentration were fair.  Insight, judgment, and impulse control were fair to poor.  PHQ 2-9:     ED from 12/26/2019 in [facility] Admission (Discharged) from 03/26/2019 in [facility]AV[facility]AL [facility]ALTH OBSERVATION UNIT Admission (Discharged) from [facility]05/[facility]  C-SSRS RISK CATEGORY Low Risk Low Risk No Risk       Total Time spent  with patient: 20 minutes  Musculoskeletal  Strength & Muscle Tone: within normal limits Gait & Station: normal Patient leans: N/A  Psychiatric Specialty Exam  Presentation General Appearance: Appropriate for Environment  Eye Contact:Minimal  Speech:Clear and Coherent  Speech Volume:Normal  Handedness:Right   Mood and Affect  Mood:Euthymic  Affect:Appropriate;Congruent   Thought Process  Thought Processes:Coherent  Descriptions of Associations:Intact  Orientation:Full (Time, Place and Person)  Thought Content:WDL  Hallucinations:Hallucinations: Auditory Description of Auditory Hallucinations: To shoot herself  Ideas of Reference:None  Suicidal Thoughts:Suicidal Thoughts: Yes, Active SI Active Intent and/or Plan: With Intent;With Plan;With Means to Carry Out;With Access to Means  Homicidal Thoughts:Homicidal Thoughts: No   Sensorium  Memory:Immediate Good;Recent Good;Remote Good  Judgment:Fair  Insight:Fair   Executive Functions  Concentration:Fair  Attention Span:Fair  Recall:Good  Fund of Knowledge:Good  Language:Good   Psychomotor Activity  Psychomotor Activity:Psychomotor Activity: Normal   Assets  Assets:Communication Skills;Desire for Improvement;Housing;Physical Health;Social Support   Sleep  Sleep:Sleep: Fair   Physical Exam Vitals and nursing note reviewed.  Constitutional:      Appearance: She is normal weight.  HENT:     Nose: Nose normal.     Mouth/Throat:     Mouth: Mucous membranes are moist.     Pharynx: Oropharynx is clear.  Cardiovascular:     Rate and Rhythm: Normal rate.  Pulmonary:     Effort: Pulmonary effort is normal.  Musculoskeletal:        General: Normal range of motion.     Cervical back: Normal range of motion and neck supple.  Neurological:     General: No focal deficit present.     Mental Status: She is alert and oriented  to person, place, and time. Mental status is at baseline.  Psychiatric:         Attention and Perception: She perceives auditory and visual hallucinations.        Mood and Affect: Mood is anxious and depressed.        Speech: Speech normal.        Behavior: Behavior normal. Behavior is cooperative.        Thought Content: Thought content includes suicidal ideation. Thought content includes suicidal plan.        Cognition and Memory: Cognition and memory normal.        Judgment: Judgment is impulsive and inappropriate.    Review of Systems  Psychiatric/Behavioral: Positive for depression, hallucinations and suicidal ideas. The patient is nervous/anxious.     Blood pressure 119/76, pulse 72, temperature 97.8 F (36.6 C), temperature source Tympanic, resp. rate 18, height 5' 8.5" (1.74 m), weight 213 lb (96.6 kg), SpO2 100 %. Body mass index is 31.91 kg/m.  Past Psychiatric History:   Is the patient at risk to self? Yes  Has the patient been a risk to self in the past 6 months? Yes .    Has the patient been a risk to self within the distant past? No   Is the patient a risk to others? No   Has the patient been a risk to others in the past 6 months? No   Has the patient been a risk to others within the distant past? No   Past Medical History:  Past Medical History:  Diagnosis Date  . ADHD (attention deficit hyperactivity disorder)   . Anxiety   . Heart murmur   . Herpes   . Schizo-affective psychosis (HCC)   . Sickle cell trait (HCC)    No past surgical history on file.  Family History:  Family History  Problem Relation Age of Onset  . Sickle cell anemia Mother     Social History:  Social History   Socioeconomic History  . Marital status: Significant Other    Spouse name: Not on file  . Number of children: Not on file  . Years of education: Not on file  . Highest education level: Not on file  Occupational History  . Not on file  Tobacco Use  . Smoking status: Current Every Day Smoker    Packs/day: 1.00    Types: Cigarettes  .  Smokeless tobacco: Never Used  Vaping Use  . Vaping Use: Never used  Substance and Sexual Activity  . Alcohol use: No  . Drug use: Yes    Types: Marijuana, Cocaine    Comment: last used marijuana yesterday, denies cocaine use  . Sexual activity: Yes    Birth control/protection: I.U.D.  Other Topics Concern  . Not on file  Social History Narrative  . Not on file   Social Determinants of Health   Financial Resource Strain:   . Difficulty of Paying Living Expenses: Not on file  Food Insecurity:   . Worried About Programme researcher, broadcasting/film/videounning Out of Food in the Last Year: Not on file  . Ran Out of Food in the Last Year: Not on file  Transportation Needs:   . Lack of Transportation (Medical): Not on file  . Lack of Transportation (Non-Medical): Not on file  Physical Activity:   . Days of Exercise per Week: Not on file  . Minutes of Exercise per Session: Not on file  Stress:   . Feeling of Stress : Not on file  Social Connections:   . Frequency of Communication with Friends and Family: Not on file  . Frequency of Social Gatherings with Friends and Family: Not on file  . Attends Religious Services: Not on file  . Active Member of Clubs or Organizations: Not on file  . Attends Banker Meetings: Not on file  . Marital Status: Not on file  Intimate Partner Violence:   . Fear of Current or Ex-Partner: Not on file  . Emotionally Abused: Not on file  . Physically Abused: Not on file  . Sexually Abused: Not on file    SDOH:  SDOH Screenings   Alcohol Screen: Low Risk   . Last Alcohol Screening Score (AUDIT): 0  Depression (PHQ2-9):   . PHQ-2 Score: Not on file  Financial Resource Strain:   . Difficulty of Paying Living Expenses: Not on file  Food Insecurity:   . Worried About Programme researcher, broadcasting/film/video in the Last Year: Not on file  . Ran Out of Food in the Last Year: Not on file  Housing:   . Last Housing Risk Score: Not on file  Physical Activity:   . Days of Exercise per Week: Not on  file  . Minutes of Exercise per Session: Not on file  Social Connections:   . Frequency of Communication with Friends and Family: Not on file  . Frequency of Social Gatherings with Friends and Family: Not on file  . Attends Religious Services: Not on file  . Active Member of Clubs or Organizations: Not on file  . Attends Banker Meetings: Not on file  . Marital Status: Not on file  Stress:   . Feeling of Stress : Not on file  Tobacco Use: High Risk  . Smoking Tobacco Use: Current Every Day Smoker  . Smokeless Tobacco Use: Never Used  Transportation Needs:   . Freight forwarder (Medical): Not on file  . Lack of Transportation (Non-Medical): Not on file    Last Labs:  Admission on 01/22/2020  Component Date Value Ref Range Status  . SARS Coronavirus 2 Ag 01/22/2020 Negative  Negative Preliminary  . POC Amphetamine UR 01/22/2020 None Detected  None Detected Final  . POC Secobarbital (BAR) 01/22/2020 None Detected  None Detected Final  . POC Buprenorphine (BUP) 01/22/2020 None Detected  None Detected Final  . POC Oxazepam (BZO) 01/22/2020 None Detected  None Detected Final  . POC Cocaine UR 01/22/2020 None Detected  None Detected Final  . POC Methamphetamine UR 01/22/2020 None Detected  None Detected Final  . POC Morphine 01/22/2020 None Detected  None Detected Final  . POC Oxycodone UR 01/22/2020 None Detected  None Detected Final  . POC Methadone UR 01/22/2020 None Detected  None Detected Final  . POC Marijuana UR 01/22/2020 Positive* None Detected Final  . Preg Test, Ur 01/22/2020 NEGATIVE  NEGATIVE Final   Comment:        THE SENSITIVITY OF THIS METHODOLOGY IS >24 mIU/mL   . Glucose-Capillary 01/22/2020 85  70 - 99 mg/dL Final   Glucose reference range applies only to samples taken after fasting for at least 8 hours.  Admission on 12/26/2019, Discharged on 12/27/2019  Component Date Value Ref Range Status  . SARS Coronavirus 2 by RT PCR 12/26/2019 NEGATIVE   NEGATIVE Final   Comment: (NOTE) SARS-CoV-2 target nucleic acids are NOT DETECTED.  The SARS-CoV-2 RNA is generally detectable in upper respiratoy specimens during the acute phase of infection. The lowest concentration of SARS-CoV-2 viral  copies this assay can detect is 131 copies/mL. A negative result does not preclude SARS-Cov-2 infection and should not be used as the sole basis for treatment or other patient management decisions. A negative result may occur with  improper specimen collection/handling, submission of specimen other than nasopharyngeal swab, presence of viral mutation(s) within the areas targeted by this assay, and inadequate number of viral copies (<131 copies/mL). A negative result must be combined with clinical observations, patient history, and epidemiological information. The expected result is Negative.  Fact Sheet for Patients:  https://www.moore.com/  Fact Sheet for Healthcare Providers:  https://www.young.biz/  This test is no                          t yet approved or cleared by the [place] FDA and  has been authorized for detection and/or diagnosis of SARS-CoV-2 by FDA under an Emergency Use Authorization (EUA). This EUA will remain  in effect (meaning this test can be used) for the duration of the COVID-19 declaration under Section 564(b)(1) of the Act, 21 U.S.C. section 360bbb-3(b)(1), unless the authorization is terminated or revoked sooner.    . Influenza A by PCR 12/26/2019 NEGATIVE  NEGATIVE Final  . Influenza B by PCR 12/26/2019 NEGATIVE  NEGATIVE Final   Comment: (NOTE) The Xpert Xpress SARS-CoV-2/FLU/RSV assay is intended as an aid in  the diagnosis of influenza from Nasopharyngeal swab specimens and  should not be used as a sole basis for treatment. Nasal washings and  aspirates are unacceptable for Xpert Xpress SARS-CoV-2/FLU/RSV  testing.  Fact Sheet for  Patients: https://www.moore.com/  Fact Sheet for Healthcare Providers: https://www.young.biz/  This test is not yet approved or cleared by the [place] FDA and  has been authorized for detection and/or diagnosis of SARS-CoV-2 by  FDA under an Emergency Use Authorization (EUA). This EUA will remain  in effect (meaning this test can be used) for the duration of the  Covid-19 declaration under Section 564(b)(1) of the Act, 21  U.S.C. section 360bbb-3(b)(1), unless the authorization is  terminated or revoked. Performed at [facility], [address] [place], [place] [address]   . WBC 12/26/2019 8.3  4.0 - 10.5 K/uL Final  . RBC 12/26/2019 4.52  3.87 - 5.11 MIL/uL Final  . Hemoglobin 12/26/2019 14.2  12.0 - 15.0 g/dL Final  . HCT 60/45/4098 41.4  36 - 46 % Final  . MCV 12/26/2019 91.6  80.0 - 100.0 fL Final  . MCH 12/26/2019 31.4  26.0 - 34.0 pg Final  . MCHC 12/26/2019 34.3  30.0 - 36.0 g/dL Final  . RDW 11/91/4782 12.8  11.5 - 15.5 % Final  . Platelets 12/26/2019 PLATELET CLUMPS NOTED ON SMEAR, UNABLE TO ESTIMATE  150 - 400 K/uL Final   Comment: REPEATED TO VERIFY SPECIMEN CHECKED FOR CLOTS   . nRBC 12/26/2019 0.0  0.0 - 0.2 % Final  . Neutrophils Relative % 12/26/2019 52  % Final  . Neutro Abs 12/26/2019 4.3  1.7 - 7.7 K/uL Final  . Lymphocytes Relative 12/26/2019 34  % Final  . Lymphs Abs 12/26/2019 2.8  0.7 - 4.0 K/uL Final  . Monocytes Relative 12/26/2019 8  % Final  . Monocytes Absolute 12/26/2019 0.7  0.1 - 1.0 K/uL Final  . Eosinophils Relative 12/26/2019 5  % Final  . Eosinophils Absolute 12/26/2019 0.4  0.0 - 0.5 K/uL Final  . Basophils Relative 12/26/2019 1  % Final  . Basophils Absolute  12/26/2019 0.0  0.0 - 0.1 K/uL Final  . Immature Granulocytes 12/26/2019 0  % Final  . Abs Immature Granulocytes 12/26/2019 0.01  0.00 - 0.07 K/uL Final   Performed at [facility], [address], [place], [place] [address]  .  Sodium 12/26/2019 138  135 - 145 mmol/L Final  . Potassium 12/26/2019 3.6  3.5 - 5.1 mmol/L Final  . Chloride 12/26/2019 102  98 - 111 mmol/L Final  . CO2 12/26/2019 26  22 - 32 mmol/L Final  . Glucose, Bld 12/26/2019 93  70 - 99 mg/dL Final   Glucose reference range applies only to samples taken after fasting for at least 8 hours.  . BUN 12/26/2019 6  6 - 20 mg/dL Final  . Creatinine, Ser 12/26/2019 0.79  0.44 - 1.00 mg/dL Final  . Calcium 32/67/1245 9.6  8.9 - 10.3 mg/dL Final  . Total Protein 12/26/2019 7.4  6.5 - 8.1 g/dL Final  . Albumin 80/99/8338 4.1  3.5 - 5.0 g/dL Final  . AST 25/07/3974 24  15 - 41 U/L Final  . ALT 12/26/2019 28  0 - 44 U/L Final  . Alkaline Phosphatase 12/26/2019 55  38 - 126 U/L Final  . Total Bilirubin 12/26/2019 1.0  0.3 - 1.2 mg/dL Final  . GFR, Estimated 12/26/2019 >60  >60 mL/min Final  . Anion gap 12/26/2019 10  5 - 15 Final   Performed at [facility], [address], [place], [place] [address]  . Hgb A1c MFr Bld 12/26/2019 5.1  4.8 - 5.6 % Final   Comment: (NOTE)         Prediabetes: 5.7 - 6.4         Diabetes: >6.4         Glycemic control for adults with diabetes: <7.0   . Mean Plasma Glucose 12/26/2019 100  mg/dL Final   Comment: (NOTE) Performed At: [facility] [address] [place], [place] [id number] Jolene Schimke MD DZ:[id number]   . Cholesterol 12/26/2019 150  0 - 200 mg/dL Final  . Triglycerides 12/26/2019 46  <150 mg/dL Final  . HDL 41/96/2229 57  >40 mg/dL Final  . Total CHOL/HDL Ratio 12/26/2019 2.6  RATIO Final  . VLDL 12/26/2019 9  0 - 40 mg/dL Final  . LDL Cholesterol 12/26/2019 84  0 - 99 mg/dL Final   Comment:        Total Cholesterol/HDL:CHD Risk Coronary Heart Disease Risk Table                     Men   Women  1/2 Average Risk   3.4   3.3  Average Risk       5.0   4.4  2 X Average Risk   9.6   7.1  3 X Average Risk  23.4   11.0        Use the calculated Patient Ratio above and the CHD Risk  Table to determine the patient's CHD Risk.        ATP III CLASSIFICATION (LDL):  <100     mg/dL   Optimal  798-921  mg/dL   Near or Above                    Optimal  130-159  mg/dL   Borderline  194-174  mg/dL   High  >081     mg/dL   Very High Performed at [facility], [address], [place], [place] [address]   . TSH 12/26/2019 0.869  0.350 - 4.500 uIU/mL Final   Comment: Performed by a 3rd Generation assay with a functional sensitivity of <=0.01 uIU/mL. Performed at [facility], [address] [place], [place] [address]   . POC Amphetamine UR 12/26/2019 None Detected  None Detected Final  . POC Secobarbital (BAR) 12/26/2019 None Detected  None Detected Final  . POC Buprenorphine (BUP) 12/26/2019 None Detected  None Detected Final  . POC Oxazepam (BZO) 12/26/2019 None Detected  None Detected Final  . POC Cocaine UR 12/26/2019 None Detected  None Detected Final  . POC Methamphetamine UR 12/26/2019 None Detected  None Detected Final  . POC Morphine 12/26/2019 None Detected  None Detected Final  . POC Oxycodone UR 12/26/2019 None Detected  None Detected Final  . POC Methadone UR 12/26/2019 None Detected  None Detected Final  . POC Marijuana UR 12/26/2019 Positive* None Detected Final  . Preg Test, Ur 12/26/2019 NEGATIVE  NEGATIVE Final   Comment:        THE SENSITIVITY OF THIS METHODOLOGY IS >24 mIU/mL   . SARS Coronavirus 2 Ag 12/26/2019 Negative  Negative Preliminary    Allergies: Red dye and Zyprexa [olanzapine]  PTA Medications: (Not in a hospital admission)   Medical Decision Making   Recommendations  Based on my evaluation the patient does not appear to have an emergency medical condition. The patient will remain under observation overnight and reassess in the A.M. to determine if she meets the criteria for psychiatric inpatient admission or can be discharged.  Gillermo Murdoch, NP 01/22/20  4:46 AM
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
## 2020-01-22 NOTE — ED Notes (Signed)
Pt sitting up on bed eating bkft. Denies concerns. Denies AS/HI/AVH. Limited interaction noted. Quiet. Pt observed picking skin off bottom of feet numerous times. Educated on proper hand hygiene as pt was observed picking skin off feet and then return to eating bkft without sanitizing her hands. Pt complied. Cooperative with staff. Received am medication without difficulty. Informed pt to notify staff with any needs or concerns. Safety maintained.

## 2020-01-22 NOTE — ED Provider Notes (Signed)
FBC/OBS ASAP Discharge Summary  Date and Time: 01/22/2020 10:33 AM  Name: Betty Parrish  MRN:  [id number]   Discharge Diagnoses:  Final diagnoses:  None    Evaluation:  Glennis Brink observed resting in bed.patient was recommended for overnight observation. she is awake, alert and oriented x3.  Denying suicidal or homicidal ideations.  Denies auditory and visual hallucinations.  Reports struggling with depression and states she needs to be restarted on her medication.  Patient reports she lost her prescription for her Abilify.  NP E prescribed Ab[facility] to [facility] at [employer].  She reports she is going to follow-up with [facility] for medication management.  States previous diagnosis with schizoaffective and her primary care provider [facility] has been prescribing her medications.  Patient is requesting to be discharged.  Discussed following up at [facility] for walk-in appointment.  Patient was receptive to plan.  Per admission assessment note:Betty Parrish is a 29 year old female who presents voluntarily and unaccompanied to [facility] for her suicidal ideation.The patient was seen three weeks ago and was given a prescription for Abilify 10 mg daily.  The patient disclosed she misplaced it at someone's home. Therefore, she never got the medication. She is here asking for the prescription and requesting an ACT-Team. The patient does not appear to be responding to internal or external stimuli. Neither is the patient presenting with any delusional thinking. The patient admits to auditory and visual hallucinations. The patient admits to suicidal ideation but denies homicidal or self-harm ideations. The patient is not presenting with any psychotic or paranoid behaviors. During an encounter with the patient, she was able to answer questions appropriately.   Total Time spent with patient: 15 minutes  Past Psychiatric History:  Past Medical History:  Past Medical History:  Diagnosis Date  . ADHD (attention  deficit hyperactivity disorder)   . Anxiety   . Heart murmur   . Herpes   . Schizo-affective psychosis (HCC)   . Sickle cell trait (HCC)    No past surgical history on file. Family History:  Family History  Problem Relation Age of Onset  . Sickle cell anemia Mother    Family Psychiatric History:  Social History:  Social History   Substance and Sexual Activity  Alcohol Use No     Social History   Substance and Sexual Activity  Drug Use Yes  . Types: Marijuana, Cocaine   Comment: last used marijuana yesterday, denies cocaine use    Social History   Socioeconomic History  . Marital status: Significant Other    Spouse name: Not on file  . Number of children: Not on file  . Years of education: Not on file  . Highest education level: Not on file  Occupational History  . Not on file  Tobacco Use  . Smoking status: Current Every Day Smoker    Packs/day: 1.00    Types: Cigarettes  . Smokeless tobacco: Never Used  Vaping Use  . Vaping Use: Never used  Substance and Sexual Activity  . Alcohol use: No  . Drug use: Yes    Types: Marijuana, Cocaine    Comment: last used marijuana yesterday, denies cocaine use  . Sexual activity: Yes    Birth control/protection: I.U.D.  Other Topics Concern  . Not on file  Social History Narrative  . Not on file   Social Determinants of Health   Financial Resource Strain:   . Difficulty of Paying Living Expenses: Not on file  Food Insecurity:   . Worried  About Running Out of Food in the Last Year: Not on file  . Ran Out of Food in the Last Year: Not on file  Transportation Needs:   . Lack of Transportation (Medical): Not on file  . Lack of Transportation (Non-Medical): Not on file  Physical Activity:   . Days of Exercise per Week: Not on file  . Minutes of Exercise per Session: Not on file  Stress:   . Feeling of Stress : Not on file  Social Connections:   . Frequency of Communication with Friends and Family: Not on file  .  Frequency of Social Gatherings with Friends and Family: Not on file  . Attends Religious Services: Not on file  . Active Member of Clubs or Organizations: Not on file  . Attends Banker Meetings: Not on file  . Marital Status: Not on file   SDOH:  SDOH Screenings   Alcohol Screen: Low Risk   . Last Alcohol Screening Score (AUDIT): 0  Depression (PHQ2-9):   . PHQ-2 Score: Not on file  Financial Resource Strain:   . Difficulty of Paying Living Expenses: Not on file  Food Insecurity:   . Worried About Programme researcher, broadcasting/film/video in the Last Year: Not on file  . Ran Out of Food in the Last Year: Not on file  Housing:   . Last Housing Risk Score: Not on file  Physical Activity:   . Days of Exercise per Week: Not on file  . Minutes of Exercise per Session: Not on file  Social Connections:   . Frequency of Communication with Friends and Family: Not on file  . Frequency of Social Gatherings with Friends and Family: Not on file  . Attends Religious Services: Not on file  . Active Member of Clubs or Organizations: Not on file  . Attends Banker Meetings: Not on file  . Marital Status: Not on file  Stress:   . Feeling of Stress : Not on file  Tobacco Use: High Risk  . Smoking Tobacco Use: Current Every Day Smoker  . Smokeless Tobacco Use: Never Used  Transportation Needs:   . Freight forwarder (Medical): Not on file  . Lack of Transportation (Non-Medical): Not on file    Has this patient used any form of tobacco in the last 30 days? (Cigarettes, Smokeless Tobacco, Cigars, and/or Pipes) A prescription for an FDA-approved tobacco cessation medication was offered at discharge and the patient refused  Current Medications:  Current Facility-Administered Medications  Medication Dose Route Frequency Provider Last Rate Last Admin  . acetaminophen (TYLENOL) tablet 650 mg  650 mg Oral Q6H PRN Gillermo Murdoch, NP      . alum & mag hydroxide-simeth (MAALOX/MYLANTA)  200-200-20 MG/5ML suspension 30 mL  30 mL Oral Q4H PRN Gillermo Murdoch, NP      . ARIPiprazole (ABILIFY) tablet 10 mg  10 mg Oral Daily Gillermo Murdoch, NP   10 mg at 01/22/20 0904  . magnesium hydroxide (MILK OF MAGNESIA) suspension 30 mL  30 mL Oral Daily PRN Gillermo Murdoch, NP       Current Outpatient Medications  Medication Sig Dispense Refill  . ARIPiprazole (ABILIFY) 10 MG tablet Take 1 tablet (10 mg total) by mouth daily. 30 tablet 0    PTA Medications: (Not in a hospital admission)   Musculoskeletal  Strength & Muscle Tone: within normal limits Gait & Station: normal Patient leans: N/A  Psychiatric Specialty Exam  Presentation  General Appearance: Appropriate for Environment  Eye Contact:Good  Speech:Clear and Coherent  Speech Volume:Normal  Handedness:Right   Mood and Affect  Mood:Anxious;Irritable  Affect:Congruent   Thought Process  Thought Processes:Coherent  Descriptions of Associations:Intact  Orientation:Full (Time, Place and Person)  Thought Content:Logical  Hallucinations:Hallucinations: None Description of Auditory Hallucinations: To shoot herself  Ideas of Reference:None  Suicidal Thoughts:Suicidal Thoughts: No SI Active Intent and/or Plan: Without Intent;With Plan  Homicidal Thoughts:Homicidal Thoughts: No   Sensorium  Memory:Recent Good;Immediate Good  Judgment:Fair  Insight:Fair   Executive Functions  Concentration:Fair  Attention Span:Good  Recall:Fair  Fund of Knowledge:Fair  Language:Fair   Psychomotor Activity  Psychomotor Activity:Psychomotor Activity: Normal   Assets  Assets:Communication Skills;Social Support   Sleep  Sleep:Sleep: Fair   Physical Exam  Physical Exam Vitals reviewed.  Psychiatric:        Mood and Affect: Mood normal.    Review of Systems  Psychiatric/Behavioral: Positive for depression. Negative for suicidal ideas. The patient is not nervous/anxious.   All  other systems reviewed and are negative.  Blood pressure 134/80, pulse 72, temperature 97.9 F (36.6 C), temperature source Oral, resp. rate 18, height 5' 8.5" (1.74 m), weight 213 lb (96.6 kg), SpO2 100 %. Body mass index is 31.91 kg/m.  Demographic Factors:  Low socioeconomic status and Unemployed  Loss Factors: Financial problems/change in socioeconomic status  Historical Factors: NA  Risk Reduction Factors:   Positive social support and Positive therapeutic relationship  Continued Clinical Symptoms:  Depression:   Hopelessness Impulsivity  Cognitive Features That Contribute To Risk:  Closed-mindedness    Suicide Risk:  Minimal: No identifiable suicidal ideation.  Patients presenting with no risk factors but with morbid ruminations; may be classified as minimal risk based on the severity of the depressive symptoms  Plan Of Care/Follow-up recommendations:  Activity:  as tolerated Diet:  heart healthy  Disposition: Take all medications as prescribed. Keep all follow-up appointments as scheduled.  Do not consume alcohol or use illegal drugs while on prescription medications. Report any adverse effects from your medications to your primary care provider promptly.  In the event of recurrent symptoms or worsening symptoms, call 911, a crisis hotline, or go to the nearest emergency department for evaluation.   Oneta Rack, NP 01/22/2020, 10:33 AM

## 2020-01-23 ENCOUNTER — Telehealth (HOSPITAL_COMMUNITY): Payer: Self-pay | Admitting: Family Medicine

## 2020-01-23 ENCOUNTER — Ambulatory Visit (HOSPITAL_COMMUNITY)
Admission: EM | Admit: 2020-01-23 | Discharge: 2020-01-23 | Disposition: A | Discharge status: Home / Self Care | Payer: Medicaid Other

## 2020-01-23 LAB — PROLACTIN: Prolactin: 5.7 ng/mL (ref 4.8–23.3)

## 2020-01-23 NOTE — Telephone Encounter (Signed)
Care Management - Follow Up [facility] Discharges   Writer left a voice mail message for the patient.  Writer left a HIPA compliant voice mail informing the patient that she is able to come to [facility] without an appointment Mon thru Thurs from 8am to [employer] stressed that these appointments are first come - first serve in order to see a psychiatrist for an initial appointment.   Also Thursday from 8a to 3pm and Friday from 1pm to 4pm is [facility] and the patient is able to come without an appointment for therapy.   Writer left name and phone number for the patent to call back if further assistance is needed.
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
## 2020-01-24 ENCOUNTER — Telehealth (HOSPITAL_COMMUNITY): Payer: Self-pay | Admitting: Family Medicine

## 2020-01-24 NOTE — Telephone Encounter (Signed)
Care Management - Follow Up [facility] Discharges   Writer left a HIPA compliant voice mail informing the patient that she is able to come to [facility] without an appointment Mon thru Thurs from 8am to 11am.    Writer stressed that these appointments are first come - first serve in order to see a psychiatrist for an initial appointment.   Also Thursday from 8a to 3pm and Friday from 1pm to 4pm is [facility] and the patient is able to come without an appointment for therapy.   Writer left name and phone number for the patent to call back if further assistance is needed

## 2020-03-18 ENCOUNTER — Other Ambulatory Visit: Payer: Self-pay

## 2020-03-18 ENCOUNTER — Encounter (HOSPITAL_COMMUNITY): Payer: Self-pay

## 2020-03-18 DIAGNOSIS — Z760 Encounter for issue of repeat prescription: Secondary | ICD-10-CM | POA: Diagnosis not present

## 2020-03-18 DIAGNOSIS — Z5321 Procedure and treatment not carried out due to patient leaving prior to being seen by health care provider: Secondary | ICD-10-CM | POA: Insufficient documentation

## 2020-03-18 NOTE — ED Triage Notes (Signed)
Pt requesting a refill of abilify. Last dose 2 days ago. Pt also requesting std check, and covid test.

## 2020-03-19 ENCOUNTER — Emergency Department (HOSPITAL_COMMUNITY)
Admission: EM | Admit: 2020-03-19 | Discharge: 2020-03-19 | Disposition: A | Discharge status: Home / Self Care | Payer: Medicaid Other | Attending: Emergency Medicine | Admitting: Emergency Medicine

## 2020-03-19 ENCOUNTER — Other Ambulatory Visit (HOSPITAL_COMMUNITY): Payer: Self-pay | Admitting: Family

## 2020-03-19 NOTE — ED Notes (Signed)
Called 3x for room placement. Eloped from waiting area.  

## 2020-04-01 ENCOUNTER — Encounter (HOSPITAL_COMMUNITY): Payer: Self-pay

## 2020-04-01 ENCOUNTER — Other Ambulatory Visit: Payer: Self-pay

## 2020-04-01 ENCOUNTER — Ambulatory Visit (HOSPITAL_COMMUNITY)
Admission: EM | Admit: 2020-04-01 | Discharge: 2020-04-02 | Disposition: A | Discharge status: Home / Self Care | Payer: Medicaid Other | Attending: Student | Admitting: Student

## 2020-04-01 DIAGNOSIS — F17210 Nicotine dependence, cigarettes, uncomplicated: Secondary | ICD-10-CM | POA: Insufficient documentation

## 2020-04-01 DIAGNOSIS — F909 Attention-deficit hyperactivity disorder, unspecified type: Secondary | ICD-10-CM | POA: Insufficient documentation

## 2020-04-01 DIAGNOSIS — F419 Anxiety disorder, unspecified: Secondary | ICD-10-CM | POA: Insufficient documentation

## 2020-04-01 DIAGNOSIS — F250 Schizoaffective disorder, bipolar type: Secondary | ICD-10-CM | POA: Insufficient documentation

## 2020-04-01 DIAGNOSIS — Z20822 Contact with and (suspected) exposure to covid-19: Secondary | ICD-10-CM | POA: Insufficient documentation

## 2020-04-01 LAB — POC SARS CORONAVIRUS 2 AG -  ED: SARS Coronavirus 2 Ag: NEGATIVE

## 2020-04-01 MED ORDER — ALUM & MAG HYDROXIDE-SIMETH 200-200-20 MG/5ML PO SUSP: 30.0000 mL | ORAL | Status: DC | PRN

## 2020-04-01 MED ORDER — MAGNESIUM HYDROXIDE 400 MG/5ML PO SUSP: 30.0000 mL | Freq: Every day | ORAL | Status: DC | PRN

## 2020-04-01 MED ORDER — HYDROXYZINE HCL 25 MG PO TABS
25.0000 mg | ORAL_TABLET | Freq: Three times a day (TID) | ORAL | Status: DC | PRN
  Filled 2020-04-01: qty 10

## 2020-04-01 MED ORDER — ACETAMINOPHEN 325 MG PO TABS: 650.0000 mg | ORAL_TABLET | Freq: Four times a day (QID) | ORAL | Status: DC | PRN

## 2020-04-01 MED ORDER — TRAZODONE HCL 50 MG PO TABS
50.0000 mg | ORAL_TABLET | Freq: Every evening | ORAL | Status: DC | PRN
  Filled 2020-04-01: qty 7

## 2020-04-01 NOTE — ED Triage Notes (Signed)
Patient arrives via [name] as voluntary walk in. When asked why she came in states " I can't explain it but I'm depressed. I miss my daughter." Dones not answer questions regarding suicidality, homicidality, or hallucinations. Patient answers most of triage questions with grunts or 'I don't know'. In no acute distress at this time
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
## 2020-04-01 NOTE — ED Provider Notes (Signed)
Behavioral Health Admission [facility]&P [facility])  Date: 04/01/20 Patient Name: Betty Parrish MRN: [id number] Chief Complaint:  Chief Complaint  Patient presents with  . Depression      Diagnoses:  Final diagnoses:  Schizoaffective disorder, bipolar type (HCC)    HPI: Betty Parrish is a 30 year old female with a history of schizoaffective disorder (bipolar type), ADHD, anxiety, and cannabis abuse who presents to the behavioral health urgent care via [name] as a voluntary walk-in for feelings of depression.  Patient states that "the way I feel was not good, just do not feel happy.  I am upset, angry, and depressed".  Patient states that she has "been feeling this way for a very long time since last year.)  Patient denies SI currently on exam, but endorses having SI recently in which she "broke apart a razor" and had plans to cut herself, but states she ultimately did not carry out this plan (Patient unable to state when this occurred).  She endorses multiple past suicide attempts which included trying to hang herself with her sweatpants at 30 years old, cutting herself (patient does not remember when this occurred), and trying to overdose on pills "a long time ago in my teens".  Patient endorses self harming herself via cutting in her teenage years, but states that she has not done this recently.  Patient denies HI, but endorses aggressive feelings toward an old roommate (patient unable to explain this further).  Patient also endorses auditory hallucinations and visual hallucinations, but when asked to describe the AVH further, the patient is unable to describe the hallucinations further.  Patient endorses feeling paranoid, feeling like people are out to get her, and that "a grown woman chased me away from her home with a gun".  Patient reports poor sleep, stating that she does not sleep at all.  She denies anhedonia, but endorses feelings of guilt and hopelessness "always".  Patient reports that she has no energy  and that her concentration is so low that she "does not want to think".  Patient states that she has "no appetite" and that she denies any recent weight changes.  Patient states that she had an ACT team since the age of 8 and reports that the ACT team was administering monthly Abilify injections to her as well as prescribing her Abilify p.o 10 mg PO.  Patient states that she was discharged from her ACT team "a long time ago".  Patient states that she has not been taking her Abilify since about last year.  Later in the encounter, patient states that she is unsure of she ever took Abilify pills or not. Patient reports that she has had multiple inpatient psychiatric admissions in the past at different facilities such as [facility] and [facility], but she states that she does not know the last time that she had inpatient psychiatric treatment.  Per chart review, patient last presented to the behavioral health urgent care on January 22, 2020 for SI and was kept for behavioral health urgent care continuous observations/assessment at that time.  Patient was then discharged from the behavioral health urgent care after reassessment on January 22, 2020.  Patient states that she is not currently seeing a psychiatrist or a therapist and that she has seen psychiatry and therapy in the past but cannot remember when that was.   Patient endorses to taking "random pills" that she thought might have been ecstasy, but patient is unable to recall when she last took these  pills.  Patient also endorses using marijuana "on and off" since the age of 30 years old and cannot recall when she last smoked marijuana.  Patient also endorses using cocaine last year but has not used since.  Patient also reports that she does not drink alcohol or use any additional substances.  Patient states that she is currently homeless because her mother kicked her out of her home after she was evicted from her own residence in  March 2021.  Patient denies any access to weapons, but states that her mother "sent a box of firearms to my cell phone and I screen shotted a picture of it and send it to the police in [place]"..  Patient states she is unemployed.   On exam, patient is sitting in a chair, tearful at times, but in no acute distress.  Patient smells strongly of marijuana.  Her stated mood is upset, angry, and depressed with congruent affect.  She is alert and oriented to her name but not to date.  Unable to assess patient's orientation to location.  Patient yells at times while she is talking during the encounter.  Patient does not appear to be responding to internal stimuli.  PHQ 2-9:   Flowsheet Row ED from 01/22/2020 in [facility] ED from 12/26/2019 in [facility] Admission (Discharged) from 03/26/2019 in BEHAVIORAL HEALTH OBSERVATION UNIT  C-SSRS RISK CATEGORY Low Risk Low Risk Low Risk       Total Time spent with patient: 30 minutes  Musculoskeletal  Strength & Muscle Tone: within normal limits Gait & Station: normal Patient leans: N/A  Psychiatric Specialty Exam  Presentation General Appearance: Bizarre; Disheveled  Eye Contact:Fleeting; Poor  Speech:Garbled  Speech Volume:Increased  Handedness:Right   Mood and Affect  Mood:Depressed (Upset, Angry)  Affect:Congruent   Thought Process  Thought Processes:Disorganized  Descriptions of Associations:Circumstantial  Orientation:-- (Patient oriented to her name, but not oriented to date. Unable to assess patient's orientation to location.)  Thought Content:Paranoid Ideation; Delusions; Scattered  Hallucinations:Hallucinations: Auditory; Visual Description of Auditory Hallucinations: Patient unable to describe. Description of Visual Hallucinations: Patient unable to describe.  Ideas of Reference:Paranoia; Delusions  Suicidal Thoughts:Suicidal Thoughts: -- (Patient denies SI  currently on exam, but endorses having SI recently in which she "broke apart a razor" and had plans to cut herself, but states she ultimately did not carry out this plan (Patient unable to state when this occured).)  Homicidal Thoughts:Homicidal Thoughts: No (Denies HI, but endorses aggressive feelings toward an old roommate)   Sensorium  Memory:Immediate Fair; Recent Fair; Remote Fair  Judgment:Fair  Insight:Lacking   Executive Functions  Concentration:Fair  Attention Span:Fair  Recall:Fair  [employer] of Knowledge:Fair  Language:Fair   Psychomotor Activity  Psychomotor Activity:Psychomotor Activity: Normal   Assets  Assets:Communication Skills; Desire for Improvement; Financial Resources/Insurance; Leisure Time; Physical Health   Sleep  Sleep:Sleep: Poor   Physical Exam Vitals reviewed.  Constitutional:      General: She is not in acute distress.    Appearance: She is not ill-appearing, toxic-appearing or diaphoretic.     Comments: Patient smells strongly of marijuana and is tearful at times.  HENT:     Head: Normocephalic and atraumatic.     Right Ear: External ear normal.     Left Ear: External ear normal.  Cardiovascular:     Rate and Rhythm: Normal rate.  Pulmonary:     Effort: Pulmonary effort is normal. No respiratory distress.  Musculoskeletal:  General: Normal range of motion.     Cervical back: Normal range of motion.  Neurological:     Mental Status: She is alert.     Comments: Patient oriented to her name, but not oriented to date. Unable to assess patient's orientation to location.  Psychiatric:        Attention and Perception: Attention normal. She perceives auditory and visual hallucinations.        Behavior: Behavior is not agitated, slowed, aggressive, withdrawn, hyperactive or combative. Behavior is cooperative.        Thought Content: Thought content is paranoid.     Comments: Her stated mood is upset, angry, and depressed with  congruent affect. Speech is garbled. Patient denies SI currently on exam, but endorses having SI recently in which she "broke apart a razor" and had plans to cut herself, but states she ultimately did not carry out this plan (Patient unable to state when this occurred). Patient denies HI, but endorses aggressive feelings toward an old roommate (patient unable to explain this further). Patient also endorses auditory hallucinations and visual hallucinations, but when asked to describe the AVH further, the patient is unable to describe the hallucinations further. Judgement fair and insight lacking.    Review of Systems  Constitutional: Positive for malaise/fatigue. Negative for chills, diaphoresis, fever and weight loss.  HENT: Negative for congestion.   Respiratory: Negative for cough and shortness of breath.   Cardiovascular: Negative for chest pain and palpitations.  Gastrointestinal: Negative for abdominal pain, constipation, diarrhea, nausea and vomiting.  Musculoskeletal: Negative for joint pain and myalgias.  Neurological: Negative for dizziness, seizures, loss of consciousness and headaches.  Psychiatric/Behavioral: Positive for depression, hallucinations, substance abuse and suicidal ideas. Negative for memory loss. The patient has insomnia.   All other systems reviewed and are negative.   Vitals: Blood pressure 133/79, pulse 83, temperature (!) 97.5 F (36.4 C), temperature source Tympanic, resp. rate 18, SpO2 98 %. There is no height or weight on file to calculate BMI.  Past Psychiatric History: Schizoaffective Disorder (Bipolar Type), Bipolar Disorder, Cannabis Abuse, Mania   Is the patient at risk to self? Yes  Has the patient been a risk to self in the past 6 months? Yes .    Has the patient been a risk to self within the distant past? No   Is the patient a risk to others? Yes   Has the patient been a risk to others in the past 6 months? No   Has the patient been a risk to others  within the distant past? No   Past Medical History:  Past Medical History:  Diagnosis Date  . ADHD (attention deficit hyperactivity disorder)   . Anxiety   . Heart murmur   . Herpes   . Schizo-affective psychosis (HCC)   . Sickle cell trait (HCC)    History reviewed. No pertinent surgical history.  Family History:  Family History  Problem Relation Age of Onset  . Sickle cell anemia Mother     Social History:  Social History   Socioeconomic History  . Marital status: Significant Other    Spouse name: Not on file  . Number of children: Not on file  . Years of education: Not on file  . Highest education level: Not on file  Occupational History  . Not on file  Tobacco Use  . Smoking status: Current Every Day Smoker    Packs/day: 1.00    Types: Cigarettes  . Smokeless tobacco: Never Used  Vaping Use  . Vaping Use: Never used  Substance and Sexual Activity  . Alcohol use: No  . Drug use: Yes    Types: Marijuana, Cocaine    Comment: last used marijuana yesterday, denies cocaine use  . Sexual activity: Yes    Birth control/protection: I.U.D.  Other Topics Concern  . Not on file  Social History Narrative  . Not on file   Social Determinants of Health   Financial Resource Strain: Not on file  Food Insecurity: Not on file  Transportation Needs: Not on file  Physical Activity: Not on file  Stress: Not on file  Social Connections: Not on file  Intimate Partner Violence: Not on file    SDOH:  SDOH Screenings   Alcohol Screen: Not on file  Depression (GNF6-2): Not on file  Financial Resource Strain: Not on file  Food Insecurity: Not on file  Housing: Not on file  Physical Activity: Not on file  Social Connections: Not on file  Stress: Not on file  Tobacco Use: High Risk  . Smoking Tobacco Use: Current Every Day Smoker  . Smokeless Tobacco Use: Never Used  Transportation Needs: Not on file    Last Labs:  Admission on 01/22/2020, Discharged on 01/22/2020   Component Date Value Ref Range Status  . SARS Coronavirus 2 by RT PCR 01/22/2020 NEGATIVE  NEGATIVE Final   Comment: (NOTE) SARS-CoV-2 target nucleic acids are NOT DETECTED.  The SARS-CoV-2 RNA is generally detectable in upper respiratoy specimens during the acute phase of infection. The lowest concentration of SARS-CoV-2 viral copies this assay can detect is 131 copies/mL. A negative result does not preclude SARS-Cov-2 infection and should not be used as the sole basis for treatment or other patient management decisions. A negative result may occur with  improper specimen collection/handling, submission of specimen other than nasopharyngeal swab, presence of viral mutation(s) within the areas targeted by this assay, and inadequate number of viral copies (<131 copies/mL). A negative result must be combined with clinical observations, patient history, and epidemiological information. The expected result is Negative.  Fact Sheet for Patients:  https://www.moore.com/  Fact Sheet for Healthcare Providers:  https://www.young.biz/  This test is no                          t yet approved or cleared by the [place] FDA and  has been authorized for detection and/or diagnosis of SARS-CoV-2 by FDA under an Emergency Use Authorization (EUA). This EUA will remain  in effect (meaning this test can be used) for the duration of the COVID-19 declaration under Section 564(b)(1) of the Act, 21 U.S.C. section 360bbb-3(b)(1), unless the authorization is terminated or revoked sooner.    . Influenza A by PCR 01/22/2020 NEGATIVE  NEGATIVE Final  . Influenza B by PCR 01/22/2020 NEGATIVE  NEGATIVE Final   Comment: (NOTE) The Xpert Xpress SARS-CoV-2/FLU/RSV assay is intended as an aid in  the diagnosis of influenza from Nasopharyngeal swab specimens and  should not be used as a sole basis for treatment. Nasal washings and  aspirates are unacceptable for Xpert  Xpress SARS-CoV-2/FLU/RSV  testing.  Fact Sheet for Patients: https://www.moore.com/  Fact Sheet for Healthcare Providers: https://www.young.biz/  This test is not yet approved or cleared by the [place] FDA and  has been authorized for detection and/or diagnosis of SARS-CoV-2 by  FDA under an Emergency Use Authorization (EUA). This EUA will remain  in effect (meaning this test can be  used) for the duration of the  Covid-19 declaration under Section 564(b)(1) of the Act, 21  U.S.C. section 360bbb-3(b)(1), unless the authorization is  terminated or revoked. Performed at [facility], [address], [place], [place] [address]   . SARS Coronavirus 2 Ag 01/22/2020 Negative  Negative Preliminary  . WBC 01/22/2020 6.5  4.0 - 10.5 K/uL Final  . RBC 01/22/2020 4.69  3.87 - 5.11 MIL/uL Final  . Hemoglobin 01/22/2020 14.8  12.0 - 15.0 g/dL Final  . HCT 47/82/9562 43.5  36.0 - 46.0 % Final  . MCV 01/22/2020 92.8  80.0 - 100.0 fL Final  . MCH 01/22/2020 31.6  26.0 - 34.0 pg Final  . MCHC 01/22/2020 34.0  30.0 - 36.0 g/dL Final  . RDW 13/10/6576 13.0  11.5 - 15.5 % Final  . Platelets 01/22/2020 178  150 - 400 K/uL Final  . nRBC 01/22/2020 0.0  0.0 - 0.2 % Final  . Neutrophils Relative % 01/22/2020 42  % Final  . Neutro Abs 01/22/2020 2.8  1.7 - 7.7 K/uL Final  . Lymphocytes Relative 01/22/2020 40  % Final  . Lymphs Abs 01/22/2020 2.6  0.7 - 4.0 K/uL Final  . Monocytes Relative 01/22/2020 13  % Final  . Monocytes Absolute 01/22/2020 0.8  0.1 - 1.0 K/uL Final  . Eosinophils Relative 01/22/2020 4  % Final  . Eosinophils Absolute 01/22/2020 0.3  0.0 - 0.5 K/uL Final  . Basophils Relative 01/22/2020 1  % Final  . Basophils Absolute 01/22/2020 0.1  0.0 - 0.1 K/uL Final  . Immature Granulocytes 01/22/2020 0  % Final  . Abs Immature Granulocytes 01/22/2020 0.01  0.00 - 0.07 K/uL Final   Performed at [facility], [address],  [place], [place] [address]  . Sodium 01/22/2020 137  135 - 145 mmol/L Final  . Potassium 01/22/2020 3.4* 3.5 - 5.1 mmol/L Final  . Chloride 01/22/2020 102  98 - 111 mmol/L Final  . CO2 01/22/2020 23  22 - 32 mmol/L Final  . Glucose, Bld 01/22/2020 85  70 - 99 mg/dL Final   Glucose reference range applies only to samples taken after fasting for at least 8 hours.  . BUN 01/22/2020 7  6 - 20 mg/dL Final  . Creatinine, Ser 01/22/2020 0.77  0.44 - 1.00 mg/dL Final  . Calcium 95/28/4132 9.6  8.9 - 10.3 mg/dL Final  . Total Protein 01/22/2020 7.3  6.5 - 8.1 g/dL Final  . Albumin 44/03/270 4.3  3.5 - 5.0 g/dL Final  . AST 53/66/4403 33  15 - 41 U/L Final  . ALT 01/22/2020 37  0 - 44 U/L Final  . Alkaline Phosphatase 01/22/2020 51  38 - 126 U/L Final  . Total Bilirubin 01/22/2020 1.2  0.3 - 1.2 mg/dL Final  . GFR, Estimated 01/22/2020 >60  >60 mL/min Final   Comment: (NOTE) Calculated using the CKD-EPI Creatinine Equation (2021)   . Anion gap 01/22/2020 12  5 - 15 Final   Performed at [facility], [address], [place], [place] [address]  . Hgb A1c MFr Bld 01/22/2020 5.2  4.8 - 5.6 % Final   Comment: (NOTE) Pre diabetes:          5.7%-6.4%  Diabetes:              >6.4%  Glycemic control for   <7.0% adults with diabetes   . Mean Plasma Glucose 01/22/2020 102.54  mg/dL Final   Performed  at [facility], [address] [place], [place] [address]  . Magnesium 01/22/2020 2.2  1.7 - 2.4 mg/dL Final   Performed at [facility], [address] [place], [place] [address]  . Alcohol, Ethyl (B) 01/22/2020 <10  <10 mg/dL Final   Comment: (NOTE) Lowest detectable limit for serum alcohol is 10 mg/dL.  For medical purposes only. Performed at [facility], [address] [place], [place] [address]   . TSH 01/22/2020 0.621  0.350 - 4.500 uIU/mL Final   Comment: Performed by a 3rd Generation assay with a functional sensitivity of <=0.01 uIU/mL. Performed at [facility], [address] [place], [place] [address]   . Prolactin 01/22/2020 5.7  4.8 - 23.3 ng/mL Final   Comment: (NOTE) Performed At: [facility] [address] [place], [place] [id number] Jolene SchimkeNagendra Sanjai MD UX:[id number]:[phone]   . POC Amphetamine UR 01/22/2020 None Detected  None Detected Final  . POC Secobarbital (BAR) 01/22/2020 None Detected  None Detected Final  . POC Buprenorphine (BUP) 01/22/2020 None Detected  None Detected Final  . POC Oxazepam (BZO) 01/22/2020 None Detected  None Detected Final  . POC Cocaine UR 01/22/2020 None Detected  None Detected Final  . POC Methamphetamine UR 01/22/2020 None Detected  None Detected Final  . POC Morphine 01/22/2020 None Detected  None Detected Final  . POC Oxycodone UR 01/22/2020 None Detected  None Detected Final  . POC Methadone UR 01/22/2020 None Detected  None Detected Final  . POC Marijuana UR 01/22/2020 Positive* None Detected Final  . Preg Test, Ur 01/22/2020 NEGATIVE  NEGATIVE Final   Comment:        THE SENSITIVITY OF THIS METHODOLOGY IS >24 mIU/mL   . Glucose-Capillary 01/22/2020 85  70 - 99 mg/dL Final   Glucose reference range applies only to samples taken after fasting for at least 8 hours.  . Cholesterol 01/22/2020 136  0 - 200 mg/dL Final  . Triglycerides 01/22/2020 48  <150 mg/dL Final  . HDL 53/66/440311/14/2021 57  >40 mg/dL Final  . Total CHOL/HDL Ratio 01/22/2020 2.4  RATIO Final  . VLDL 01/22/2020 10  0 - 40 mg/dL Final  . LDL Cholesterol 01/22/2020 69  0 - 99 mg/dL Final   Comment:        Total Cholesterol/HDL:CHD Risk Coronary Heart Disease Risk Table                     Men   Women  1/2 Average Risk   3.4   3.3  Average Risk       5.0   4.4  2 X Average Risk   9.6   7.1  3 X Average Risk  23.4   11.0        Use the calculated Patient Ratio above and the CHD Risk Table to determine the patient's CHD Risk.        ATP III CLASSIFICATION (LDL):  <100     mg/dL   Optimal  [phone]  mg/dL   Near or Above                     Optimal  130-159  mg/dL   Borderline  [phone]  mg/dL   High  >643>190     mg/dL   Very High Performed at [facility], [address] [place], [place] [address]   Admission on 12/26/2019, Discharged on 12/27/2019  Component Date  Value Ref Range Status  . SARS Coronavirus 2 by RT PCR 12/26/2019 NEGATIVE  NEGATIVE Final   Comment: (NOTE) SARS-CoV-2 target nucleic acids are NOT DETECTED.  The SARS-CoV-2 RNA is generally detectable in upper respiratoy specimens during the acute phase of infection. The lowest concentration of SARS-CoV-2 viral copies this assay can detect is 131 copies/mL. A negative result does not preclude SARS-Cov-2 infection and should not be used as the sole basis for treatment or other patient management decisions. A negative result may occur with  improper specimen collection/handling, submission of specimen other than nasopharyngeal swab, presence of viral mutation(s) within the areas targeted by this assay, and inadequate number of viral copies (<131 copies/mL). A negative result must be combined with clinical observations, patient history, and epidemiological information. The expected result is Negative.  Fact Sheet for Patients:  https://www.moore.com/https://www.fda.gov/media/142436/download  Fact Sheet for Healthcare Providers:  https://www.young.biz/https://www.fda.gov/media/142435/download  This test is no                          t yet approved or cleared by the [place] FDA and  has been authorized for detection and/or diagnosis of SARS-CoV-2 by FDA under an Emergency Use Authorization (EUA). This EUA will remain  in effect (meaning this test can be used) for the duration of the COVID-19 declaration under Section 564(b)(1) of the Act, 21 U.S.C. section 360bbb-3(b)(1), unless the authorization is terminated or revoked sooner.    . Influenza A by PCR 12/26/2019 NEGATIVE  NEGATIVE Final  . Influenza B by PCR 12/26/2019 NEGATIVE  NEGATIVE Final   Comment: (NOTE) The  Xpert Xpress SARS-CoV-2/FLU/RSV assay is intended as an aid in  the diagnosis of influenza from Nasopharyngeal swab specimens and  should not be used as a sole basis for treatment. Nasal washings and  aspirates are unacceptable for Xpert Xpress SARS-CoV-2/FLU/RSV  testing.  Fact Sheet for Patients: https://www.moore.com/https://www.fda.gov/media/142436/download  Fact Sheet for Healthcare Providers: https://www.young.biz/https://www.fda.gov/media/142435/download  This test is not yet approved or cleared by the [place] FDA and  has been authorized for detection and/or diagnosis of SARS-CoV-2 by  FDA under an Emergency Use Authorization (EUA). This EUA will remain  in effect (meaning this test can be used) for the duration of the  Covid-19 declaration under Section 564(b)(1) of the Act, 21  U.S.C. section 360bbb-3(b)(1), unless the authorization is  terminated or revoked. Performed at [facility], [address] [place], [place] [address]   . WBC 12/26/2019 8.3  4.0 - 10.5 K/uL Final  . RBC 12/26/2019 4.52  3.87 - 5.11 MIL/uL Final  . Hemoglobin 12/26/2019 14.2  12.0 - 15.0 g/dL Final  . HCT 60/45/409810/18/2021 41.4  36.0 - 46.0 % Final  . MCV 12/26/2019 91.6  80.0 - 100.0 fL Final  . MCH 12/26/2019 31.4  26.0 - 34.0 pg Final  . MCHC 12/26/2019 34.3  30.0 - 36.0 g/dL Final  . RDW 11/91/478210/18/2021 12.8  11.5 - 15.5 % Final  . Platelets 12/26/2019 PLATELET CLUMPS NOTED ON SMEAR, UNABLE TO ESTIMATE  150 - 400 K/uL Final   Comment: REPEATED TO VERIFY SPECIMEN CHECKED FOR CLOTS   . nRBC 12/26/2019 0.0  0.0 - 0.2 % Final  . Neutrophils Relative % 12/26/2019 52  % Final  . Neutro Abs 12/26/2019 4.3  1.7 - 7.7 K/uL Final  . Lymphocytes Relative 12/26/2019 34  % Final  . Lymphs Abs 12/26/2019 2.8  0.7 - 4.0 K/uL Final  . Monocytes Relative  12/26/2019 8  % Final  . Monocytes Absolute 12/26/2019 0.7  0.1 - 1.0 K/uL Final  . Eosinophils Relative 12/26/2019 5  % Final  . Eosinophils Absolute 12/26/2019 0.4  0.0 - 0.5 K/uL Final  .  Basophils Relative 12/26/2019 1  % Final  . Basophils Absolute 12/26/2019 0.0  0.0 - 0.1 K/uL Final  . Immature Granulocytes 12/26/2019 0  % Final  . Abs Immature Granulocytes 12/26/2019 0.01  0.00 - 0.07 K/uL Final   Performed at [facility], [address] [place], [place] [address]  . Sodium 12/26/2019 138  135 - 145 mmol/L Final  . Potassium 12/26/2019 3.6  3.5 - 5.1 mmol/L Final  . Chloride 12/26/2019 102  98 - 111 mmol/L Final  . CO2 12/26/2019 26  22 - 32 mmol/L Final  . Glucose, Bld 12/26/2019 93  70 - 99 mg/dL Final   Glucose reference range applies only to samples taken after fasting for at least 8 hours.  . BUN 12/26/2019 6  6 - 20 mg/dL Final  . Creatinine, Ser 12/26/2019 0.79  0.44 - 1.00 mg/dL Final  . Calcium 54/00/8676 9.6  8.9 - 10.3 mg/dL Final  . Total Protein 12/26/2019 7.4  6.5 - 8.1 g/dL Final  . Albumin 19/50/9326 4.1  3.5 - 5.0 g/dL Final  . AST 71/24/5809 24  15 - 41 U/L Final  . ALT 12/26/2019 28  0 - 44 U/L Final  . Alkaline Phosphatase 12/26/2019 55  38 - 126 U/L Final  . Total Bilirubin 12/26/2019 1.0  0.3 - 1.2 mg/dL Final  . GFR, Estimated 12/26/2019 >60  >60 mL/min Final  . Anion gap 12/26/2019 10  5 - 15 Final   Performed at [facility], [address] [place], [place] [address]  . Hgb A1c MFr Bld 12/26/2019 5.1  4.8 - 5.6 % Final   Comment: (NOTE)         Prediabetes: 5.7 - 6.4         Diabetes: >6.4         Glycemic control for adults with diabetes: <7.0   . Mean Plasma Glucose 12/26/2019 100  mg/dL Final   Comment: (NOTE) Performed At: [facility] [address] [place], [place] [id number] Jolene Schimke MD HA:[id number]   . Cholesterol 12/26/2019 150  0 - 200 mg/dL Final  . Triglycerides 12/26/2019 46  <150 mg/dL Final  . HDL 73/53/2992 57  >40 mg/dL Final  . Total CHOL/HDL Ratio 12/26/2019 2.6  RATIO Final  . VLDL 12/26/2019 9  0 - 40 mg/dL Final  . LDL Cholesterol 12/26/2019 84  0 - 99 mg/dL Final   Comment:         Total Cholesterol/HDL:CHD Risk Coronary Heart Disease Risk Table                     Men   Women  1/2 Average Risk   3.4   3.3  Average Risk       5.0   4.4  2 X Average Risk   9.6   7.1  3 X Average Risk  23.4   11.0        Use the calculated Patient Ratio above and the CHD Risk Table to determine the patient's CHD Risk.        ATP III CLASSIFICATION (LDL):  <100     mg/dL   Optimal  426-834  mg/dL   Near or Above  Optimal  130-159  mg/dL   Borderline  161-096  mg/dL   High  >045     mg/dL   Very High Performed at [facility], [address] [place], [place] [address]   . TSH 12/26/2019 0.869  0.350 - 4.500 uIU/mL Final   Comment: Performed by a 3rd Generation assay with a functional sensitivity of <=0.01 uIU/mL. Performed at [facility], [address] [place], [place] [address]   . POC Amphetamine UR 12/26/2019 None Detected  None Detected Final  . POC Secobarbital (BAR) 12/26/2019 None Detected  None Detected Final  . POC Buprenorphine (BUP) 12/26/2019 None Detected  None Detected Final  . POC Oxazepam (BZO) 12/26/2019 None Detected  None Detected Final  . POC Cocaine UR 12/26/2019 None Detected  None Detected Final  . POC Methamphetamine UR 12/26/2019 None Detected  None Detected Final  . POC Morphine 12/26/2019 None Detected  None Detected Final  . POC Oxycodone UR 12/26/2019 None Detected  None Detected Final  . POC Methadone UR 12/26/2019 None Detected  None Detected Final  . POC Marijuana UR 12/26/2019 Positive* None Detected Final  . Preg Test, Ur 12/26/2019 NEGATIVE  NEGATIVE Final   Comment:        THE SENSITIVITY OF THIS METHODOLOGY IS >24 mIU/mL   . SARS Coronavirus 2 Ag 12/26/2019 Negative  Negative Preliminary    Allergies: Red dye and Zyprexa [olanzapine]  PTA Medications: (Not in a hospital admission)   Medical Decision Making  Patient is a 30 year old female with a history of schizoaffective disorder (bipolar  type) who presents to the behavioral health urgent care voluntarily.  Patient endorses recent SI, aggressive feelings towards others, AVH and paranoia.  She appears to be exhibiting psychotic features at this time.    Recommendations  Based on my evaluation the patient does not appear to have an emergency medical condition.  Will admit the patient to [facility] continuous observation/assessment for further stabilization and treatment.  Patient will be reevaluated on April 02, 2020 and disposition to be determined at that time. Labs ordered and reviewed:  -Urine pregnancy: Negative  -UDS: Positive for marijuana  -CBC with differential and CMP: Results pending  -TSH, lipid panel, and hemoglobin A1c ordered due to patient's history of antipsychotic medication: Results pending  -EKG ordered to assess patient's QTC due to patient's history of antipsychotic medication: EKG shows no acute/concerning findings with a QTC of 423 ms (acceptable QTC to reinitiate antipsychotic medication). Patient states that she has a serious allergy to red dye #5.  Discussed the potential of restarting the patient's Abilify.  After further questioning, patient was unable to state if she has taken Abilify pills in the past.  After further discussion with the patient, ultimately decided not to reinitiate patient's Abilify tonight due to potential for adverse reaction to potential red dye in the Abilify.  Jaclyn Shaggy, PA-C 04/01/20  10:50 PM

## 2020-04-02 ENCOUNTER — Telehealth (HOSPITAL_COMMUNITY): Payer: Self-pay | Admitting: Family Medicine

## 2020-04-02 LAB — COMPREHENSIVE METABOLIC PANEL
ALT: 48 U/L — ABNORMAL HIGH (ref 0–44)
AST: 29 U/L (ref 15–41)
Albumin: 3.9 g/dL (ref 3.5–5.0)
Alkaline Phosphatase: 52 U/L (ref 38–126)
Anion gap: 11 (ref 5–15)
BUN: <5 mg/dL — ABNORMAL LOW (ref 6–20)
CO2: 25 mmol/L (ref 22–32)
Calcium: 9.5 mg/dL (ref 8.9–10.3)
Chloride: 102 mmol/L (ref 98–111)
Creatinine, Ser: 0.69 mg/dL (ref 0.44–1.00)
GFR, Estimated: >60 mL/min (ref >60)
Glucose, Bld: 124 mg/dL — ABNORMAL HIGH (ref 70–99)
Potassium: 3.4 mmol/L — ABNORMAL LOW (ref 3.5–5.1)
Sodium: 138 mmol/L (ref 135–145)
Total Bilirubin: 0.8 mg/dL (ref 0.3–1.2)
Total Protein: 7.2 g/dL (ref 6.5–8.1)

## 2020-04-02 LAB — LIPID PANEL
Cholesterol: 126 mg/dL (ref 0–200)
HDL: 47 mg/dL (ref >40)
LDL Cholesterol: 69 mg/dL (ref 0–99)
Total CHOL/HDL Ratio: 2.7 RATIO
Triglycerides: 49 mg/dL (ref <150)
VLDL: 10 mg/dL (ref 0–40)

## 2020-04-02 LAB — CBC WITH DIFFERENTIAL/PLATELET
Abs Immature Granulocytes: 0.02 10*3/uL (ref 0.00–0.07)
Basophils Absolute: 0 10*3/uL (ref 0.0–0.1)
Basophils Relative: 1 %
Eosinophils Absolute: 0.3 10*3/uL (ref 0.0–0.5)
Eosinophils Relative: 4 %
HCT: 41.6 % (ref 36.0–46.0)
Hemoglobin: 14.4 g/dL (ref 12.0–15.0)
Immature Granulocytes: 0 %
Lymphocytes Relative: 37 %
Lymphs Abs: 2.6 10*3/uL (ref 0.7–4.0)
MCH: 32.1 pg (ref 26.0–34.0)
MCHC: 34.6 g/dL (ref 30.0–36.0)
MCV: 92.7 fL (ref 80.0–100.0)
Monocytes Absolute: 0.7 10*3/uL (ref 0.1–1.0)
Monocytes Relative: 11 %
Neutro Abs: 3.3 10*3/uL (ref 1.7–7.7)
Neutrophils Relative %: 47 %
Platelets: 207 10*3/uL (ref 150–400)
RBC: 4.49 MIL/uL (ref 3.87–5.11)
RDW: 12.4 % (ref 11.5–15.5)
WBC: 7 10*3/uL (ref 4.0–10.5)
nRBC: 0 % (ref 0.0–0.2)

## 2020-04-02 LAB — POCT URINE DRUG SCREEN - MANUAL ENTRY (I-SCREEN)
POC Amphetamine UR: NOT DETECTED
POC Buprenorphine (BUP): NOT DETECTED
POC Cocaine UR: NOT DETECTED
POC Marijuana UR: POSITIVE — AB
POC Methadone UR: NOT DETECTED
POC Methamphetamine UR: NOT DETECTED
POC Morphine: NOT DETECTED
POC Oxazepam (BZO): NOT DETECTED
POC Oxycodone UR: NOT DETECTED
POC Secobarbital (BAR): NOT DETECTED

## 2020-04-02 LAB — RESP PANEL BY RT-PCR (FLU A&B, COVID) ARPGX2
Influenza A by PCR: NEGATIVE
Influenza B by PCR: NEGATIVE
SARS Coronavirus 2 by RT PCR: NEGATIVE

## 2020-04-02 LAB — HEMOGLOBIN A1C
Hgb A1c MFr Bld: 5.5 % (ref 4.8–5.6)
Mean Plasma Glucose: 111.15 mg/dL

## 2020-04-02 LAB — POCT PREGNANCY, URINE: Preg Test, Ur: NEGATIVE

## 2020-04-02 LAB — TSH: TSH: 0.732 u[IU]/mL (ref 0.350–4.500)

## 2020-04-02 MED ORDER — ARIPIPRAZOLE 10 MG PO TABS: 10.0000 mg | ORAL_TABLET | Freq: Every day | ORAL | 0 refills | Status: DC

## 2020-04-02 MED ORDER — POTASSIUM CHLORIDE CRYS ER 20 MEQ PO TBCR
40.0000 meq | EXTENDED_RELEASE_TABLET | Freq: Once | ORAL | Status: AC
  Administered 2020-04-02: 40 meq via ORAL
  Filled 2020-04-02: qty 2

## 2020-04-02 MED ORDER — TRAZODONE HCL 50 MG PO TABS: 50.0000 mg | ORAL_TABLET | Freq: Every evening | ORAL | 0 refills | Status: DC | PRN

## 2020-04-02 MED ORDER — HYDROXYZINE HCL 25 MG PO TABS: 25.0000 mg | ORAL_TABLET | Freq: Three times a day (TID) | ORAL | 0 refills | Status: DC | PRN

## 2020-04-02 MED ORDER — ARIPIPRAZOLE 10 MG PO TABS
10.0000 mg | ORAL_TABLET | Freq: Every day | ORAL | Status: DC
  Administered 2020-04-02: 10 mg via ORAL
  Filled 2020-04-02: qty 7
  Filled 2020-04-02: qty 1

## 2020-04-02 NOTE — ED Provider Notes (Signed)
[facility]/OBS ASAP Discharge Summary  Date and Time: 04/02/2020 10:46 AM  Name: Betty Parrish  MRN:  [id number]   Discharge Diagnoses:  Final diagnoses:  Schizoaffective disorder, bipolar type (HCC)    Subjective: Patient reports that she is feeling much better today.  She reports that she had plan we will continue her Abilify 10 mg p.o. daily however she was told that the medication has been sent to her pharmacy 3 different times and every time she went there to pick it up they never had it.  She states that is why she did not take it any longer.  She denies having any suicidal or homicidal ideations and denies any hallucinations today.  Patient is requesting discharge to go stay with a friend because she has issues with staying with her mother.  She states that is part of the reason she came in.  She reports that she wants to continue her medications and is requesting a printed prescription this time so that she can take it to the pharmacy herself.  Stay Summary: Patient is a 30 year old female with a history of schizoaffective disorder, ADHD, and anxiety that presented to the [facility] via [facility] voluntarily as a walk-in reporting feelings of depression.  Patient had originally discussed having suicidal ideations.  She reports that she has had depression for a long time and chronic suicidal ideations with a history of suicide attempts.  Patient was admitted to the continuous observation unit for overnight assessment.  This morning patient was restarted on her Abilify 10 mg p.o. daily as she has tolerated well in the past.  Patient reported that the biggest reason she came in was to get restarted on medications because she had not gotten them filled because the pharmacy Told her they did not have the prescription.  Patient is requesting discharge and a printed prescription as well as samples of her medications.  Patient was provided with 7-day samples as well as 30-day prescriptions of her medications printed.   Patient was also provided with information for open access at the [facility].  Patient had continued to deny any suicidal homicidal ideations and denied any hallucinations.  Total Time spent with patient: 30 minutes  Past Psychiatric History: Schizoaffective, ADHD, MDD Past Medical History:  Past Medical History:  Diagnosis Date  . ADHD (attention deficit hyperactivity disorder)   . Anxiety   . Heart murmur   . Herpes   . Schizo-affective psychosis (HCC)   . Sickle cell trait (HCC)    History reviewed. No pertinent surgical history. Family History:  Family History  Problem Relation Age of Onset  . Sickle cell anemia Mother    Family Psychiatric History: None reported Social History:  Social History   Substance and Sexual Activity  Alcohol Use No     Social History   Substance and Sexual Activity  Drug Use Yes  . Types: Marijuana, Cocaine   Comment: last used marijuana yesterday, denies cocaine use    Social History   Socioeconomic History  . Marital status: Significant Other    Spouse name: Not on file  . Number of children: Not on file  . Years of education: Not on file  . Highest education level: Not on file  Occupational History  . Not on file  Tobacco Use  . Smoking status: Current Every Day Smoker    Packs/day: 1.00    Types: Cigarettes  . Smokeless tobacco: Never Used  Vaping Use  . Vaping Use: Never used  Substance and Sexual Activity  . Alcohol use: No  . Drug use: Yes    Types: Marijuana, Cocaine    Comment: last used marijuana yesterday, denies cocaine use  . Sexual activity: Yes    Birth control/protection: I.U.D.  Other Topics Concern  . Not on file  Social History Narrative  . Not on file   Social Determinants of Health   Financial Resource Strain: Not on file  Food Insecurity: Not on file  Transportation Needs: Not on file  Physical Activity: Not on file  Stress: Not on file  Social Connections: Not on file   SDOH:  SDOH Screenings    Alcohol Screen: Not on file  Depression (WCH8-5): Not on file  Financial Resource Strain: Not on file  Food Insecurity: Not on file  Housing: Not on file  Physical Activity: Not on file  Social Connections: Not on file  Stress: Not on file  Tobacco Use: High Risk  . Smoking Tobacco Use: Current Every Day Smoker  . Smokeless Tobacco Use: Never Used  Transportation Needs: Not on file    Has this patient used any form of tobacco in the last 30 days? (Cigarettes, Smokeless Tobacco, Cigars, and/or Pipes) A prescription for an FDA-approved tobacco cessation medication was offered at discharge and the patient refused  Current Medications:  Current Facility-Administered Medications  Medication Dose Route Frequency Provider Last Rate Last Admin  . acetaminophen (TYLENOL) tablet 650 mg  650 mg Oral Q6H PRN Jaclyn Shaggy, PA-C      . alum & mag hydroxide-simeth (MAALOX/MYLANTA) 200-200-20 MG/5ML suspension 30 mL  30 mL Oral Q4H PRN Melbourne Abts W, PA-C      . ARIPiprazole (ABILIFY) tablet 10 mg  10 mg Oral Daily Cimberly Stoffel, Gerlene Burdock, FNP   10 mg at 04/02/20 2778  . hydrOXYzine (ATARAX/VISTARIL) tablet 25 mg  25 mg Oral TID PRN Jaclyn Shaggy, PA-C      . magnesium hydroxide (MILK OF MAGNESIA) suspension 30 mL  30 mL Oral Daily PRN Melbourne Abts W, PA-C      . traZODone (DESYREL) tablet 50 mg  50 mg Oral QHS PRN Jaclyn Shaggy, PA-C       Current Outpatient Medications  Medication Sig Dispense Refill  . ARIPiprazole (ABILIFY) 10 MG tablet Take 1 tablet (10 mg total) by mouth daily. 30 tablet 0  . hydrOXYzine (ATARAX/VISTARIL) 25 MG tablet Take 1 tablet (25 mg total) by mouth 3 (three) times daily as needed for anxiety. 30 tablet 0  . traZODone (DESYREL) 50 MG tablet Take 1 tablet (50 mg total) by mouth at bedtime as needed for sleep. 30 tablet 0    PTA Medications: (Not in a hospital admission)   Musculoskeletal  Strength & Muscle Tone: within normal limits Gait & Station: normal Patient  leans: N/A  Psychiatric Specialty Exam  Presentation  General Appearance: Appropriate for Environment; Disheveled  Eye Contact:Good  Speech:Clear and Coherent; Normal Rate  Speech Volume:Normal  Handedness:Right   Mood and Affect  Mood:Euthymic  Affect:Congruent; Appropriate   Thought Process  Thought Processes:Coherent  Descriptions of Associations:Intact  Orientation:Full (Time, Place and Person)  Thought Content:WDL  Hallucinations:Hallucinations: None Description of Auditory Hallucinations: Patient unable to describe. Description of Visual Hallucinations: Patient unable to describe.  Ideas of Reference:None  Suicidal Thoughts:Suicidal Thoughts: No  Homicidal Thoughts:Homicidal Thoughts: No   Sensorium  Memory:Immediate Good; Recent Good; Remote Good  Judgment:Fair  Insight:Fair   Executive Functions  Concentration:Fair  Attention Span:Fair  Recall:Good  Fund of Knowledge:Fair  Language:Good   Psychomotor Activity  Psychomotor Activity:Psychomotor Activity: Normal   Assets  Assets:Communication Skills; Desire for Improvement; Financial Resources/Insurance; Housing; Social Support; Physical Health   Sleep  Sleep:Sleep: Good   Physical Exam  Physical Exam Vitals and nursing note reviewed.  Constitutional:      Appearance: She is well-developed.  HENT:     Head: Normocephalic.  Eyes:     Pupils: Pupils are equal, round, and reactive to light.  Cardiovascular:     Rate and Rhythm: Normal rate.  Pulmonary:     Effort: Pulmonary effort is normal.  Musculoskeletal:        General: Normal range of motion.  Neurological:     Mental Status: She is alert and oriented to person, place, and time.    Review of Systems  Constitutional: Negative.   HENT: Negative.   Eyes: Negative.   Respiratory: Negative.   Cardiovascular: Negative.   Gastrointestinal: Negative.   Genitourinary: Negative.   Musculoskeletal: Negative.   Skin:  Negative.   Neurological: Negative.   Endo/Heme/Allergies: Negative.   Psychiatric/Behavioral: Negative.    Blood pressure 127/78, pulse 69, temperature 98.4 F (36.9 C), temperature source Oral, resp. rate 18, SpO2 100 %. There is no height or weight on file to calculate BMI.  Demographic Factors:  Low socioeconomic status  Loss Factors: NA  Historical Factors: NA  Risk Reduction Factors:   Living with another person, especially a relative and Positive social support  Continued Clinical Symptoms:  Previous Psychiatric Diagnoses and Treatments  Cognitive Features That Contribute To Risk:  None    Suicide Risk:  Mild:  Suicidal ideation of limited frequency, intensity, duration, and specificity.  There are no identifiable plans, no associated intent, mild dysphoria and related symptoms, good self-control (both objective and subjective assessment), few other risk factors, and identifiable protective factors, including available and accessible social support.  Plan Of Care/Follow-up recommendations:  Continue activity as tolerated. Continue diet as recommended by your PCP. Ensure to keep all appointments with outpatient providers.  Disposition: Discharge home  Maryfrances Bunnell, FNP 04/02/2020, 10:46 AM

## 2020-04-02 NOTE — Telephone Encounter (Signed)
Care Management - Follow Up [facility] Discharges   Writer attempted to make contact with patient today and was unsuccessful.  Writer was able to leave a HIPPA compliant voice message and will await callback.   

## 2020-04-02 NOTE — ED Notes (Signed)
Discharge instructions provided and Pt stated understanding. Prescriptions and samples provided prior to d/c. Personal belongings returned and Pt escorted to the [facility] for transportation services. Pt alert, orient and ambulatory. Safety maintained.

## 2020-04-02 NOTE — Discharge Instructions (Addendum)

## 2020-04-02 NOTE — ED Notes (Signed)
Patient discharged.

## 2020-04-06 ENCOUNTER — Other Ambulatory Visit: Payer: Self-pay

## 2020-04-06 ENCOUNTER — Encounter (HOSPITAL_COMMUNITY): Payer: Self-pay

## 2020-04-06 ENCOUNTER — Ambulatory Visit (HOSPITAL_COMMUNITY)
Admission: EM | Admit: 2020-04-06 | Discharge: 2020-04-06 | Disposition: A | Discharge status: Home / Self Care | Payer: Medicaid Other | Attending: Psychiatry | Admitting: Psychiatry

## 2020-04-06 DIAGNOSIS — R45851 Suicidal ideations: Secondary | ICD-10-CM

## 2020-04-06 DIAGNOSIS — F1290 Cannabis use, unspecified, uncomplicated: Secondary | ICD-10-CM | POA: Diagnosis not present

## 2020-04-06 DIAGNOSIS — F250 Schizoaffective disorder, bipolar type: Secondary | ICD-10-CM

## 2020-04-06 DIAGNOSIS — Z20822 Contact with and (suspected) exposure to covid-19: Secondary | ICD-10-CM | POA: Diagnosis not present

## 2020-04-06 DIAGNOSIS — F1490 Cocaine use, unspecified, uncomplicated: Secondary | ICD-10-CM | POA: Insufficient documentation

## 2020-04-06 DIAGNOSIS — F1099 Alcohol use, unspecified with unspecified alcohol-induced disorder: Secondary | ICD-10-CM | POA: Insufficient documentation

## 2020-04-06 DIAGNOSIS — F17210 Nicotine dependence, cigarettes, uncomplicated: Secondary | ICD-10-CM | POA: Insufficient documentation

## 2020-04-06 LAB — CBC WITH DIFFERENTIAL/PLATELET
Abs Immature Granulocytes: 0.01 10*3/uL (ref 0.00–0.07)
Basophils Absolute: 0 10*3/uL (ref 0.0–0.1)
Basophils Relative: 1 %
Eosinophils Absolute: 0.3 10*3/uL (ref 0.0–0.5)
Eosinophils Relative: 6 %
HCT: 37.4 % (ref 36.0–46.0)
Hemoglobin: 13.4 g/dL (ref 12.0–15.0)
Immature Granulocytes: 0 %
Lymphocytes Relative: 42 %
Lymphs Abs: 2.3 10*3/uL (ref 0.7–4.0)
MCH: 32.9 pg (ref 26.0–34.0)
MCHC: 35.8 g/dL (ref 30.0–36.0)
MCV: 91.9 fL (ref 80.0–100.0)
Monocytes Absolute: 0.5 10*3/uL (ref 0.1–1.0)
Monocytes Relative: 10 %
Neutro Abs: 2.2 10*3/uL (ref 1.7–7.7)
Neutrophils Relative %: 41 %
Platelets: UNDETERMINED 10*3/uL (ref 150–400)
RBC: 4.07 MIL/uL (ref 3.87–5.11)
RDW: 12.8 % (ref 11.5–15.5)
WBC: 5.5 10*3/uL (ref 4.0–10.5)
nRBC: 0 % (ref 0.0–0.2)

## 2020-04-06 LAB — COMPREHENSIVE METABOLIC PANEL WITH GFR
ALT: 30 U/L (ref 0–44)
AST: 19 U/L (ref 15–41)
Albumin: 3.5 g/dL (ref 3.5–5.0)
Alkaline Phosphatase: 59 U/L (ref 38–126)
Anion gap: 9 (ref 5–15)
BUN: 11 mg/dL (ref 6–20)
CO2: 25 mmol/L (ref 22–32)
Calcium: 9.1 mg/dL (ref 8.9–10.3)
Chloride: 103 mmol/L (ref 98–111)
Creatinine, Ser: 0.81 mg/dL (ref 0.44–1.00)
GFR, Estimated: >60 mL/min
Glucose, Bld: 93 mg/dL (ref 70–99)
Potassium: 3.7 mmol/L (ref 3.5–5.1)
Sodium: 137 mmol/L (ref 135–145)
Total Bilirubin: 0.7 mg/dL (ref 0.3–1.2)
Total Protein: 6.3 g/dL — ABNORMAL LOW (ref 6.5–8.1)

## 2020-04-06 LAB — RESP PANEL BY RT-PCR (FLU A&B, COVID) ARPGX2
Influenza A by PCR: NEGATIVE
Influenza B by PCR: NEGATIVE
SARS Coronavirus 2 by RT PCR: NEGATIVE

## 2020-04-06 LAB — POC SARS CORONAVIRUS 2 AG -  ED: SARS Coronavirus 2 Ag: NEGATIVE

## 2020-04-06 LAB — ETHANOL: Alcohol, Ethyl (B): <10 mg/dL

## 2020-04-06 MED ORDER — ACETAMINOPHEN 325 MG PO TABS: 650.0000 mg | ORAL_TABLET | Freq: Four times a day (QID) | ORAL | Status: DC | PRN

## 2020-04-06 MED ORDER — ARIPIPRAZOLE 10 MG PO TABS: 10.0000 mg | ORAL_TABLET | Freq: Every day | ORAL | 0 refills | Status: DC

## 2020-04-06 MED ORDER — ALUM & MAG HYDROXIDE-SIMETH 200-200-20 MG/5ML PO SUSP: 30.0000 mL | ORAL | Status: DC | PRN

## 2020-04-06 MED ORDER — HYDROXYZINE HCL 25 MG PO TABS: 25.0000 mg | ORAL_TABLET | Freq: Three times a day (TID) | ORAL | Status: DC | PRN

## 2020-04-06 MED ORDER — MAGNESIUM HYDROXIDE 400 MG/5ML PO SUSP: 30.0000 mL | Freq: Every day | ORAL | Status: DC | PRN

## 2020-04-06 MED ORDER — TRAZODONE HCL 50 MG PO TABS: 50.0000 mg | ORAL_TABLET | Freq: Every evening | ORAL | Status: DC | PRN

## 2020-04-06 MED ORDER — ARIPIPRAZOLE 10 MG PO TABS
10.0000 mg | ORAL_TABLET | Freq: Every day | ORAL | Status: DC
  Filled 2020-04-06: qty 1
  Filled 2020-04-06: qty 7

## 2020-04-06 NOTE — ED Triage Notes (Addendum)
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 Patient endorses SI, initially says she slit her wrist earlier today and when asked to see it states well it's a scratch, I don't know how I got it. Patient endorses homicidal ideation toward 'my baby daddy' because 'he's refusing to let me see my kid and violating a court order'. Patient endorses seeing shadows and hearing voices that aren't saying anything specific.  Patient unaware of date, eating sandwich, calm & cooperative. Patient endorses homelessness, staying with friend since last [facility] visit but he wasn't home.  Patient states she has not gotten prescription filled from last visit.

## 2020-04-06 NOTE — BH Assessment (Signed)
Comprehensive Clinical Assessment (CCA) Note  04/06/2020 Betty Parrish [id number]  Chief Complaint:  F33.3 Major depressive disorder, Recurrent episode, With psychotic features  Chief Complaint  Patient presents with  . Depression   Visit Diagnosis:  F33.3 Major depressive disorder, Recurrent episode, With psychotic features  Betty Parrish is a 30 years old single patient who presents voluntarily to [facility] accompanied by herself.  Pt reports she has a history of schizophrenic and has been feeling depressed for the past two weeks. Pt reports that she have been worrying, crying, feeling and hopelessness.  Pt states "I am having suicidal thoughts, I am going to take some pills or hang myself".  Pt reports that she is angry at her baby's daddy "I have one son that's ten years old, and he won't let me see him".  Pt reports that she is homeless; also have not been sleeping through the night.  Pt reports that she is eating okay.  Pt reports previous suicidal attempt, by cutting herself.  Pt denies homicidal ideation or history of violence.  Pt reports that she see shadows and hear voices , writer asked if the voices were in the room or  Inside of her head.  Pt states that they are in my head.  Pt says she have been drinking alcohol, smoking crack cocaine, and smoking marijuana on yesterday 04/05/20.  Pt identifies her primary stressor as homeless and not been able to see her son.   Pt started to fall asleep during the consultation, she denied history of abuse, or trauma.  Pt reports that she currently has two court dates scheduled for:  May 07, 2020 for misdemeanor chareged for larceny; also communicating threats charged scheduled for March 2022.  Pt says she is currently not receiving weekly outpatient therapy; also have not been taking her medication.  Pt reports that she wants information on reconnecting to her prior ACTT.  Pt is dressed casual, alert, oriented x 3 with loud speech and restless motor  behavior.  Eye contact is avoided and pt mood irritable and affect is blunt.  Thought process is suspicious.  Pt's insight is poor and her judgement is impaired.  There is no indication Pt is currently responding to internal stimuli or experiencing delusional thought content.  Pt was argumentative  throughout assessment.      Disposition: Cecilio Asper NP, recommends overnight observation and to be reassessed by psychiatry.  Disposition discussed with Forensic psychologist at [facility].   CCA Screening, Triage and Referral (STR)  Patient Reported Information How did you hear about [place]? Legal System (Phreesia 04/06/2020)  Referral name: Officer Escort (Phreesia 04/06/2020)  Referral phone number: [phone] (N/A)   Whom do you see for routine medical problems? I don't have a doctor (Phreesia 04/06/2020)  Practice/Facility Name: [facility]  Practice/Facility Phone Number: [phone] (Unknown)  Name of Contact: No data recorded Contact Number: No data recorded Contact Fax Number: No data recorded Prescriber Name: Various  Prescriber Address (if known): Unknown   What Is the Reason for Your Visit/Call Today? ACCT Team (Phreesia 04/06/2020)  How Long Has This Been Causing You Problems? > than 6 months (Phreesia 04/06/2020)  What Do You Feel Would Help You the Most Today? Therapy (Phreesia 04/06/2020)   Have You Recently Been in Any Inpatient Treatment (Hospital/Detox/Crisis Center/28-Day Program)? Yes (Phreesia 04/06/2020)  Name/Location of Program/Hospital:[facility] (Phreesia 04/06/2020)  How Long Were You There? 24 Hours (Phreesia 04/06/2020)  When Were You Discharged?  (April 2021)   Have You Ever Received  Services From [facility] Before? Yes (Phreesia 04/06/2020)  Who Do You See at [facility]? Dr. Ladona Ridgel (Phreesia 04/06/2020)   Have You Recently Had Any Thoughts About Hurting Yourself? Yes (Phreesia 04/06/2020)  Are You Planning to Commit Suicide/Harm Yourself At This time? Yes (Phreesia  04/06/2020)   Have you Recently Had Thoughts About Hurting Someone Karolee Ohs? Yes (Phreesia 04/06/2020)  Explanation: No data recorded  Have You Used Any Alcohol or Drugs in the Past 24 Hours? Yes (Phreesia 04/06/2020)  How Long Ago Did You Use Drugs or Alcohol? 0000 (Pt is unsure)  What Did You Use and How Much? Cocaine Weed  (Phreesia 04/06/2020)   Do You Currently Have a Therapist/Psychiatrist? No (Phreesia 04/06/2020)  Name of Therapist/Psychiatrist: No data recorded  Have You Been Recently Discharged From Any Office Practice or Programs? Yes (Phreesia 04/06/2020)  Explanation of Discharge From Practice/Program: unknown (Phreesia 04/06/2020)     CCA Screening Triage Referral Assessment Type of Contact: Face-to-Face  Is this Initial or Reassessment? No data recorded Date Telepsych consult ordered in [facility]:  No data recorded Time Telepsych consult ordered in [facility]:  No data recorded  Patient Reported Information Reviewed? Yes  Patient Left Without Being Seen? No data recorded Reason for Not Completing Assessment: No data recorded  Collateral Involvement: Pt declined to provide verbal consent for clinician to contact friends/family members for collateral, stating she has no support.   Does Patient Have a Automotive engineer Guardian? No data recorded Name and Contact of Legal Guardian: No data recorded If Minor and Not Living with Parent(s), Who has Custody? N/A  Is CPS involved or ever been involved? -- (UTA)  Is APS involved or ever been involved? -- (UTA)   Patient Determined To Be At Risk for Harm To Self or Others Based on Review of Patient Reported Information or Presenting Complaint? Yes, for Self-Harm  Method: No data recorded Availability of Means: No data recorded Intent: No data recorded Notification Required: No data recorded Additional Information for Danger to Others Potential: No data recorded Additional Comments for Danger to Others Potential: No data  recorded Are There Guns or Other Weapons in Your Home? No data recorded Types of Guns/Weapons: No data recorded Are These Weapons Safely Secured?                            No data recorded Who Could Verify You Are Able To Have These Secured: No data recorded Do You Have any Outstanding Charges, Pending Court Dates, Parole/Probation? No data recorded Contacted To Inform of Risk of Harm To Self or Others: Other: Comment (Clinician was unable to obtain contact information/consent)   Location of Assessment: [facility]   Does Patient Present under Involuntary Commitment? No  IVC Papers Initial File Date: No data recorded  [place] of Residence: [place] (Pt is currently homeless)   Patient Currently Receiving the Following Services: Not Receiving Services   Determination of Need: Emergent (2 hours)   Options For Referral: [facility]     CCA Biopsychosocial Intake/Chief Complaint:  SI, AVH  Current Symptoms/Problems: Crying, Irritabale, Hopelessness, homeless   Patient Reported Schizophrenia/Schizoaffective Diagnosis in Past: Yes   Strengths: UTA  Preferences: UTA  Abilities: UTA   Type of Services Patient Feels are Needed: medication and reestablished ACT Team   Initial Clinical Notes/Concerns: SI   Mental Health Symptoms Depression:  Change in energy/activity; Difficulty Concentrating; Fatigue; Hopelessness; Increase/decrease in appetite; Sleep (too much  or little)   Duration of Depressive symptoms: Greater than two weeks   Mania:  Change in energy/activity; Irritability; Racing thoughts   Anxiety:   Fatigue; Irritability; Restlessness   Psychosis:  Grossly disorganized or catatonic behavior   Duration of Psychotic symptoms: Less than six months   Trauma:  None   Obsessions:  None   Compulsions:  None   Inattention:  Disorganized; Does not seem to listen; Fails to pay attention/makes careless mistakes   Hyperactivity/Impulsivity:   N/A; Feeling of restlessness   Oppositional/Defiant Behaviors:  None   Emotional Irregularity:  Chronic feelings of emptiness   Other Mood/Personality Symptoms:  No data recorded   Mental Status Exam Appearance and self-care  Stature:  Average   Weight:  Average weight   Clothing:  Neat/clean   Grooming:  Neglected   Cosmetic use:  Excessive   Posture/gait:  Other (Comment) (Pt had her head on the table.)   Motor activity:  Agitated   Sensorium  Attention:  Inattentive   Concentration:  Anxiety interferes   Orientation:  Object; Person   Recall/memory:  Defective in Immediate   Affect and Mood  Affect:  Anxious; Blunted   Mood:  Irritable   Relating  Eye contact:  Avoided (Pt refused to talk, she start to doze off to sleep.)   Facial expression:  Angry   Attitude toward examiner:  Argumentative   Thought and Language  Speech flow: Loud   Thought content:  Suspicious   Preoccupation:  Suicide   Hallucinations:  Visual   Organization:  No data recorded  Company secretary of Knowledge:  Poor   Intelligence:  Below average   Abstraction:  -- (UTA)   Judgement:  Impaired   Reality Testing:  Distorted   Insight:  Poor   Decision Making:  Paralyzed   Social Functioning  Social Maturity:  Irresponsible; Impulsive   Social Judgement:  -- Industrial/product designer)   Stress  Stressors:  Family conflict; Relationship (Pt reports that she is angry at child's father.)   Coping Ability:  -- Industrial/product designer)   Skill Deficits:  Decision making; Self-care; Self-control   Supports:  Friends/Service system     Religion: Religion/Spirituality How Might This Affect Treatment?: UTA  Leisure/Recreation: Leisure / Recreation Do You Have Hobbies?:  (UTA)  Exercise/Diet: Exercise/Diet Do You Exercise?:  (UTA) Have You Gained or Lost A Significant Amount of Weight in the Past Six Months?:  (UTA) Do You Follow a Special Diet?: No (Pt reports that she have not been  eating) Do You Have Any Trouble Sleeping?:  (Pt reports her sleeping is fine.)   CCA Employment/Education Employment/Work Situation: Employment / Work Situation Employment situation: Unemployed Patient's job has been impacted by current illness:  (UTA) What is the longest time patient has a held a job?: UTA Where was the patient employed at that time?: UTA Has patient ever been in the [employer]?: Yes (Describe in comment) Industrial/product designer)  Education: Education Is Patient Currently Attending School?: No Last Grade Completed:  (UTA) Name of High School: UTA Did Garment/textile technologist From [employer]?:  (UTA) Did You Attend College?:  (UTA) Did You Attend Graduate School?: No Did You Have Any Special Interests In School?: UTA Did You Have An Individualized Education Program (IIEP):  (UTA) Did You Have Any Difficulty At School?:  (UTA) Patient's Education Has Been Impacted by Current Illness:  (UTA)   CCA Family/Childhood History Family and Relationship History: Family history Are you sexually active?:  (UTA) What is  your sexual orientation?: UTA Has your sexual activity been affected by drugs, alcohol, medication, or emotional stress?: UTA Does patient have children?: Yes How many children?: 1 How is patient's relationship with their children?: Child does not live with her  Childhood History:  Childhood History By whom was/is the patient raised?: Mother Additional childhood history information: UTA Description of patient's relationship with caregiver when they were a child: UTA Patient's description of current relationship with people who raised him/her: UTA How were you disciplined when you got in trouble as a child/adolescent?: UTA Does patient have siblings?:  (UTA) Did patient suffer any verbal/emotional/physical/sexual abuse as a child?:  (UTA) Did patient suffer from severe childhood neglect?:  (UTA) Has patient ever been sexually abused/assaulted/raped as an adolescent or adult?:   (UTA) Was the patient ever a victim of a crime or a disaster?: No Patient description of being a victim of a crime or disaster: UTA Witnessed domestic violence?:  (UTA) Has patient been affected by domestic violence as an adult?:  Industrial/product designer)  Child/Adolescent Assessment:     CCA Substance Use Alcohol/Drug Use: Alcohol / Drug Use Pain Medications: See MRA Prescriptions: See MRA Over the Counter: See MRA History of alcohol / drug use?: Yes Substance #1 Name of Substance 1: Alcohol 1 - Age of First Use: UTA 1 - Amount (size/oz): UTA 1 - Frequency: daily 1 - Duration: ongoing 1 - Last Use / Amount: 04/05/20 Substance #2 Name of Substance 2: Crack Cocain 2 - Age of First Use: UTA 2 - Amount (size/oz): UTA 2 - Frequency: UTA 2 - Duration: UTA 2 - Last Use / Amount: 04/05/20 Substance #3 Name of Substance 3: Marijuana 3 - Age of First Use: UTA 3 - Amount (size/oz): UTA 3 - Frequency: UTA 3 - Duration: UTA 3 - Last Use / Amount: 04/06/20                   ASAM's:  Six Dimensions of Multidimensional Assessment  Dimension 1:  Acute Intoxication and/or Withdrawal Potential:      Dimension 2:  Biomedical Conditions and Complications:      Dimension 3:  Emotional, Behavioral, or Cognitive Conditions and Complications:     Dimension 4:  Readiness to Change:     Dimension 5:  Relapse, Continued use, or Continued Problem Potential:     Dimension 6:  Recovery/Living Environment:     ASAM Severity Score:    ASAM Recommended Level of Treatment:     Substance use Disorder (SUD)    Recommendations for Services/Supports/Treatments:    DSM5 Diagnoses: Patient Active Problem List   Diagnosis Date Noted  . Schizoaffective disorder (HCC) 03/26/2019  . Cannabis abuse   . Bipolar disorder, manic phase (HCC) 09/30/2014  . Schizoaffective disorder, bipolar type (HCC) 09/29/2014  . Mania (HCC) 12/18/2012       Referrals to Alternative Service(s): Referred to Alternative  Service(s):   Place:   Date:   Time:    Referred to Alternative Service(s):   Place:   Date:   Time:    Referred to Alternative Service(s):   Place:   Date:   Time:    Referred to Alternative Service(s):   Place:   Date:   Time:     Meryle Ready, Counselor

## 2020-04-06 NOTE — ED Notes (Signed)
Lunch provided: sandwich, chips, and soda

## 2020-04-06 NOTE — ED Provider Notes (Signed)
Behavioral Health Admission [facility]&P [facility])  Date: 04/06/20 Patient Name: Betty Parrish MRN: [id number] Chief Complaint:  Chief Complaint  Patient presents with  . Depression   Chief Complaint/Presenting Problem: SI, AVH  Diagnoses:  Final diagnoses:  Schizoaffective disorder, bipolar type (HCC)  Suicide ideation    HPI: Patient is 60yrs/o female. She Presented voluntarily via [name]. Patient presented with complaint of suicidal ideation. Patient is uncooperative with interview. She refused to open her eyes and occasionally will wake up and say a few words and return to sleep. Patient stated "I try to cut myself today." patient also stated "I plan to take some pills." Patient verbalized to [facility], RN that her stressor was her baby daddy refusing to let her see her kid despite a court order. Patient also informed Jonelle, RN that she want to reactivate her ACT team.  Per Willia Craze RN, patient endorses visual and auditory hallucinations; states patient endorses seeing shadows and hearing voices. Patient informed [place], TTS that she recently did crack cocaine, drinks alcohol daily, and marijuana.   Patient continues to refused to answer questions from this writer and to participate in interview. She remind asleep despites several prompts to wake up. Patient is unable to contract for safety.   PHQ 2-9:  Flowsheet Row ED from 04/06/2020 in [facility]  Thoughts that you would be better off dead, or of hurting yourself in some way Several days  [Phreesia 04/06/2020]  PHQ-9 Total Score 12      Flowsheet Row ED from 04/06/2020 in [facility] ED from 04/01/2020 in [facility] ED from 01/22/2020 in [facility]  C-SSRS RISK CATEGORY High Risk No Risk Low Risk       Total Time spent with patient: 30 minutes  Musculoskeletal  Strength & Muscle Tone: within normal limits Gait & Station:  normal Patient leans: N/A  Psychiatric Specialty Exam  Presentation General Appearance: Appropriate for Environment  Eye Contact:None  Speech:Clear and Coherent  Speech Volume:Normal  Handedness:Right   Mood and Affect  Mood:-- (unable to assess, patient is uncooperative)  Affect:-- (unable to assess, patient is uncooperative)   Thought Process  Thought Processes:-- (unable to assess, patient is uncooperative)  Descriptions of Associations:-- (unable to assess, patient is uncooperative)  Orientation:Full (Time, Place and Person)  Thought Content:-- (unable to assess, patient is uncooperative)  Hallucinations:Hallucinations: -- (unable to assess, patient is uncooperative)  Ideas of Reference:-- (unable to assess, patient is uncooperative)  Suicidal Thoughts:Suicidal Thoughts: Yes, Active SI Active Intent and/or Plan: With Plan  Homicidal Thoughts:Homicidal Thoughts: No   Sensorium  Memory:-- (unable to assess, patient is uncooperative)  Judgment:-- (unable to assess, patient is uncooperative)  Insight:-- (unable to assess, patient is uncooperative)   Chartered certified accountant:-- (unable to assess, patient is uncooperative)  Attention Span:-- (unable to assess, patient is uncooperative)  Recall:-- (unable to assess, patient is uncooperative)  Fund of Knowledge:-- (unable to assess, patient is uncooperative)  Language:-- (unable to assess, patient is uncooperative)   Psychomotor Activity  Psychomotor Activity:Psychomotor Activity: Normal   Assets  Assets:Physical Health   Sleep  Sleep:Sleep: Good   Physical Exam Constitutional:      General: She is not in acute distress.    Appearance: She is not toxic-appearing.     Comments: drowsy  HENT:     Head:     Comments: Red spot/Bruising to right side of head  Pulmonary:  Effort: No respiratory distress.  Neurological:     Mental Status: She is oriented to person, place, and time.   Psychiatric:        Speech: Speech normal.        Behavior: Behavior is uncooperative.        Thought Content: Thought content includes suicidal ideation. Thought content includes suicidal plan. Thought content does not include homicidal plan.        Judgment: Impulsive: unable to assess, patient is uncooperative.    ROS  There were no vitals taken for this visit. There is no height or weight on file to calculate BMI.  Past Psychiatric History: schizoaffective disorder, Anxiety,  ADHD, MDD   Is the patient at risk to self? Yes  Has the patient been a risk to self in the past 6 months? Yes .    Has the patient been a risk to self within the distant past? Yes   Is the patient a risk to others? Yes   Has the patient been a risk to others in the past 6 months? No   Has the patient been a risk to others within the distant past? Yes   Past Medical History:  Past Medical History:  Diagnosis Date  . ADHD (attention deficit hyperactivity disorder)   . Anxiety   . Heart murmur   . Herpes   . Schizo-affective psychosis (HCC)   . Sickle cell trait (HCC)    History reviewed. No pertinent surgical history.  Family History:  Family History  Problem Relation Age of Onset  . Sickle cell anemia Mother     Social History:  Social History   Socioeconomic History  . Marital status: Significant Other    Spouse name: Not on file  . Number of children: Not on file  . Years of education: Not on file  . Highest education level: Not on file  Occupational History  . Not on file  Tobacco Use  . Smoking status: Current Every Day Smoker    Packs/day: 1.00    Types: Cigarettes  . Smokeless tobacco: Never Used  Vaping Use  . Vaping Use: Never used  Substance and Sexual Activity  . Alcohol use: No  . Drug use: Yes    Types: Marijuana, Cocaine    Comment: last used marijuana yesterday, denies cocaine use  . Sexual activity: Yes    Birth control/protection: I.U.D.  Other Topics Concern   . Not on file  Social History Narrative  . Not on file   Social Determinants of Health   Financial Resource Strain: Not on file  Food Insecurity: Not on file  Transportation Needs: Not on file  Physical Activity: Not on file  Stress: Not on file  Social Connections: Not on file  Intimate Partner Violence: Not on file    SDOH:  SDOH Screenings   Alcohol Screen: Not on file  Depression (PHQ2-9): Medium Risk  . PHQ-2 Score: 12  Financial Resource Strain: Not on file  Food Insecurity: Not on file  Housing: Not on file  Physical Activity: Not on file  Social Connections: Not on file  Stress: Not on file  Tobacco Use: High Risk  . Smoking Tobacco Use: Current Every Day Smoker  . Smokeless Tobacco Use: Never Used  Transportation Needs: Not on file    Last Labs:  Admission on 04/01/2020, Discharged on 04/02/2020  Component Date Value Ref Range Status  . SARS Coronavirus 2 by RT PCR 04/01/2020 NEGATIVE  NEGATIVE Final  Comment: (NOTE) SARS-CoV-2 target nucleic acids are NOT DETECTED.  The SARS-CoV-2 RNA is generally detectable in upper respiratory specimens during the acute phase of infection. The lowest concentration of SARS-CoV-2 viral copies this assay can detect is 138 copies/mL. A negative result does not preclude SARS-Cov-2 infection and should not be used as the sole basis for treatment or other patient management decisions. A negative result may occur with  improper specimen collection/handling, submission of specimen other than nasopharyngeal swab, presence of viral mutation(s) within the areas targeted by this assay, and inadequate number of viral copies(<138 copies/mL). A negative result must be combined with clinical observations, patient history, and epidemiological information. The expected result is Negative.  Fact Sheet for Patients:  BloggerCourse.com  Fact Sheet for Healthcare Providers:   SeriousBroker.it  This test is no                          t yet approved or cleared by the [place] FDA and  has been authorized for detection and/or diagnosis of SARS-CoV-2 by FDA under an Emergency Use Authorization (EUA). This EUA will remain  in effect (meaning this test can be used) for the duration of the COVID-19 declaration under Section 564(b)(1) of the Act, 21 U.S.C.section 360bbb-3(b)(1), unless the authorization is terminated  or revoked sooner.      . Influenza A by PCR 04/01/2020 NEGATIVE  NEGATIVE Final  . Influenza B by PCR 04/01/2020 NEGATIVE  NEGATIVE Final   Comment: (NOTE) The Xpert Xpress SARS-CoV-2/FLU/RSV plus assay is intended as an aid in the diagnosis of influenza from Nasopharyngeal swab specimens and should not be used as a sole basis for treatment. Nasal washings and aspirates are unacceptable for Xpert Xpress SARS-CoV-2/FLU/RSV testing.  Fact Sheet for Patients: BloggerCourse.com  Fact Sheet for Healthcare Providers: SeriousBroker.it  This test is not yet approved or cleared by the [place] FDA and has been authorized for detection and/or diagnosis of SARS-CoV-2 by FDA under an Emergency Use Authorization (EUA). This EUA will remain in effect (meaning this test can be used) for the duration of the COVID-19 declaration under Section 564(b)(1) of the Act, 21 U.S.C. section 360bbb-3(b)(1), unless the authorization is terminated or revoked.  Performed at [facility], [address], [place], [place] [address]   . SARS Coronavirus 2 Ag 04/01/2020 Negative  Negative Preliminary  . WBC 04/01/2020 7.0  4.0 - 10.5 K/uL Final  . RBC 04/01/2020 4.49  3.87 - 5.11 MIL/uL Final  . Hemoglobin 04/01/2020 14.4  12.0 - 15.0 g/dL Final  . HCT 60/45/4098 41.6  36.0 - 46.0 % Final  . MCV 04/01/2020 92.7  80.0 - 100.0 fL Final  . MCH 04/01/2020 32.1  26.0 - 34.0 pg Final   . MCHC 04/01/2020 34.6  30.0 - 36.0 g/dL Final  . RDW 11/91/4782 12.4  11.5 - 15.5 % Final  . Platelets 04/01/2020 207  150 - 400 K/uL Final   Comment: PLATELET COUNT CONFIRMED BY SMEAR Immature Platelet Fraction may be clinically indicated, consider ordering this additional test [id number]   . nRBC 04/01/2020 0.0  0.0 - 0.2 % Final  . Neutrophils Relative % 04/01/2020 47  % Final  . Neutro Abs 04/01/2020 3.3  1.7 - 7.7 K/uL Final  . Lymphocytes Relative 04/01/2020 37  % Final  . Lymphs Abs 04/01/2020 2.6  0.7 - 4.0 K/uL Final  . Monocytes Relative 04/01/2020 11  % Final  . Monocytes Absolute 04/01/2020  0.7  0.1 - 1.0 K/uL Final  . Eosinophils Relative 04/01/2020 4  % Final  . Eosinophils Absolute 04/01/2020 0.3  0.0 - 0.5 K/uL Final  . Basophils Relative 04/01/2020 1  % Final  . Basophils Absolute 04/01/2020 0.0  0.0 - 0.1 K/uL Final  . Immature Granulocytes 04/01/2020 0  % Final  . Abs Immature Granulocytes 04/01/2020 0.02  0.00 - 0.07 K/uL Final   Performed at [facility], [address] [place], [place] [address]  . Sodium 04/01/2020 138  135 - 145 mmol/L Final  . Potassium 04/01/2020 3.4* 3.5 - 5.1 mmol/L Final  . Chloride 04/01/2020 102  98 - 111 mmol/L Final  . CO2 04/01/2020 25  22 - 32 mmol/L Final  . Glucose, Bld 04/01/2020 124* 70 - 99 mg/dL Final   Glucose reference range applies only to samples taken after fasting for at least 8 hours.  . BUN 04/01/2020 <5* 6 - 20 mg/dL Final  . Creatinine, Ser 04/01/2020 0.69  0.44 - 1.00 mg/dL Final  . Calcium 62/13/086501/23/2022 9.5  8.9 - 10.3 mg/dL Final  . Total Protein 04/01/2020 7.2  6.5 - 8.1 g/dL Final  . Albumin 78/46/962901/23/2022 3.9  3.5 - 5.0 g/dL Final  . AST 52/84/132401/23/2022 29  15 - 41 U/L Final  . ALT 04/01/2020 48* 0 - 44 U/L Final  . Alkaline Phosphatase 04/01/2020 52  38 - 126 U/L Final  . Total Bilirubin 04/01/2020 0.8  0.3 - 1.2 mg/dL Final  . GFR, Estimated 04/01/2020 >60  >60 mL/min Final   Comment: (NOTE) Calculated  using the CKD-EPI Creatinine Equation (2021)   . Anion gap 04/01/2020 11  5 - 15 Final   Performed at [facility], [address] [place], [place] [address]  . Hgb A1c MFr Bld 04/01/2020 5.5  4.8 - 5.6 % Final   Comment: (NOTE) Pre diabetes:          5.7%-6.4%  Diabetes:              >6.4%  Glycemic control for   <7.0% adults with diabetes   . Mean Plasma Glucose 04/01/2020 111.15  mg/dL Final   Performed at [facility], [address] [place], [place] [address]  . TSH 04/01/2020 0.732  0.350 - 4.500 uIU/mL Final   Comment: Performed by a 3rd Generation assay with a functional sensitivity of <=0.01 uIU/mL. Performed at [facility], [address] [place], [place] [address]   . POC Amphetamine UR 04/01/2020 None Detected  NONE DETECTED (Cut Off Level 1000 ng/mL) Final  . POC Secobarbital (BAR) 04/01/2020 None Detected  NONE DETECTED (Cut Off Level 300 ng/mL) Final  . POC Buprenorphine (BUP) 04/01/2020 None Detected  NONE DETECTED (Cut Off Level 10 ng/mL) Final  . POC Oxazepam (BZO) 04/01/2020 None Detected  NONE DETECTED (Cut Off Level 300 ng/mL) Final  . POC Cocaine UR 04/01/2020 None Detected  NONE DETECTED (Cut Off Level 300 ng/mL) Final  . POC Methamphetamine UR 04/01/2020 None Detected  NONE DETECTED (Cut Off Level 1000 ng/mL) Final  . POC Morphine 04/01/2020 None Detected  NONE DETECTED (Cut Off Level 300 ng/mL) Final  . POC Oxycodone UR 04/01/2020 None Detected  NONE DETECTED (Cut Off Level 100 ng/mL) Final  . POC Methadone UR 04/01/2020 None Detected  NONE DETECTED (Cut Off Level 300 ng/mL) Final  . POC Marijuana UR 04/01/2020 Positive* NONE DETECTED (Cut Off Level 50 ng/mL) Final  . Preg  Test, Ur 04/02/2020 NEGATIVE  NEGATIVE Final   Comment:        THE SENSITIVITY OF THIS METHODOLOGY IS >24 mIU/mL   . Cholesterol 04/01/2020 126  0 - 200 mg/dL Final  . Triglycerides 04/01/2020 49  <150 mg/dL Final  . HDL 14/78/2956 47  >40 mg/dL Final  . Total  CHOL/HDL Ratio 04/01/2020 2.7  RATIO Final  . VLDL 04/01/2020 10  0 - 40 mg/dL Final  . LDL Cholesterol 04/01/2020 69  0 - 99 mg/dL Final   Comment:        Total Cholesterol/HDL:CHD Risk Coronary Heart Disease Risk Table                     Men   Women  1/2 Average Risk   3.4   3.3  Average Risk       5.0   4.4  2 X Average Risk   9.6   7.1  3 X Average Risk  23.4   11.0        Use the calculated Patient Ratio above and the CHD Risk Table to determine the patient's CHD Risk.        ATP III CLASSIFICATION (LDL):  <100     mg/dL   Optimal  213-086  mg/dL   Near or Above                    Optimal  130-159  mg/dL   Borderline  578-469  mg/dL   High  >629     mg/dL   Very High Performed at [facility], [address], [place], [place] [address]   Admission on 01/22/2020, Discharged on 01/22/2020  Component Date Value Ref Range Status  . SARS Coronavirus 2 by RT PCR 01/22/2020 NEGATIVE  NEGATIVE Final   Comment: (NOTE) SARS-CoV-2 target nucleic acids are NOT DETECTED.  The SARS-CoV-2 RNA is generally detectable in upper respiratoy specimens during the acute phase of infection. The lowest concentration of SARS-CoV-2 viral copies this assay can detect is 131 copies/mL. A negative result does not preclude SARS-Cov-2 infection and should not be used as the sole basis for treatment or other patient management decisions. A negative result may occur with  improper specimen collection/handling, submission of specimen other than nasopharyngeal swab, presence of viral mutation(s) within the areas targeted by this assay, and inadequate number of viral copies (<131 copies/mL). A negative result must be combined with clinical observations, patient history, and epidemiological information. The expected result is Negative.  Fact Sheet for Patients:  https://www.moore.com/  Fact Sheet for Healthcare Providers:  https://www.young.biz/  This  test is no                          t yet approved or cleared by the [place] FDA and  has been authorized for detection and/or diagnosis of SARS-CoV-2 by FDA under an Emergency Use Authorization (EUA). This EUA will remain  in effect (meaning this test can be used) for the duration of the COVID-19 declaration under Section 564(b)(1) of the Act, 21 U.S.C. section 360bbb-3(b)(1), unless the authorization is terminated or revoked sooner.    . Influenza A by PCR 01/22/2020 NEGATIVE  NEGATIVE Final  . Influenza B by PCR 01/22/2020 NEGATIVE  NEGATIVE Final   Comment: (NOTE) The Xpert Xpress SARS-CoV-2/FLU/RSV assay is intended as an aid in  the diagnosis of influenza from Nasopharyngeal swab specimens and  should  not be used as a sole basis for treatment. Nasal washings and  aspirates are unacceptable for Xpert Xpress SARS-CoV-2/FLU/RSV  testing.  Fact Sheet for Patients: https://www.moore.com/  Fact Sheet for Healthcare Providers: https://www.young.biz/  This test is not yet approved or cleared by the [place] FDA and  has been authorized for detection and/or diagnosis of SARS-CoV-2 by  FDA under an Emergency Use Authorization (EUA). This EUA will remain  in effect (meaning this test can be used) for the duration of the  Covid-19 declaration under Section 564(b)(1) of the Act, 21  U.S.C. section 360bbb-3(b)(1), unless the authorization is  terminated or revoked. Performed at [facility], [address], [place], [place] [address]   . SARS Coronavirus 2 Ag 01/22/2020 Negative  Negative Preliminary  . WBC 01/22/2020 6.5  4.0 - 10.5 K/uL Final  . RBC 01/22/2020 4.69  3.87 - 5.11 MIL/uL Final  . Hemoglobin 01/22/2020 14.8  12.0 - 15.0 g/dL Final  . HCT 01/60/1093 43.5  36.0 - 46.0 % Final  . MCV 01/22/2020 92.8  80.0 - 100.0 fL Final  . MCH 01/22/2020 31.6  26.0 - 34.0 pg Final  . MCHC 01/22/2020 34.0  30.0 - 36.0 g/dL Final  .  RDW 23/55/7322 13.0  11.5 - 15.5 % Final  . Platelets 01/22/2020 178  150 - 400 K/uL Final  . nRBC 01/22/2020 0.0  0.0 - 0.2 % Final  . Neutrophils Relative % 01/22/2020 42  % Final  . Neutro Abs 01/22/2020 2.8  1.7 - 7.7 K/uL Final  . Lymphocytes Relative 01/22/2020 40  % Final  . Lymphs Abs 01/22/2020 2.6  0.7 - 4.0 K/uL Final  . Monocytes Relative 01/22/2020 13  % Final  . Monocytes Absolute 01/22/2020 0.8  0.1 - 1.0 K/uL Final  . Eosinophils Relative 01/22/2020 4  % Final  . Eosinophils Absolute 01/22/2020 0.3  0.0 - 0.5 K/uL Final  . Basophils Relative 01/22/2020 1  % Final  . Basophils Absolute 01/22/2020 0.1  0.0 - 0.1 K/uL Final  . Immature Granulocytes 01/22/2020 0  % Final  . Abs Immature Granulocytes 01/22/2020 0.01  0.00 - 0.07 K/uL Final   Performed at [facility], [address] [place], [place] [address]  . Sodium 01/22/2020 137  135 - 145 mmol/L Final  . Potassium 01/22/2020 3.4* 3.5 - 5.1 mmol/L Final  . Chloride 01/22/2020 102  98 - 111 mmol/L Final  . CO2 01/22/2020 23  22 - 32 mmol/L Final  . Glucose, Bld 01/22/2020 85  70 - 99 mg/dL Final   Glucose reference range applies only to samples taken after fasting for at least 8 hours.  . BUN 01/22/2020 7  6 - 20 mg/dL Final  . Creatinine, Ser 01/22/2020 0.77  0.44 - 1.00 mg/dL Final  . Calcium 70/62/3762 9.6  8.9 - 10.3 mg/dL Final  . Total Protein 01/22/2020 7.3  6.5 - 8.1 g/dL Final  . Albumin 83/15/1761 4.3  3.5 - 5.0 g/dL Final  . AST 60/73/7106 33  15 - 41 U/L Final  . ALT 01/22/2020 37  0 - 44 U/L Final  . Alkaline Phosphatase 01/22/2020 51  38 - 126 U/L Final  . Total Bilirubin 01/22/2020 1.2  0.3 - 1.2 mg/dL Final  . GFR, Estimated 01/22/2020 >60  >60 mL/min Final   Comment: (NOTE) Calculated using the CKD-EPI Creatinine Equation (2021)   . Anion gap 01/22/2020 12  5 - 15 Final   Performed at  [facility], [address], [place], [place] [address]  . Hgb A1c MFr Bld 01/22/2020 5.2  4.8 - 5.6  % Final   Comment: (NOTE) Pre diabetes:          5.7%-6.4%  Diabetes:              >6.4%  Glycemic control for   <7.0% adults with diabetes   . Mean Plasma Glucose 01/22/2020 102.54  mg/dL Final   Performed at [facility], [address] [place], [place] [address]  . Magnesium 01/22/2020 2.2  1.7 - 2.4 mg/dL Final   Performed at [facility], [address] [place], [place] [address]  . Alcohol, Ethyl (B) 01/22/2020 <10  <10 mg/dL Final   Comment: (NOTE) Lowest detectable limit for serum alcohol is 10 mg/dL.  For medical purposes only. Performed at [facility], [address] [place], [place] [address]   . TSH 01/22/2020 0.621  0.350 - 4.500 uIU/mL Final   Comment: Performed by a 3rd Generation assay with a functional sensitivity of <=0.01 uIU/mL. Performed at [facility], [address] [place], [place] [address]   . Prolactin 01/22/2020 5.7  4.8 - 23.3 ng/mL Final   Comment: (NOTE) Performed At: [facility] [address] [place], [place] [id number] Jolene Schimke MD ON:[id number]   . POC Amphetamine UR 01/22/2020 None Detected  None Detected Final  . POC Secobarbital (BAR) 01/22/2020 None Detected  None Detected Final  . POC Buprenorphine (BUP) 01/22/2020 None Detected  None Detected Final  . POC Oxazepam (BZO) 01/22/2020 None Detected  None Detected Final  . POC Cocaine UR 01/22/2020 None Detected  None Detected Final  . POC Methamphetamine UR 01/22/2020 None Detected  None Detected Final  . POC Morphine 01/22/2020 None Detected  None Detected Final  . POC Oxycodone UR 01/22/2020 None Detected  None Detected Final  . POC Methadone UR 01/22/2020 None Detected  None Detected Final  . POC Marijuana UR 01/22/2020 Positive* None Detected Final  . Preg Test, Ur 01/22/2020 NEGATIVE  NEGATIVE Final   Comment:        THE SENSITIVITY OF THIS METHODOLOGY IS >24 mIU/mL   . Glucose-Capillary 01/22/2020 85  70 - 99 mg/dL Final   Glucose  reference range applies only to samples taken after fasting for at least 8 hours.  . Cholesterol 01/22/2020 136  0 - 200 mg/dL Final  . Triglycerides 01/22/2020 48  <150 mg/dL Final  . HDL 44/03/270 57  >40 mg/dL Final  . Total CHOL/HDL Ratio 01/22/2020 2.4  RATIO Final  . VLDL 01/22/2020 10  0 - 40 mg/dL Final  . LDL Cholesterol 01/22/2020 69  0 - 99 mg/dL Final   Comment:        Total Cholesterol/HDL:CHD Risk Coronary Heart Disease Risk Table                     Men   Women  1/2 Average Risk   3.4   3.3  Average Risk       5.0   4.4  2 X Average Risk   9.6   7.1  3 X Average Risk  23.4   11.0        Use the calculated Patient Ratio above and the CHD Risk Table to determine the patient's CHD Risk.        ATP III CLASSIFICATION (LDL):  <100     mg/dL  Optimal  100-129  mg/dL   Near or Above                    Optimal  130-159  mg/dL   Borderline  536-644  mg/dL   High  >034     mg/dL   Very High Performed at [facility], [address] [place], [place] [address]   Admission on 12/26/2019, Discharged on 12/27/2019  Component Date Value Ref Range Status  . SARS Coronavirus 2 by RT PCR 12/26/2019 NEGATIVE  NEGATIVE Final   Comment: (NOTE) SARS-CoV-2 target nucleic acids are NOT DETECTED.  The SARS-CoV-2 RNA is generally detectable in upper respiratoy specimens during the acute phase of infection. The lowest concentration of SARS-CoV-2 viral copies this assay can detect is 131 copies/mL. A negative result does not preclude SARS-Cov-2 infection and should not be used as the sole basis for treatment or other patient management decisions. A negative result may occur with  improper specimen collection/handling, submission of specimen other than nasopharyngeal swab, presence of viral mutation(s) within the areas targeted by this assay, and inadequate number of viral copies (<131 copies/mL). A negative result must be combined with clinical observations, patient history,  and epidemiological information. The expected result is Negative.  Fact Sheet for Patients:  https://www.moore.com/  Fact Sheet for Healthcare Providers:  https://www.young.biz/  This test is no                          t yet approved or cleared by the [place] FDA and  has been authorized for detection and/or diagnosis of SARS-CoV-2 by FDA under an Emergency Use Authorization (EUA). This EUA will remain  in effect (meaning this test can be used) for the duration of the COVID-19 declaration under Section 564(b)(1) of the Act, 21 U.S.C. section 360bbb-3(b)(1), unless the authorization is terminated or revoked sooner.    . Influenza A by PCR 12/26/2019 NEGATIVE  NEGATIVE Final  . Influenza B by PCR 12/26/2019 NEGATIVE  NEGATIVE Final   Comment: (NOTE) The Xpert Xpress SARS-CoV-2/FLU/RSV assay is intended as an aid in  the diagnosis of influenza from Nasopharyngeal swab specimens and  should not be used as a sole basis for treatment. Nasal washings and  aspirates are unacceptable for Xpert Xpress SARS-CoV-2/FLU/RSV  testing.  Fact Sheet for Patients: https://www.moore.com/  Fact Sheet for Healthcare Providers: https://www.young.biz/  This test is not yet approved or cleared by the [place] FDA and  has been authorized for detection and/or diagnosis of SARS-CoV-2 by  FDA under an Emergency Use Authorization (EUA). This EUA will remain  in effect (meaning this test can be used) for the duration of the  Covid-19 declaration under Section 564(b)(1) of the Act, 21  U.S.C. section 360bbb-3(b)(1), unless the authorization is  terminated or revoked. Performed at [facility], [address], [place], [place] [address]   . WBC 12/26/2019 8.3  4.0 - 10.5 K/uL Final  . RBC 12/26/2019 4.52  3.87 - 5.11 MIL/uL Final  . Hemoglobin 12/26/2019 14.2  12.0 - 15.0 g/dL Final  . HCT 56/43/3295 41.4   36.0 - 46.0 % Final  . MCV 12/26/2019 91.6  80.0 - 100.0 fL Final  . MCH 12/26/2019 31.4  26.0 - 34.0 pg Final  . MCHC 12/26/2019 34.3  30.0 - 36.0 g/dL Final  . RDW 18/84/1660 12.8  11.5 - 15.5 % Final  . Platelets 12/26/2019 PLATELET CLUMPS NOTED  ON SMEAR, UNABLE TO ESTIMATE  150 - 400 K/uL Final   Comment: REPEATED TO VERIFY SPECIMEN CHECKED FOR CLOTS   . nRBC 12/26/2019 0.0  0.0 - 0.2 % Final  . Neutrophils Relative % 12/26/2019 52  % Final  . Neutro Abs 12/26/2019 4.3  1.7 - 7.7 K/uL Final  . Lymphocytes Relative 12/26/2019 34  % Final  . Lymphs Abs 12/26/2019 2.8  0.7 - 4.0 K/uL Final  . Monocytes Relative 12/26/2019 8  % Final  . Monocytes Absolute 12/26/2019 0.7  0.1 - 1.0 K/uL Final  . Eosinophils Relative 12/26/2019 5  % Final  . Eosinophils Absolute 12/26/2019 0.4  0.0 - 0.5 K/uL Final  . Basophils Relative 12/26/2019 1  % Final  . Basophils Absolute 12/26/2019 0.0  0.0 - 0.1 K/uL Final  . Immature Granulocytes 12/26/2019 0  % Final  . Abs Immature Granulocytes 12/26/2019 0.01  0.00 - 0.07 K/uL Final   Performed at [facility], [address], [place], [place] [address]  . Sodium 12/26/2019 138  135 - 145 mmol/L Final  . Potassium 12/26/2019 3.6  3.5 - 5.1 mmol/L Final  . Chloride 12/26/2019 102  98 - 111 mmol/L Final  . CO2 12/26/2019 26  22 - 32 mmol/L Final  . Glucose, Bld 12/26/2019 93  70 - 99 mg/dL Final   Glucose reference range applies only to samples taken after fasting for at least 8 hours.  . BUN 12/26/2019 6  6 - 20 mg/dL Final  . Creatinine, Ser 12/26/2019 0.79  0.44 - 1.00 mg/dL Final  . Calcium 87/56/4332 9.6  8.9 - 10.3 mg/dL Final  . Total Protein 12/26/2019 7.4  6.5 - 8.1 g/dL Final  . Albumin 95/18/8416 4.1  3.5 - 5.0 g/dL Final  . AST 60/63/0160 24  15 - 41 U/L Final  . ALT 12/26/2019 28  0 - 44 U/L Final  . Alkaline Phosphatase 12/26/2019 55  38 - 126 U/L Final  . Total Bilirubin 12/26/2019 1.0  0.3 - 1.2 mg/dL Final  . GFR, Estimated  12/26/2019 >60  >60 mL/min Final  . Anion gap 12/26/2019 10  5 - 15 Final   Performed at [facility], [address] [place], [place] [address]  . Hgb A1c MFr Bld 12/26/2019 5.1  4.8 - 5.6 % Final   Comment: (NOTE)         Prediabetes: 5.7 - 6.4         Diabetes: >6.4         Glycemic control for adults with diabetes: <7.0   . Mean Plasma Glucose 12/26/2019 100  mg/dL Final   Comment: (NOTE) Performed At: [facility] [address] [place], [place] [id number] Jolene Schimke MD RK:[id number]   . Cholesterol 12/26/2019 150  0 - 200 mg/dL Final  . Triglycerides 12/26/2019 46  <150 mg/dL Final  . HDL 31/51/7616 57  >40 mg/dL Final  . Total CHOL/HDL Ratio 12/26/2019 2.6  RATIO Final  . VLDL 12/26/2019 9  0 - 40 mg/dL Final  . LDL Cholesterol 12/26/2019 84  0 - 99 mg/dL Final   Comment:        Total Cholesterol/HDL:CHD Risk Coronary Heart Disease Risk Table                     Men   Women  1/2 Average Risk   3.4   3.3  Average Risk       5.0  4.4  2 X Average Risk   9.6   7.1  3 X Average Risk  23.4   11.0        Use the calculated Patient Ratio above and the CHD Risk Table to determine the patient's CHD Risk.        ATP III CLASSIFICATION (LDL):  <100     mg/dL   Optimal  102-111  mg/dL   Near or Above                    Optimal  130-159  mg/dL   Borderline  735-670  mg/dL   High  >141     mg/dL   Very High Performed at [facility], [address], [place], [place] [address]   . TSH 12/26/2019 0.869  0.350 - 4.500 uIU/mL Final   Comment: Performed by a 3rd Generation assay with a functional sensitivity of <=0.01 uIU/mL. Performed at [facility], [address] [place], [place] [address]   . POC Amphetamine UR 12/26/2019 None Detected  None Detected Final  . POC Secobarbital (BAR) 12/26/2019 None Detected  None Detected Final  . POC Buprenorphine (BUP) 12/26/2019 None Detected  None Detected Final  . POC Oxazepam (BZO) 12/26/2019 None  Detected  None Detected Final  . POC Cocaine UR 12/26/2019 None Detected  None Detected Final  . POC Methamphetamine UR 12/26/2019 None Detected  None Detected Final  . POC Morphine 12/26/2019 None Detected  None Detected Final  . POC Oxycodone UR 12/26/2019 None Detected  None Detected Final  . POC Methadone UR 12/26/2019 None Detected  None Detected Final  . POC Marijuana UR 12/26/2019 Positive* None Detected Final  . Preg Test, Ur 12/26/2019 NEGATIVE  NEGATIVE Final   Comment:        THE SENSITIVITY OF THIS METHODOLOGY IS >24 mIU/mL   . SARS Coronavirus 2 Ag 12/26/2019 Negative  Negative Preliminary    Allergies: Red dye and Zyprexa [olanzapine]  PTA Medications: (Not in a hospital admission)   Medical Decision Making  Will admit patient to [facility] for observation.  Continue home medication Patient will like to resume ACT services      Recommendations  Based on my evaluation the patient appears to have an emergency medical condition for which I recommend the patient be transferred to the emergency department for further evaluation.  Maricela Bo, NP 04/06/20  5:37 AM

## 2020-04-06 NOTE — ED Notes (Signed)
Patient states she is unable to urinate at this time. 

## 2020-04-06 NOTE — ED Notes (Signed)
Pt resting on pull out with eyes closed unlabored respirations through night in no acute distress. Safety maintained.  

## 2020-04-06 NOTE — ED Provider Notes (Signed)
FBC/OBS ASAP Discharge Summary  Date and Time: 04/06/2020 1:09 PM  Name: Betty Parrish  MRN:  [id number]   Discharge Diagnoses:  Final diagnoses:  Schizoaffective disorder, bipolar type [facility])  Suicide ideation    Subjective:  Patient interviewed bedside, she was calm, cooperative and pleasant. She denied SI/HI/AVH. She stated that her biggest complaint today was a stomach ache and some nausea that started last night after eating. She stated that she did not have transportation to make it to her appointment at the [facility] and requested sample medication. Discussed open access hours and placed information in AVS. Pt stated that she would like to be discharged to a homeless shelter since she is not welcome home.   Stay Summary:  Patient is 31yrs/o female. She Presented voluntarily via [name]. Patient presented with complaint of suicidal ideation. Patient is uncooperative with interview. She refused to open her eyes and occasionally will wake up and say a few words and return to sleep. Patient stated "I try to cut myself today." patient also stated "I plan to take some pills." Patient verbalized to [facility], RN that her stressor was her baby daddy refusing to let her see her kid despite a court order. Patient also informed Jonelle, RN that she want to reactivate her ACT team.  Per Willia Craze RN, patient endorses visual and auditory hallucinations; states patient endorses seeing shadows and hearing voices. Patient informed [place], TTS that she recently did crack cocaine, drinks alcohol daily, and marijuana. UDS+marijuana  Patient continues to refused to answer questions from this writer and to participate in interview. She remind asleep despites several prompts to wake up. Patient is unable to contract for safety.  Patient kept overnight for observation and was restarted on abilify. The following day, patient denied SI/HI/AVh, was able to contract for safety. She requested medication samples and stated that she  would like to be discharged to a homeless shelter  Total Time spent with patient: 20 minutes  Past Psychiatric History: see H&P Past Medical History:  Past Medical History:  Diagnosis Date  . ADHD (attention deficit hyperactivity disorder)   . Anxiety   . Heart murmur   . Herpes   . Schizo-affective psychosis (HCC)   . Sickle cell trait (HCC)    History reviewed. No pertinent surgical history. Family History:  Family History  Problem Relation Age of Onset  . Sickle cell anemia Mother    Family Psychiatric History: see H&P Social History:  Social History   Substance and Sexual Activity  Alcohol Use No     Social History   Substance and Sexual Activity  Drug Use Yes  . Types: Marijuana, Cocaine   Comment: last used marijuana yesterday, denies cocaine use    Social History   Socioeconomic History  . Marital status: Significant Other    Spouse name: Not on file  . Number of children: Not on file  . Years of education: Not on file  . Highest education level: Not on file  Occupational History  . Not on file  Tobacco Use  . Smoking status: Current Every Day Smoker    Packs/day: 1.00    Types: Cigarettes  . Smokeless tobacco: Never Used  Vaping Use  . Vaping Use: Never used  Substance and Sexual Activity  . Alcohol use: No  . Drug use: Yes    Types: Marijuana, Cocaine    Comment: last used marijuana yesterday, denies cocaine use  . Sexual activity: Yes    Birth control/protection: I.U.D.  Other  Topics Concern  . Not on file  Social History Narrative  . Not on file   Social Determinants of Health   Financial Resource Strain: Not on file  Food Insecurity: Not on file  Transportation Needs: Not on file  Physical Activity: Not on file  Stress: Not on file  Social Connections: Not on file   SDOH:  SDOH Screenings   Alcohol Screen: Not on file  Depression (PHQ2-9): Medium Risk  . PHQ-2 Score: 12  Financial Resource Strain: Not on file  Food Insecurity:  Not on file  Housing: Not on file  Physical Activity: Not on file  Social Connections: Not on file  Stress: Not on file  Tobacco Use: High Risk  . Smoking Tobacco Use: Current Every Day Smoker  . Smokeless Tobacco Use: Never Used  Transportation Needs: Not on file    Has this patient used any form of tobacco in the last 30 days? (Cigarettes, Smokeless Tobacco, Cigars, and/or Pipes) Prescription not provided because: n/a  Current Medications:  Current Facility-Administered Medications  Medication Dose Route Frequency Provider Last Rate Last Admin  . acetaminophen (TYLENOL) tablet 650 mg  650 mg Oral Q6H PRN Ajibola, Ene A, NP      . alum & mag hydroxide-simeth (MAALOX/MYLANTA) 200-200-20 MG/5ML suspension 30 mL  30 mL Oral Q4H PRN Ajibola, Ene A, NP      . ARIPiprazole (ABILIFY) tablet 10 mg  10 mg Oral Daily Ajibola, Ene A, NP      . hydrOXYzine (ATARAX/VISTARIL) tablet 25 mg  25 mg Oral TID PRN Ajibola, Ene A, NP      . magnesium hydroxide (MILK OF MAGNESIA) suspension 30 mL  30 mL Oral Daily PRN Ajibola, Ene A, NP      . traZODone (DESYREL) tablet 50 mg  50 mg Oral QHS PRN Ajibola, Ene A, NP       Current Outpatient Medications  Medication Sig Dispense Refill  . ARIPiprazole (ABILIFY) 10 MG tablet Take 1 tablet (10 mg total) by mouth daily. 30 tablet 0    PTA Medications: (Not in a hospital admission)   Musculoskeletal  Strength & Muscle Tone: within normal limits Gait & Station: normal Patient leans: N/A  Psychiatric Specialty Exam  Presentation  General Appearance: Appropriate for Environment; Disheveled  Eye Contact:Fair  Speech:Clear and Coherent; Normal Rate  Speech Volume:Normal  Handedness:Right   Mood and Affect  Mood:Euthymic  Affect:Appropriate; Congruent; Constricted   Thought Process  Thought Processes:Coherent; Goal Directed  Descriptions of Associations:Intact  Orientation:Full (Time, Place and Person)  Thought  Content:WDL  Hallucinations:Hallucinations: None  Ideas of Reference:None  Suicidal Thoughts:Suicidal Thoughts: No SI Active Intent and/or Plan: With Plan  Homicidal Thoughts:Homicidal Thoughts: No   Sensorium  Memory:Immediate Good; Recent Fair; Remote Fair  Judgment:Fair  Insight:Fair   Executive Functions  Concentration:Fair  Attention Span:Fair  Recall:Fair  Fund of Knowledge:Fair  Language:Good   Psychomotor Activity  Psychomotor Activity:Psychomotor Activity: Normal   Assets  Assets:Desire for Improvement; Physical Health   Sleep  Sleep:Sleep: Fair   Physical Exam  Physical Exam Constitutional:      Appearance: Normal appearance. She is normal weight.  HENT:     Head: Normocephalic and atraumatic.  Eyes:     Extraocular Movements: Extraocular movements intact.  Pulmonary:     Effort: Pulmonary effort is normal.  Neurological:     Mental Status: She is alert.    Review of Systems  Constitutional: Negative for chills and fever.  Respiratory: Negative for cough.  Cardiovascular: Negative for chest pain.  Gastrointestinal: Positive for abdominal pain and nausea.  Psychiatric/Behavioral: Negative for suicidal ideas.   Blood pressure 128/72, pulse 86, temperature 98.9 F (37.2 C), temperature source Oral, resp. rate 16, SpO2 97 %. There is no height or weight on file to calculate BMI.  Demographic Factors:  Low socioeconomic status and Unemployed  Loss Factors: NA  Historical Factors: NA  Risk Reduction Factors:   NA  Continued Clinical Symptoms:  Alcohol/Substance Abuse/Dependencies  Cognitive Features That Contribute To Risk:  Thought constriction (tunnel vision)    Suicide Risk:  Minimal: No identifiable suicidal ideation.  Patients presenting with no risk factors but with morbid ruminations; may be classified as minimal risk based on the severity of the depressive symptoms  Plan Of Care/Follow-up recommendations:   Activity:  as tolerated Diet:  regular Other:     Patient is instructed prior to discharge to: Take all medications as prescribed by his/her mental healthcare provider. Report any adverse effects and or reactions from the medicines to his/her outpatient provider promptly. Patient has been instructed & cautioned: To not engage in alcohol and or illegal drug use while on prescription medicines. In the event of worsening symptoms, patient is instructed to call the crisis hotline, 911 and or go to the nearest ED for appropriate evaluation and treatment of symptoms. To follow-up with his/her primary care provider for your other medical issues, concerns and or health care needs.   Patient provided with abilify 10 mg 7 day sample and printed script for abilify 10 mg #30  [facility] during walk in hours for appointment with psychiatrist for further medication management and for therapy.   Walk in hours are 8-11 AM Monday through Thursday for medication management.It is first come, first -serve; it is best to arrive by 7:30 AM. On Friday from 1 pm to 4 pm for therapy intake only. Please arrive by 12:00 pm as it is  first come, first -serve.   When you arrive please go upstairs for your appointment. If you are unsure of where to go, inform the front desk that you are here for a walk in appointment and they will assist you with directions upstairs.  Address:  [address], in [place], [address] Ph: [phone]   Disposition: homeless shelter/safe care   Estella Husk, MD 04/06/2020, 1:09 PM

## 2020-04-06 NOTE — Progress Notes (Signed)
Patient resting quietly, eyes are closed, respirations are even and unlabored at this time. No objective signs of anxiety or shortness of breath. Staff will continue to monitor.

## 2020-04-06 NOTE — Discharge Summary (Signed)
Betty Parrish to be D/C'd Home per MD order.  Discussed with the patient and all questions fully answered.  VSS, Skin clean, dry and intact without evidence of skin break down, no evidence of skin tears noted.  An After Visit Summary was printed and given to the patient. Patient received prescription and sample medication of Abilify.  D/c education completed with patient including follow up instructions, medication list, - patient able to verbalize understanding, all questions fully answered.   Patient instructed to return to ED, call 911, or call MD for any changes in condition.   Patient escorted to [employer] via safe transport  Leamon Arnt 04/06/2020 1:00 PM

## 2020-04-06 NOTE — Discharge Instructions (Signed)
Please come to [facility] facility[facility] during walk in hours for appointment with psychiatrist for further medication management and for therapy.   Walk in hours are 8-11 AM Monday through Thursday for medication management.It is first come, first -serve; it is best to arrive by 7:30 AM. On Friday from 1 pm to 4 pm for therapy intake only. Please arrive by 12:00 pm as it is  first come, first -serve.   When you arrive please go upstairs for your appointment. If you are unsure of where to go, inform the front desk that you are here for a walk in appointment and they will assist you with directions upstairs.  Address:  [address], in [place], [address] Ph: [phone]   

## 2021-01-03 ENCOUNTER — Ambulatory Visit (HOSPITAL_COMMUNITY)
Admission: EM | Admit: 2021-01-03 | Discharge: 2021-01-03 | Disposition: A | Discharge status: Home / Self Care | Payer: Medicaid Other | Attending: Family | Admitting: Family

## 2021-01-03 DIAGNOSIS — Z9152 Personal history of nonsuicidal self-harm: Secondary | ICD-10-CM | POA: Insufficient documentation

## 2021-01-03 DIAGNOSIS — F250 Schizoaffective disorder, bipolar type: Secondary | ICD-10-CM | POA: Insufficient documentation

## 2021-01-03 DIAGNOSIS — F1210 Cannabis abuse, uncomplicated: Secondary | ICD-10-CM | POA: Insufficient documentation

## 2021-01-03 MED ORDER — TRAZODONE HCL 50 MG PO TABS: 50.0000 mg | ORAL_TABLET | Freq: Every day | ORAL | 0 refills | Status: AC

## 2021-01-03 MED ORDER — ARIPIPRAZOLE 10 MG PO TABS: 10.0000 mg | ORAL_TABLET | Freq: Every day | ORAL | 0 refills | Status: AC

## 2021-01-03 NOTE — Discharge Summary (Signed)
Betty Parrish to be D/C'd Home per NP order. Discussed with the patient and all questions fully answered. An After Visit Summary was printed and given to the patient. Patient escorted out and D/C home via private auto.  Dickie La  01/03/2021 1:07 PM

## 2021-01-03 NOTE — Progress Notes (Signed)
   01/03/21 1155  [facility] Triage Screening (Walk-ins at [facility] only)  How Did You Hear About [place]? Self  What Is the Reason for Your Visit/Call Today? Ashiyah 30 year old presents to [facility] with mom and step-dad. Patient reports she schizoaffective and is manic. Report her medication is old and is no longer Financial [employer]. Patient has been without medication at least 8 to 9 months (1st of the year). Patient was on an ACTT Team but has been discharged. Patient denied suicidal/homicidal, denied auditory/visual hallucinations. Patient last slept 3 days ago. Mom report patient mood has been up and down. Patient smokes THC everyday.  How Long Has This Been Causing You Problems? 1-6 months  Have You Recently Had Any Thoughts About Hurting Yourself? No  Are You Planning to Commit Suicide/Harm Yourself At This time? No  Have you Recently Had Thoughts About Hurting Someone Karolee Ohs? No  Are You Planning To Harm Someone At This Time? No  Are you currently experiencing any auditory, visual or other hallucinations? No  Have You Used Any Alcohol or Drugs in the Past 24 Hours? No  Do you have any current medical co-morbidities that require immediate attention? No  Clinician description of patient physical appearance/behavior: Patient dressed appropriately for the weather  What Do You Feel Would Help You the Most Today? Medication(s);Stress Management  If access to [facility] was not available, would you have sought care in the Emergency Department? Yes  Determination of Need Urgent (48 hours)  Options For Referral Medication Management

## 2021-01-03 NOTE — Discharge Instructions (Addendum)
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
## 2021-01-03 NOTE — BH Assessment (Signed)
Comprehensive Clinical Assessment (CCA) Screening, Triage and Referral Note  01/03/2021 Nakiyah Beverley [id number]  Disposition: Per Doran Heater, NP, patient is psychiatrically cleared.   Flowsheet Row ED from 01/03/2021 in [facility] ED from 04/06/2020 in [facility] ED from 04/01/2020 in [facility]  C-SSRS RISK CATEGORY No Risk High Risk No Risk      The patient demonstrates the following risk factors for suicide: Chronic risk factors for suicide include: psychiatric disorder of Schizoaffective disorder and substance use disorder. Acute risk factors for suicide include: N/A. Protective factors for this patient include: positive social support, responsibility to others (children, family), hope for the future, and religious beliefs against suicide. Considering these factors, the overall suicide risk at this point appears to be low. Patient is appropriate for outpatient follow up.   Chief Complaint:  Chief Complaint  Patient presents with   Manic Behavior    Kimetha 30 year old presents to [facility] with mom and step-dad. Patient reports she schizoaffective and is manic. Report her medication is old and is no longer Financial [employer]. Patient has been without medication at least 8 to 9 months (1st of the year). Patient was on an ACTT Team but has been discharged. Patient denied suicidal/homicidal, denied auditory/visual hallucinations. Patient last slept 3 days ago. Mom report patient mood has been up and down. Patient smokes THC everyday.    Visit Diagnosis:  Cannabis abuse  Schizoaffective disorder, bipolar type [facility])    Patient Reported Information How did you hear about [place]? Self  What Is the Reason for Your Visit/Call Today? Demetric 30 year old presents to [facility] with mom and step-dad. Patient reports she schizoaffective and is manic. Report her medication is old and is no longer Financial controller. Patient has been without medication at  least 8 to 9 months (1st of the year). Patient was on an ACTT Team but has been discharged. Patient denied suicidal/homicidal, denied auditory/visual hallucinations. Patient last slept 3 days ago. Mom report patient mood has been up and down. Patient smokes THC everyday.  How Long Has This Been Causing You Problems? 1-6 months  What Do You Feel Would Help You the Most Today? Medication(s); Stress Management   Have You Recently Had Any Thoughts About Hurting Yourself? No  Are You Planning to Commit Suicide/Harm Yourself At This time? No   Have you Recently Had Thoughts About Hurting Someone Karolee Ohs? No  Are You Planning to Harm Someone at This Time? No  Explanation: No data recorded  Have You Used Any Alcohol or Drugs in the Past 24 Hours? No  How Long Ago Did You Use Drugs or Alcohol? 0000 (Pt is unsure)  What Did You Use and How Much? Cocaine Weed  (Phreesia 04/06/2020)   Do You Currently Have a Therapist/Psychiatrist? Yes  Name of Therapist/Psychiatrist: [facility]   Have You Been Recently Discharged From Any Office Practice or Programs? No  Explanation of Discharge From Practice/Program: unknown (Phreesia 04/06/2020)    CCA Screening Triage Referral Assessment Type of Contact: Face-to-Face  Telemedicine Service Delivery:   Is this Initial or Reassessment? No data recorded Date Telepsych consult ordered in [facility]:  No data recorded Time Telepsych consult ordered in [facility]:  No data recorded Location of Assessment: [facility] Assessment Services  Provider Location: [facility] Assessment Services   Collateral Involvement: Mom   Does Patient Have a Automotive engineer Guardian? No data recorded Name and Contact of Legal Guardian: No data recorded If Minor and Not Living  with Parent(s), Who has Custody? N/A  Is CPS involved or ever been involved? -- (UTA)  Is APS involved or ever been involved? -- (UTA)   Patient Determined To Be At Risk for Harm To Self or Others Based  on Review of Patient Reported Information or Presenting Complaint? No  Method: No data recorded Availability of Means: No data recorded Intent: No data recorded Notification Required: No data recorded Additional Information for Danger to Others Potential: No data recorded Additional Comments for Danger to Others Potential: No data recorded Are There Guns or Other Weapons in Your Home? No data recorded Types of Guns/Weapons: No data recorded Are These Weapons Safely Secured?                            No data recorded Who Could Verify You Are Able To Have These Secured: No data recorded Do You Have any Outstanding Charges, Pending Court Dates, Parole/Probation? No data recorded Contacted To Inform of Risk of Harm To Self or Others: Other: Comment (Clinician was unable to obtain contact information/consent)   Does Patient Present under Involuntary Commitment? No  IVC Papers Initial File Date: No data recorded  [place] of Residence: [place]   Patient Currently Receiving the Following Services: Medication Management   Determination of Need: Urgent (48 hours)   Options For Referral: Outpatient Therapy; Other: Comment (ACTT/CST)   Discharge Disposition:     Audree Camel, [facility]

## 2021-01-04 ENCOUNTER — Telehealth (HOSPITAL_COMMUNITY): Payer: Self-pay | Admitting: Family Medicine

## 2021-01-04 NOTE — BH Assessment (Signed)
Care Management - Follow Up [facility] Discharges   Writer made contact with the patient. Patient reports that he has an appointment today with [facility] to receive ACTT services.

## 2021-01-15 ENCOUNTER — Ambulatory Visit (HOSPITAL_COMMUNITY)
Admission: EM | Admit: 2021-01-15 | Discharge: 2021-01-15 | Disposition: A | Discharge status: Home / Self Care | Payer: Medicaid Other | Attending: Nurse Practitioner | Admitting: Nurse Practitioner

## 2021-01-15 ENCOUNTER — Encounter (HOSPITAL_COMMUNITY): Payer: Self-pay | Admitting: Emergency Medicine

## 2021-01-15 DIAGNOSIS — Z20822 Contact with and (suspected) exposure to covid-19: Secondary | ICD-10-CM | POA: Diagnosis not present

## 2021-01-15 DIAGNOSIS — F1290 Cannabis use, unspecified, uncomplicated: Secondary | ICD-10-CM | POA: Insufficient documentation

## 2021-01-15 DIAGNOSIS — Z5900 Homelessness unspecified: Secondary | ICD-10-CM | POA: Insufficient documentation

## 2021-01-15 DIAGNOSIS — F250 Schizoaffective disorder, bipolar type: Secondary | ICD-10-CM | POA: Diagnosis not present

## 2021-01-15 DIAGNOSIS — F1210 Cannabis abuse, uncomplicated: Secondary | ICD-10-CM | POA: Diagnosis present

## 2021-01-15 DIAGNOSIS — F17210 Nicotine dependence, cigarettes, uncomplicated: Secondary | ICD-10-CM | POA: Insufficient documentation

## 2021-01-15 DIAGNOSIS — R45851 Suicidal ideations: Secondary | ICD-10-CM | POA: Insufficient documentation

## 2021-01-15 LAB — COMPREHENSIVE METABOLIC PANEL
ALT: 48 U/L — ABNORMAL HIGH (ref 0–44)
AST: 62 U/L — ABNORMAL HIGH (ref 15–41)
Albumin: 4.1 g/dL (ref 3.5–5.0)
Alkaline Phosphatase: 53 U/L (ref 38–126)
Anion gap: 9 (ref 5–15)
BUN: <5 mg/dL — ABNORMAL LOW (ref 6–20)
CO2: 25 mmol/L (ref 22–32)
Calcium: 9.4 mg/dL (ref 8.9–10.3)
Chloride: 102 mmol/L (ref 98–111)
Creatinine, Ser: 0.75 mg/dL (ref 0.44–1.00)
GFR, Estimated: >60 mL/min (ref >60)
Glucose, Bld: 83 mg/dL (ref 70–99)
Potassium: 3.4 mmol/L — ABNORMAL LOW (ref 3.5–5.1)
Sodium: 136 mmol/L (ref 135–145)
Total Bilirubin: 1 mg/dL (ref 0.3–1.2)
Total Protein: 7.2 g/dL (ref 6.5–8.1)

## 2021-01-15 LAB — CBC WITH DIFFERENTIAL/PLATELET
Abs Immature Granulocytes: 0.02 10*3/uL (ref 0.00–0.07)
Basophils Absolute: 0 10*3/uL (ref 0.0–0.1)
Basophils Relative: 0 %
Eosinophils Absolute: 0.4 10*3/uL (ref 0.0–0.5)
Eosinophils Relative: 6 %
HCT: 41.5 % (ref 36.0–46.0)
Hemoglobin: 14.4 g/dL (ref 12.0–15.0)
Immature Granulocytes: 0 %
Lymphocytes Relative: 36 %
Lymphs Abs: 2.6 10*3/uL (ref 0.7–4.0)
MCH: 32.2 pg (ref 26.0–34.0)
MCHC: 34.7 g/dL (ref 30.0–36.0)
MCV: 92.8 fL (ref 80.0–100.0)
Monocytes Absolute: 0.7 10*3/uL (ref 0.1–1.0)
Monocytes Relative: 10 %
Neutro Abs: 3.4 10*3/uL (ref 1.7–7.7)
Neutrophils Relative %: 48 %
Platelets: 152 10*3/uL (ref 150–400)
RBC: 4.47 MIL/uL (ref 3.87–5.11)
RDW: 12.7 % (ref 11.5–15.5)
WBC: 7 10*3/uL (ref 4.0–10.5)
nRBC: 0 % (ref 0.0–0.2)

## 2021-01-15 LAB — POCT URINE DRUG SCREEN - MANUAL ENTRY (I-SCREEN)
POC Amphetamine UR: NOT DETECTED
POC Buprenorphine (BUP): NOT DETECTED
POC Cocaine UR: NOT DETECTED
POC Marijuana UR: POSITIVE — AB
POC Methadone UR: NOT DETECTED
POC Methamphetamine UR: NOT DETECTED
POC Morphine: NOT DETECTED
POC Oxazepam (BZO): NOT DETECTED
POC Oxycodone UR: NOT DETECTED
POC Secobarbital (BAR): NOT DETECTED

## 2021-01-15 LAB — HEMOGLOBIN A1C
Hgb A1c MFr Bld: 5.6 % (ref 4.8–5.6)
Mean Plasma Glucose: 114.02 mg/dL

## 2021-01-15 LAB — RESP PANEL BY RT-PCR (FLU A&B, COVID) ARPGX2
Influenza A by PCR: POSITIVE — AB
Influenza B by PCR: NEGATIVE
SARS Coronavirus 2 by RT PCR: NEGATIVE

## 2021-01-15 LAB — ETHANOL: Alcohol, Ethyl (B): <10 mg/dL (ref <10)

## 2021-01-15 LAB — POCT PREGNANCY, URINE: Preg Test, Ur: NEGATIVE

## 2021-01-15 LAB — LIPID PANEL
Cholesterol: 148 mg/dL (ref 0–200)
HDL: 54 mg/dL (ref >40)
LDL Cholesterol: 81 mg/dL (ref 0–99)
Total CHOL/HDL Ratio: 2.7 RATIO
Triglycerides: 64 mg/dL (ref <150)
VLDL: 13 mg/dL (ref 0–40)

## 2021-01-15 LAB — TSH: TSH: 1.154 u[IU]/mL (ref 0.350–4.500)

## 2021-01-15 LAB — POC SARS CORONAVIRUS 2 AG: SARSCOV2ONAVIRUS 2 AG: NEGATIVE

## 2021-01-15 MED ORDER — TRAZODONE HCL 50 MG PO TABS: 50.0000 mg | ORAL_TABLET | Freq: Every day | ORAL | Status: DC

## 2021-01-15 MED ORDER — ACETAMINOPHEN 325 MG PO TABS: 650.0000 mg | ORAL_TABLET | Freq: Four times a day (QID) | ORAL | Status: DC | PRN

## 2021-01-15 MED ORDER — ARIPIPRAZOLE 10 MG PO TABS
10.0000 mg | ORAL_TABLET | Freq: Every day | ORAL | Status: DC
  Administered 2021-01-15: 10 mg via ORAL
  Filled 2021-01-15: qty 1

## 2021-01-15 MED ORDER — ALUM & MAG HYDROXIDE-SIMETH 200-200-20 MG/5ML PO SUSP: 30.0000 mL | ORAL | Status: DC | PRN

## 2021-01-15 MED ORDER — MAGNESIUM HYDROXIDE 400 MG/5ML PO SUSP: 30.0000 mL | Freq: Every day | ORAL | Status: DC | PRN

## 2021-01-15 NOTE — Discharge Summary (Signed)
Glennis Brink to be D/C'd Home per NP order. Discussed with the patient and all questions fully answered. An After Visit Summary was printed and given to the patient. Patient escorted out and D/C home via safe transport.  Dickie La  01/15/2021 3:28 PM

## 2021-01-15 NOTE — ED Notes (Signed)
Patient refused Vitals. °
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
## 2021-01-15 NOTE — ED Notes (Signed)
Patient refused Ekg

## 2021-01-15 NOTE — ED Provider Notes (Signed)
Behavioral Health Admission [facility] & OBS)  Date: 01/15/21 Patient Name: Betty Parrish MRN: [id number] Chief Complaint:  Chief Complaint  Patient presents with   Suicidal      Diagnoses:  Final diagnoses:  Schizoaffective disorder, bipolar type ([place])  Suicidal ideation    HPI: Betty Parrish is a 30 y.o. with a history of schizoaffective disorder and cannabis use disorder who presents voluntarily to [facility] with law enforcement due to [place]. Patient is drowsy and has to be awakened multiple times during the assessment. She is oriented x 4. She states that she and her mother were in an argument tonight and that she has been homeless for about 2 hours. States that she lives with her mother. Patient states that she is suicidal with a plan to "Lay down in the street and let a car hit me." Patient reports HI towards "my baby Daddy." She denies any homicidal plan or intent. She reports AH of singing and screaming. She denies visual hallucinations. She does not appear to be responding to internal stimuli. She reports feeling paranoid that people are trying to harm her. She reports daily use of marijuana. She reports occasional use of alcohol  states last use this evening. She is unable to quantify how much alcohol she had tonight.She denies use of other substances. Patient reports that she is followed by [facility]. She states that she doe not receive ACTT services. Patient reports that she is prescribed Abilify 10 mg daily and trazodone 50 mg QHS.   PHQ 2-9:  [place] ED from 04/06/2020 in [facility]  Thoughts that you would be better off dead, or of hurting yourself in some way Several days  [Phreesia 04/06/2020]  PHQ-9 Total Score 12       [place] ED from 01/15/2021 in [facility] ED from 01/03/2021 in [facility] ED from 04/06/2020 in [place] CATEGORY High  Risk No Risk High Risk        Total Time spent with patient: 30 minutes  Musculoskeletal  Strength & Muscle Tone: within normal limits Gait & Station: normal Patient leans: N/A  Psychiatric Specialty Exam  Presentation General Appearance: Appropriate for Environment; Neat  Eye Contact:Fair  Speech:Clear and Coherent; Normal Rate  Speech Volume:Normal  Handedness:Right   Mood and Affect  Mood:Depressed  Affect:Congruent   Thought Process  Thought Processes:Coherent  Descriptions of Associations:Intact  Orientation:Full (Time, Place and Person)  Thought Content:Logical  Diagnosis of Schizophrenia or Schizoaffective disorder in past: Yes  Duration of Psychotic Symptoms: Greater than six months  Hallucinations:Hallucinations: Auditory Description of Auditory Hallucinations: reports hearing singing and screaming  Ideas of Reference:None  Suicidal Thoughts:Suicidal Thoughts: Yes, Active SI Active Intent and/or Plan: With Intent; With Plan; With Means to Carry Out  Homicidal Thoughts:Homicidal Thoughts: Yes, Passive HI Passive Intent and/or Plan: Without Intent; Without Plan   Sensorium  Memory:Immediate Good; Recent Good; Remote Good  Judgment:Fair  Insight:Fair   Executive Functions  Concentration:Fair  Attention Span:Fair  Recall:Good  Fund of Knowledge:Good  Language:Good   Psychomotor Activity  Psychomotor Activity:Psychomotor Activity: Normal   Assets  Assets:Desire for Improvement; Financial Resources/Insurance; Physical Health; Resilience; Social Support   Sleep  Sleep:Sleep: Poor   Nutritional Assessment (For OBS and FBC admissions only) Has the patient had a weight loss or gain of 10 pounds or more in the last 3 months?: No Has the patient had a decrease in  food intake/or appetite?: No Does the patient have dental problems?: No Does the patient have eating habits or behaviors that may be indicators of an eating disorder  including binging or inducing vomiting?: No Has the patient recently lost weight without trying?: 0 Has the patient been eating poorly because of a decreased appetite?: 0 Malnutrition Screening Tool Score: 0   Physical Exam Constitutional:      General: She is not in acute distress.    Appearance: She is not ill-appearing, toxic-appearing or diaphoretic.  HENT:     Head: Normocephalic.     Right Ear: External ear normal.     Left Ear: External ear normal.  Eyes:     Conjunctiva/sclera: Conjunctivae normal.     Pupils: Pupils are equal, round, and reactive to light.  Cardiovascular:     Rate and Rhythm: Normal rate.  Pulmonary:     Effort: Pulmonary effort is normal. No respiratory distress.  Musculoskeletal:        General: Normal range of motion.  Skin:    General: Skin is warm and dry.  Neurological:     Mental Status: She is alert and oriented to person, place, and time.  Psychiatric:        Mood and Affect: Mood is anxious and depressed.        Thought Content: Thought content is not paranoid or delusional. Thought content includes suicidal ideation. Thought content includes suicidal plan.   Review of Systems  Constitutional:  Negative for chills, diaphoresis, fever, malaise/fatigue and weight loss.  HENT:  Negative for congestion.   Respiratory:  Negative for cough and shortness of breath.   Cardiovascular:  Negative for chest pain and palpitations.  Gastrointestinal:  Negative for diarrhea, nausea and vomiting.  Neurological:  Negative for dizziness and seizures.  Psychiatric/Behavioral:  Positive for depression, hallucinations, substance abuse and suicidal ideas. Negative for memory loss. The patient is nervous/anxious and has insomnia.   All other systems reviewed and are negative.  Blood pressure 131/84, pulse 72, temperature 98.7 F (37.1 C), temperature source Oral, resp. rate 16, SpO2 100 %. There is no height or weight on file to calculate BMI.  Past  Psychiatric History: Schizoaffective Disorder-Bipolar Type, Cannabis use  Is the patient at risk to self? Yes  Has the patient been a risk to self in the past 6 months? No .    Has the patient been a risk to self within the distant past? Yes   Is the patient a risk to others? No   Has the patient been a risk to others in the past 6 months? No   Has the patient been a risk to others within the distant past? No   Past Medical History:  Past Medical History:  Diagnosis Date   ADHD (attention deficit hyperactivity disorder)    Anxiety    Heart murmur    Herpes    Schizo-affective psychosis (HCC)    Sickle cell trait (HCC)    No past surgical history on file.  Family History:  Family History  Problem Relation Age of Onset   Sickle cell anemia Mother     Social History:  Social History   Socioeconomic History   Marital status: Significant Other    Spouse name: Not on file   Number of children: Not on file   Years of education: Not on file   Highest education level: Not on file  Occupational History   Not on file  Tobacco Use   Smoking  status: Every Day    Packs/day: 1.00    Types: Cigarettes   Smokeless tobacco: Never  Vaping Use   Vaping Use: Never used  Substance and Sexual Activity   Alcohol use: No   Drug use: Yes    Types: Marijuana, Cocaine    Comment: last used marijuana yesterday, denies cocaine use   Sexual activity: Yes    Birth control/protection: I.U.D.  Other Topics Concern   Not on file  Social History Narrative   Not on file   Social Determinants of Health   Financial Resource Strain: Not on file  Food Insecurity: Not on file  Transportation Needs: Not on file  Physical Activity: Not on file  Stress: Not on file  Social Connections: Not on file  Intimate Partner Violence: Not on file    SDOH:  SDOH Screenings   Alcohol Screen: Not on file  Depression (PHQ2-9): Medium Risk   PHQ-2 Score: 12  Financial Resource Strain: Not on file  Food  Insecurity: Not on file  Housing: Not on file  Physical Activity: Not on file  Social Connections: Not on file  Stress: Not on file  Tobacco Use: High Risk   Smoking Tobacco Use: Every Day   Smokeless Tobacco Use: Never   Passive Exposure: Not on file  Transportation Needs: Not on file    Last Labs:  Admission on 01/15/2021  Component Date Value Ref Range Status   POC Amphetamine UR 01/15/2021 None Detected  NONE DETECTED (Cut Off Level 1000 ng/mL) Preliminary   POC Secobarbital (BAR) 01/15/2021 None Detected  NONE DETECTED (Cut Off Level 300 ng/mL) Preliminary   POC Buprenorphine (BUP) 01/15/2021 None Detected  NONE DETECTED (Cut Off Level 10 ng/mL) Preliminary   POC Oxazepam (BZO) 01/15/2021 None Detected  NONE DETECTED (Cut Off Level 300 ng/mL) Preliminary   POC Cocaine UR 01/15/2021 None Detected  NONE DETECTED (Cut Off Level 300 ng/mL) Preliminary   POC Methamphetamine UR 01/15/2021 None Detected  NONE DETECTED (Cut Off Level 1000 ng/mL) Preliminary   POC Morphine 01/15/2021 None Detected  NONE DETECTED (Cut Off Level 300 ng/mL) Preliminary   POC Oxycodone UR 01/15/2021 None Detected  NONE DETECTED (Cut Off Level 100 ng/mL) Preliminary   POC Methadone UR 01/15/2021 None Detected  NONE DETECTED (Cut Off Level 300 ng/mL) Preliminary   POC Marijuana UR 01/15/2021 Positive (A)  NONE DETECTED (Cut Off Level 50 ng/mL) Preliminary   SARSCOV2ONAVIRUS 2 AG 01/15/2021 NEGATIVE  NEGATIVE Final   Comment: (NOTE) SARS-CoV-2 antigen NOT DETECTED.   Negative results are presumptive.  Negative results do not preclude SARS-CoV-2 infection and should not be used as the sole basis for treatment or other patient management decisions, including infection  control decisions, particularly in the presence of clinical signs and  symptoms consistent with COVID-19, or in those who have been in contact with the virus.  Negative results must be combined with clinical observations, patient history, and  epidemiological information. The expected result is Negative.  Fact Sheet for Patients: HandmadeRecipes.com.cy  Fact Sheet for Healthcare Providers: FuneralLife.at  This test is not yet approved or cleared by the [place] FDA and  has been authorized for detection and/or diagnosis of SARS-CoV-2 by FDA under an Emergency Use Authorization (EUA).  This EUA will remain in effect (meaning this test can be used) for the duration of  the COV  ID-19 declaration under Section 564(b)(1) of the Act, 21 U.S.C. section 360bbb-3(b)(1), unless the authorization is terminated or revoked sooner.     Preg Test, Ur 01/15/2021 NEGATIVE  NEGATIVE Final   Comment:        THE SENSITIVITY OF THIS METHODOLOGY IS >24 mIU/mL     Allergies: Red dye and Zyprexa [olanzapine]  PTA Medications:  Current Outpatient Medications  Medication Instructions   ARIPiprazole (ABILIFY) 10 mg, Oral, Daily   traZODone (DESYREL) 50 mg, Oral, Daily at bedtime      Medical Decision Making  Admit to continuous assessment for crisis stabilization  Continue home medications  Scheduled Meds:  ARIPiprazole  10 mg Oral Daily   traZODone  50 mg Oral QHS   PRN Meds:.acetaminophen, alum & mag hydroxide-simeth, magnesium hydroxide   Lab Orders         Resp Panel by RT-PCR (Flu A&B, Covid) Nasopharyngeal Swab         CBC with Differential/Platelet         Comprehensive metabolic panel         Hemoglobin A1c         Ethanol         Lipid panel         TSH         Pregnancy, urine         POC SARS Coronavirus 2 Ag-ED - Nasal Swab         POCT Urine Drug Screen - ([facility])         POC SARS Coronavirus 2 Ag         Pregnancy, urine POC        Clinical Course as of 01/15/21 0337  Tue Jan 15, 2021  0321 POCT Urine Drug Screen - ([facility])(!) UDS positive for marijuana [JB]  0337 Preg Test, Ur: NEGATIVE [JB]    Clinical Course User Index [JB]  Rozetta Nunnery, NP    Recommendations  Based on my evaluation the patient does not appear to have an emergency medical condition.  Rozetta Nunnery, NP 01/15/21  3:37 AM

## 2021-01-15 NOTE — ED Provider Notes (Signed)
FBC/OBS ASAP Discharge Summary  Date and Time: 01/15/2021 1:35 PM  Name: Betty Parrish  MRN:  [id number]   Discharge Diagnoses:  Final diagnoses:  Schizoaffective disorder, bipolar type ([name])  Suicidal ideation    Subjective: "I'm fine"  Betty Parrish, 30 y.o., female patient with history of schizoaffective disorder and cannabis use disorder presented to [facility] via law enforcement with complaints of suicidal ideation after and argument with her mother. Betty Parrish seen face to face by this provider, consulted with Dr. Ernie Hew; and chart reviewed on 01/15/21.  On evaluation [name] reports she is fine.  States she came in after getting into a fight with her mother last night.  States she hasn't spoken to her mother today, but they usually work things out the next day.  Patient denies suicidal/self-harm/homicidal ideation, psychosis, and paranoia.  Patient states she has outpatient psychiatric services with [facility].  States she is fine and just wants to go home.  States she would need assistance with transportation to get home.  Patient aware that she was positive for influenza other than feeling tired; states she has no symptoms.  Patient also states she is suppose to return to work tomorrow.    During evaluation Mileydi Deger is lay in bed in no acute distress.  She is alert, oriented x 4, calm, cooperative and attentive.  Her mood is euthymic with congruent affect.  She has normal speech, and behavior.  Objectively there is no evidence of psychosis/mania or delusional thinking.  Patient is able to converse coherently, goal directed thoughts, no distractibility, or pre-occupation.  She also denies suicidal/self-harm/homicidal ideation, psychosis, and paranoia.  Patient answered question appropriately.     Stay Summary: [name] was admitted to [facility] Continuous Assessment unit for Schizoaffective disorder, bipolar type [facility]) and crisis management.  Home medications restarted.   Patient will be discharged to resume home medications.  No prescriptions given.  [name]'s improvement was monitored by continuous assessment/observation and her report of symptom reduction.  Her emotional and mental status was also monitored by staff.           [name] was evaluated for stability and plans for continued recovery upon discharge.  [name] motivation was an integral factor for scheduling further treatment.  The following was addressed as part of her discharge planning and follow up treatment:  Employment, housing, transportation, bed availability, health status, family support, and any pending legal issues.  Patient will follow up with [facility].   Upon completion of this admission the [name] was both mentally and medically stable for discharge denying suicidal/homicidal ideation, auditory/visual/tactile hallucinations, delusional thoughts and paranoia.     Total Time spent with patient: 30 minutes  Past Psychiatric History: See above Past Medical History:  Past Medical History:  Diagnosis Date   ADHD (attention deficit hyperactivity disorder)    Anxiety    Heart murmur    Herpes    Schizo-affective psychosis ([place])    Sickle cell trait ([place])    History reviewed. No pertinent surgical history. Family History:  Family History  Problem Relation Age of Onset   Sickle cell anemia Mother    Family Psychiatric History: Unaware Social History:  Social History   Substance and Sexual Activity  Alcohol Use No     Social History   Substance and Sexual Activity  Drug Use Yes   Types: Marijuana, Cocaine   Comment: last used marijuana yesterday, denies cocaine use  Social History   Socioeconomic History   Marital status: Significant Other    Spouse name: Not on file   Number of children: Not on file   Years of education: Not on file   Highest education level: Not on file  Occupational History   Not on file  Tobacco Use   Smoking status: Every Day     Packs/day: 1.00    Types: Cigarettes   Smokeless tobacco: Never  Vaping Use   Vaping Use: Never used  Substance and Sexual Activity   Alcohol use: No   Drug use: Yes    Types: Marijuana, Cocaine    Comment: last used marijuana yesterday, denies cocaine use   Sexual activity: Yes    Birth control/protection: I.U.D.  Other Topics Concern   Not on file  Social History Narrative   Not on file   Social Determinants of Health   Financial Resource Strain: Not on file  Food Insecurity: Not on file  Transportation Needs: Not on file  Physical Activity: Not on file  Stress: Not on file  Social Connections: Not on file   SDOH:  SDOH Screenings   Alcohol Screen: Not on file  Depression (PHQ2-9): Medium Risk   PHQ-2 Score: 12  Financial Resource Strain: Not on file  Food Insecurity: Not on file  Housing: Not on file  Physical Activity: Not on file  Social Connections: Not on file  Stress: Not on file  Tobacco Use: High Risk   Smoking Tobacco Use: Every Day   Smokeless Tobacco Use: Never   Passive Exposure: Not on file  Transportation Needs: Not on file    Tobacco Cessation:  A prescription for an FDA-approved tobacco cessation medication was offered at discharge and the patient refused  Current Medications:  Current Facility-Administered Medications  Medication Dose Route Frequency Provider Last Rate Last Admin   acetaminophen (TYLENOL) tablet 650 mg  650 mg Oral Q6H PRN Rozetta Nunnery, NP       alum & mag hydroxide-simeth (MAALOX/MYLANTA) 200-200-20 MG/5ML suspension 30 mL  30 mL Oral Q4H PRN Lindon Romp A, NP       ARIPiprazole (ABILIFY) tablet 10 mg  10 mg Oral Daily Lindon Romp A, NP   10 mg at 01/15/21 0920   magnesium hydroxide (MILK OF MAGNESIA) suspension 30 mL  30 mL Oral Daily PRN Rozetta Nunnery, NP       traZODone (DESYREL) tablet 50 mg  50 mg Oral QHS Lindon Romp A, NP       Current Outpatient Medications  Medication Sig Dispense Refill   ARIPiprazole  (ABILIFY) 10 MG tablet Take 1 tablet (10 mg total) by mouth daily. 30 tablet 0   traZODone (DESYREL) 50 MG tablet Take 1 tablet (50 mg total) by mouth at bedtime. 30 tablet 0    PTA Medications: (Not in a hospital admission)   Musculoskeletal  Strength & Muscle Tone: within normal limits Gait & Station: normal Patient leans: N/A  Psychiatric Specialty Exam  Presentation  General Appearance: Appropriate for Environment  Eye Contact:Good  Speech:Clear and Coherent; Normal Rate  Speech Volume:Normal  Handedness:Right   Mood and Affect  Mood:Euthymic  Affect:Congruent   Thought Process  Thought Processes:Coherent; Goal Directed  Descriptions of Associations:Intact  Orientation:Full (Time, Place and Person)  Thought Content:WDL  Diagnosis of Schizophrenia or Schizoaffective disorder in past: Yes  Duration of Psychotic Symptoms: Greater than six months   Hallucinations:Hallucinations: None Description of Auditory Hallucinations: reports hearing singing and screaming  Ideas of Reference:None  Suicidal Thoughts:Suicidal Thoughts: No SI Active Intent and/or Plan: With Intent; With Plan; With Means to Carry Out  Homicidal Thoughts:Homicidal Thoughts: No HI Passive Intent and/or Plan: Without Intent; Without Plan   Sensorium  Memory:Immediate Good; Recent Good  Judgment:Intact  Insight:Present   Executive Functions  Concentration:Good  Attention Span:Good  Recall:Good  Fund of Knowledge:Good  Language:Good   Psychomotor Activity  Psychomotor Activity:Psychomotor Activity: Normal   Assets  Assets:Communication Skills; Desire for Improvement; Housing; Resilience; Social Support   Sleep  Sleep:Sleep: Good   Nutritional Assessment (For OBS and FBC admissions only) Has the patient had a weight loss or gain of 10 pounds or more in the last 3 months?: No Has the patient had a decrease in food intake/or appetite?: No Does the patient have dental  problems?: No Does the patient have eating habits or behaviors that may be indicators of an eating disorder including binging or inducing vomiting?: No Has the patient recently lost weight without trying?: 0 Has the patient been eating poorly because of a decreased appetite?: 0 Malnutrition Screening Tool Score: 0   Physical Exam  Physical Exam Vitals and nursing note reviewed. Exam conducted with a chaperone present.  Constitutional:      General: She is not in acute distress.    Appearance: Normal appearance. She is obese. She is not ill-appearing.  Cardiovascular:     Rate and Rhythm: Normal rate.  Pulmonary:     Effort: Pulmonary effort is normal.  Musculoskeletal:        General: Normal range of motion.     Cervical back: Normal range of motion.  Skin:    General: Skin is warm and dry.  Neurological:     Mental Status: She is alert and oriented to person, place, and time.  Psychiatric:        Attention and Perception: Attention and perception normal. She does not perceive auditory or visual hallucinations.        Mood and Affect: Mood and affect normal.        Speech: Speech normal.        Behavior: Behavior normal. Behavior is cooperative.        Thought Content: Thought content normal. Thought content is not paranoid or delusional. Thought content does not include homicidal or suicidal ideation.        Cognition and Memory: Cognition and memory normal.        Judgment: Judgment normal.   Review of Systems  Constitutional:  Positive for malaise/fatigue. Negative for chills and diaphoresis.  HENT: Negative.    Eyes: Negative.   Respiratory: Negative.  Negative for cough and shortness of breath.   Cardiovascular: Negative.   Gastrointestinal: Negative.   Genitourinary: Negative.   Musculoskeletal: Negative.   Skin: Negative.   Neurological: Negative.   Endo/Heme/Allergies: Negative.   Psychiatric/Behavioral:  Depression: Stable. Hallucinations: Denies. Suicidal ideas:  Denies. The patient does not have insomnia. Nervous/anxious: Stable.  Blood pressure 131/84, pulse 72, temperature 98.7 F (37.1 C), temperature source Oral, resp. rate 16, SpO2 100 %. There is no height or weight on file to calculate BMI.  Demographic Factors:  Black Female  Loss Factors: NA  Historical Factors: Impulsivity  Risk Reduction Factors:   Responsible for children under 60 years of age, Sense of responsibility to family, Employed, Living with another person, especially a relative, and Positive social support  Continued Clinical Symptoms:  Previous Psychiatric Diagnoses and Treatments  Cognitive Features That Contribute To Risk:  None    Suicide Risk:  Minimal: No identifiable suicidal ideation.  Patients presenting with no risk factors but with morbid ruminations; may be classified as minimal risk based on the severity of the depressive symptoms  Plan Of Care/Follow-up recommendations:  Activity:  Resume regular activity   Follow-up Information     [facility].   Why: Keep scheduled appointment. Contact information: [address]  [place] [address] [phone]                  Disposition: No evidence of imminent risk to self or others at present.   Patient does not meet criteria for psychiatric inpatient admission. Follow up with [facility]    Jaydence Vanyo, NP 01/15/2021, 1:35 PM
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
## 2021-01-16 ENCOUNTER — Telehealth (HOSPITAL_COMMUNITY): Payer: Self-pay | Admitting: Family Medicine

## 2021-01-16 NOTE — BH Assessment (Signed)
Care Management - Follow Up [facility] Discharges   Writer attempted to make contact with patient today and was unsuccessful.  Writer left a HIPPA compliant voice message.   Per chart review, patient will be following up with his established ACTT Team.

## 2021-01-18 ENCOUNTER — Other Ambulatory Visit: Payer: Self-pay

## 2021-01-18 ENCOUNTER — Encounter (HOSPITAL_COMMUNITY): Payer: Self-pay

## 2021-01-18 ENCOUNTER — Ambulatory Visit (HOSPITAL_COMMUNITY)
Admission: EM | Admit: 2021-01-18 | Discharge: 2021-01-18 | Disposition: A | Discharge status: Home / Self Care | Payer: Medicaid Other | Attending: Urology | Admitting: Urology

## 2021-01-18 ENCOUNTER — Emergency Department (HOSPITAL_COMMUNITY)
Admission: EM | Admit: 2021-01-18 | Discharge: 2021-01-18 | Disposition: A | Discharge status: Home / Self Care | Payer: Medicaid Other | Attending: Emergency Medicine | Admitting: Emergency Medicine

## 2021-01-18 DIAGNOSIS — Z5900 Homelessness unspecified: Secondary | ICD-10-CM | POA: Insufficient documentation

## 2021-01-18 DIAGNOSIS — Z1339 Encounter for screening examination for other mental health and behavioral disorders: Secondary | ICD-10-CM | POA: Diagnosis not present

## 2021-01-18 DIAGNOSIS — Z765 Malingerer [conscious simulation]: Secondary | ICD-10-CM | POA: Diagnosis not present

## 2021-01-18 DIAGNOSIS — F17210 Nicotine dependence, cigarettes, uncomplicated: Secondary | ICD-10-CM | POA: Diagnosis not present

## 2021-01-18 DIAGNOSIS — Z5941 Food insecurity: Secondary | ICD-10-CM | POA: Insufficient documentation

## 2021-01-18 DIAGNOSIS — R45851 Suicidal ideations: Secondary | ICD-10-CM | POA: Insufficient documentation

## 2021-01-18 DIAGNOSIS — F250 Schizoaffective disorder, bipolar type: Secondary | ICD-10-CM | POA: Insufficient documentation

## 2021-01-18 NOTE — ED Provider Notes (Signed)
[facility]-EMERGENCY DEPT Provider Note: Betty Dell, MD, FACEP  CSN: [id number] MRN: [id number] ARRIVAL: 01/18/21 at 0445 ROOM: WA25/WA25   CHIEF COMPLAINT  Psychiatric Evaluation   HISTORY OF PRESENT ILLNESS  01/18/21 5:12 AM Betty Parrish is a 30 y.o. female with chronic psychiatric issues.  She was seen at [facility] at 4:13 AM today initially reporting suicidal ideation without plan.  She was seen by the nurse practitioner there who reports that she was sleepy but readily awakened.  She acknowledged having suicidal ideation but denied suicidal plan or intent.  She subsequently acknowledges she came there just to sleep.  She wanted a place to stay because she had nowhere to go and was hungry.  She requested to be left alone to sleep but also asked for something to eat.  She subsequently denied suicidal ideation, homicidal ideation, hallucinations or paranoia but refused answer questions about substance abuse or medication compliance.  The nurse sugars concluded that based on her evaluation the patient did not appear to have an emergency medical condition and was discharged with instructions for outpatient follow-up.  Patient then came here stating she was told at [facility] that they could not keep her because she had COVID (NB: Patient negative for COVID and influenza on 01/15/2021) and that she was sent here by them for "some kind of test".    Past Medical History:  Diagnosis Date   ADHD (attention deficit hyperactivity disorder)    Anxiety    Heart murmur    Herpes    Schizo-affective psychosis (HCC)    Sickle cell trait (HCC)     History reviewed. No pertinent surgical history.  Family History  Problem Relation Age of Onset   Sickle cell anemia Mother     Social History   Tobacco Use   Smoking status: Every Day    Packs/day: 1.00    Types: Cigarettes   Smokeless tobacco: Never  Vaping Use   Vaping Use: Never used  Substance Use Topics   Alcohol use: No   Drug use: Yes    Types:  Marijuana, Cocaine    Comment: last used marijuana yesterday, denies cocaine use    Prior to Admission medications   Medication Sig Start Date End Date Taking? Authorizing Provider  ARIPiprazole (ABILIFY) 10 MG tablet Take 1 tablet (10 mg total) by mouth daily. 01/03/21   Lenard Lance, FNP  traZODone (DESYREL) 50 MG tablet Take 1 tablet (50 mg total) by mouth at bedtime. 01/03/21   Lenard Lance, FNP    Allergies Red dye and Zyprexa [olanzapine]   REVIEW OF SYSTEMS  Negative except as noted here or in the History of Present Illness.   PHYSICAL EXAMINATION  Initial Vital Signs Blood pressure 139/81, pulse 68, temperature 98.7 F (37.1 C), temperature source Oral, resp. rate 16, height 5' 8.5" (1.74 m), weight 96.6 kg, SpO2 100 %.  Examination General: Well-developed, well-nourished female in no acute distress; appearance consistent with age of record HENT: normocephalic; atraumatic; no nasal congestion Eyes: Normal appearance Neck: supple Heart: regular rate and rhythm Lungs: clear to auscultation bilaterally Abdomen: soft; nondistended; nontender; bowel sounds present Extremities: No deformity; full range of motion; pulses normal Neurologic: Sleeping but readily awakened; motor function intact in all extremities and symmetric; no facial droop Skin: Warm and dry Psychiatric: Patient more interested in sleeping than in carrying on a conversation   RESULTS  Summary of this visit's results, reviewed and interpreted by myself:   EKG Interpretation  Date/Time:  Ventricular Rate:    PR Interval:    QRS Duration:   QT Interval:    QTC Calculation:   R Axis:     Text Interpretation:         Laboratory Studies: No results found for this or any previous visit (from the past 24 hour(s)). Imaging Studies: No results found.  ED COURSE and MDM  Nursing notes, initial and subsequent vitals signs, including pulse oximetry, reviewed and interpreted by myself.  Vitals:    01/18/21 0450  BP: 139/81  Pulse: 68  Resp: 16  Temp: 98.7 F (37.1 C)  TempSrc: Oral  SpO2: 100%  Weight: 96.6 kg  Height: 5' 8.5" (1.74 m)   Medications - No data to display  The patient is either confused or prevaricating about being told she was refused from [facility] because of [place].  She was in fact assessed there and discharged appropriately.  She is sleepy and appears to be either sleep deprived or under the influence of some drug of abuse.  PROCEDURES  Procedures   ED DIAGNOSES     ICD-10-CM   1. Malingering  Z76.5          Myliyah Rebuck, MD 01/18/21 [phone]
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
## 2021-01-18 NOTE — BH Assessment (Signed)
Pt is sleeping when clinician and Cecilio Asper, NP attempted to engage her in assessment.  Pt kept putting her head down on the table and dozing off.  Pt would not answer questions and only said "I'm tired."  Ene recommended discharge of patient.  [name] was contacted since they brught her to [facility].

## 2021-01-18 NOTE — ED Triage Notes (Signed)
Pt states that she wants to talk with someone to get help with her psychiatric problems/medications. Pt states that she went to behavioral health but couldn't stay because she has Covid.

## 2021-01-18 NOTE — Discharge Instructions (Addendum)
You are encouraged to follow up with [facility] for outpatient treatment. ° °Walk in/ Open Access Hours: °Monday - Friday 8AM - 11AM (To see provider and therapist) - Arrive around 7 or 7:15 to have a better chance of being seen, as slots fill up.   °Friday - 1PM - 4PM (To see therapist only) ° °[facility] °[address] °[place], [place] °[phone] ° °Discharge recommendations:  °Patient is to take medications as prescribed. °Please see information for follow-up appointment with psychiatry and therapy. °Please follow up with your primary care provider for all medical related needs.  ° °Therapy: We recommend that patient participate in individual therapy to address mental health concerns. ° °Medications: The parent/guardian is to contact a medical professional and/or outpatient provider to address any new side effects that develop. Parent/guardian should update outpatient providers of any new medications and/or medication changes.  ° °Safety:  °The patient should abstain from use of illicit substances/drugs and abuse of any medications. °If symptoms worsen or do not continue to improve or if the patient becomes actively suicidal or homicidal then it is recommended that the patient return to the closest hospital emergency department, the [facility], or call 911 for further evaluation and treatment. °National Suicide Prevention Lifeline 1-800-SUICIDE or 1-800-273-8255. ° °About 988 °988 offers 24/7 access to trained crisis counselors who can help people experiencing mental health-related distress. People can call or text 988 or chat 988lifeline.org for themselves or if they are worried about a loved one who may need crisis support. ° °

## 2021-01-21 ENCOUNTER — Emergency Department (HOSPITAL_COMMUNITY)
Admission: EM | Admit: 2021-01-21 | Discharge: 2021-01-21 | Disposition: A | Discharge status: Home / Self Care | Payer: Medicaid Other | Attending: Emergency Medicine | Admitting: Emergency Medicine

## 2021-01-21 ENCOUNTER — Encounter (HOSPITAL_COMMUNITY): Payer: Self-pay | Admitting: Emergency Medicine

## 2021-01-21 DIAGNOSIS — M791 Myalgia, unspecified site: Secondary | ICD-10-CM | POA: Insufficient documentation

## 2021-01-21 DIAGNOSIS — Z5321 Procedure and treatment not carried out due to patient leaving prior to being seen by health care provider: Secondary | ICD-10-CM | POA: Insufficient documentation

## 2021-01-21 HISTORY — DX: Schizophrenia, unspecified: F20.9
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
## 2021-01-21 NOTE — ED Notes (Signed)
Patient decided to leave stated that she had discharge papers from [facility] and she was going to go by them instructions

## 2021-01-21 NOTE — ED Triage Notes (Signed)
Pt. Stated, I tested positive for the flu on Nov. 8 and Im still having body aches all over.
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
## 2021-01-21 NOTE — ED Triage Notes (Signed)
Pt. Stated, Im on the street and my mother will not let me come home til Im back on my medication

## 2021-01-21 NOTE — ED Triage Notes (Signed)
Pt. Continues to fall asleep, has to be aroused to answer any questions.

## 2021-01-21 NOTE — ED Provider Notes (Signed)
Emergency Medicine Provider Triage Evaluation Note  Betty Parrish , a 30 y.o. female  was evaluated in triage.  Pt complains of myalgias.  States her mom a letter back in the house due to not being on her medication.  He is unsure where she tested positive for influenza her states was a few days ago.  No fever, emesis.  She falls asleep mid conversation.  Denies SI, HI, AVH  Review of Systems  Positive: myalgias Negative:   Physical Exam  BP 134/82   Pulse 77   Temp 98.4 F (36.9 C) (Oral)   Resp 16   SpO2 100%  Gen:   Awake, no distress   Resp:  Normal effort  MSK:   Moves extremities without difficulty  Other:  Denies SI, HI, AVH  Medical Decision Making  Medically screening exam initiated at 7:45 AM.  Appropriate orders placed.  Betty Parrish was informed that the remainder of the evaluation will be completed by another provider, this initial triage assessment does not replace that evaluation, and the importance of remaining in the ED until their evaluation is complete.  Myalgias  Falls asleep mid conversation  VS stable   Amandajo Gonder A, PA-C 01/21/21 0746    Wynetta Fines, MD 01/22/21 [phone]

## 2021-01-23 ENCOUNTER — Telehealth (HOSPITAL_COMMUNITY): Payer: Self-pay | Admitting: Family Medicine

## 2021-01-23 NOTE — BH Assessment (Signed)
Care Management - Follow Up Discharges   Writer attempted to make contact with patient today and was unsuccessful.  Writer left a HIPPA compliant voice message.
